# Patient Record
Sex: Female | Born: 1960 | Race: White | Hispanic: No | Marital: Married | Smoking: Former smoker
Health system: Southern US, Community
[De-identification: ages and names within clinical notes are randomized; demographics above are authoritative.]

## PROBLEM LIST (undated history)

## (undated) DIAGNOSIS — F419 Anxiety disorder, unspecified: Secondary | ICD-10-CM

## (undated) DIAGNOSIS — F32A Anxiety disorder, unspecified: Secondary | ICD-10-CM

## (undated) DIAGNOSIS — G4730 Sleep apnea, unspecified: Secondary | ICD-10-CM

## (undated) DIAGNOSIS — I3139 Other pericardial effusion (noninflammatory): Secondary | ICD-10-CM

## (undated) DIAGNOSIS — T7840XA Allergy, unspecified, initial encounter: Secondary | ICD-10-CM

## (undated) DIAGNOSIS — K591 Functional diarrhea: Secondary | ICD-10-CM

## (undated) DIAGNOSIS — E041 Nontoxic single thyroid nodule: Secondary | ICD-10-CM

## (undated) DIAGNOSIS — E669 Obesity, unspecified: Secondary | ICD-10-CM

## (undated) DIAGNOSIS — G43909 Migraine, unspecified, not intractable, without status migrainosus: Secondary | ICD-10-CM

## (undated) DIAGNOSIS — Z9889 Other specified postprocedural states: Secondary | ICD-10-CM

## (undated) DIAGNOSIS — J449 Chronic obstructive pulmonary disease, unspecified: Secondary | ICD-10-CM

## (undated) DIAGNOSIS — K589 Irritable bowel syndrome without diarrhea: Secondary | ICD-10-CM

## (undated) DIAGNOSIS — S76309A Unspecified injury of muscle, fascia and tendon of the posterior muscle group at thigh level, unspecified thigh, initial encounter: Secondary | ICD-10-CM

## (undated) DIAGNOSIS — R112 Nausea with vomiting, unspecified: Secondary | ICD-10-CM

## (undated) DIAGNOSIS — F329 Major depressive disorder, single episode, unspecified: Secondary | ICD-10-CM

## (undated) DIAGNOSIS — M5126 Other intervertebral disc displacement, lumbar region: Secondary | ICD-10-CM

## (undated) DIAGNOSIS — G709 Myoneural disorder, unspecified: Secondary | ICD-10-CM

## (undated) DIAGNOSIS — R918 Other nonspecific abnormal finding of lung field: Secondary | ICD-10-CM

## (undated) DIAGNOSIS — Z8709 Personal history of other diseases of the respiratory system: Secondary | ICD-10-CM

## (undated) DIAGNOSIS — G629 Polyneuropathy, unspecified: Secondary | ICD-10-CM

## (undated) DIAGNOSIS — Z860100 Personal history of colon polyps, unspecified: Secondary | ICD-10-CM

## (undated) DIAGNOSIS — F909 Attention-deficit hyperactivity disorder, unspecified type: Secondary | ICD-10-CM

## (undated) DIAGNOSIS — K219 Gastro-esophageal reflux disease without esophagitis: Secondary | ICD-10-CM

## (undated) DIAGNOSIS — E785 Hyperlipidemia, unspecified: Secondary | ICD-10-CM

## (undated) DIAGNOSIS — K5790 Diverticulosis of intestine, part unspecified, without perforation or abscess without bleeding: Secondary | ICD-10-CM

## (undated) DIAGNOSIS — R1312 Dysphagia, oropharyngeal phase: Secondary | ICD-10-CM

## (undated) DIAGNOSIS — E119 Type 2 diabetes mellitus without complications: Secondary | ICD-10-CM

## (undated) DIAGNOSIS — R159 Full incontinence of feces: Secondary | ICD-10-CM

## (undated) DIAGNOSIS — F410 Panic disorder [episodic paroxysmal anxiety] without agoraphobia: Secondary | ICD-10-CM

## (undated) DIAGNOSIS — D869 Sarcoidosis, unspecified: Secondary | ICD-10-CM

## (undated) DIAGNOSIS — J8410 Pulmonary fibrosis, unspecified: Secondary | ICD-10-CM

## (undated) DIAGNOSIS — F4310 Post-traumatic stress disorder, unspecified: Secondary | ICD-10-CM

## (undated) DIAGNOSIS — G2581 Restless legs syndrome: Secondary | ICD-10-CM

## (undated) DIAGNOSIS — Z800 Family history of malignant neoplasm of digestive organs: Secondary | ICD-10-CM

## (undated) HISTORY — DX: Personal history of colon polyps, unspecified: Z86.0100

## (undated) HISTORY — PX: BREAST EXCISIONAL BIOPSY: SUR124

## (undated) HISTORY — DX: Type 2 diabetes mellitus without complications: E11.9

## (undated) HISTORY — DX: Pulmonary fibrosis, unspecified: J84.10

## (undated) HISTORY — DX: Functional diarrhea: K59.1

## (undated) HISTORY — DX: Other intervertebral disc displacement, lumbar region: M51.26

## (undated) HISTORY — DX: Full incontinence of feces: R15.9

## (undated) HISTORY — DX: Irritable bowel syndrome, unspecified: K58.9

## (undated) HISTORY — DX: Family history of malignant neoplasm of digestive organs: Z80.0

## (undated) HISTORY — DX: Anxiety disorder, unspecified: F32.A

## (undated) HISTORY — DX: Unspecified injury of muscle, fascia and tendon of the posterior muscle group at thigh level, unspecified thigh, initial encounter: S76.309A

## (undated) HISTORY — DX: Obesity, unspecified: E66.9

## (undated) HISTORY — DX: Gastro-esophageal reflux disease without esophagitis: K21.9

## (undated) HISTORY — PX: CHOLECYSTECTOMY: SHX55

## (undated) HISTORY — PX: TONSILLECTOMY: SUR1361

## (undated) HISTORY — DX: Personal history of other diseases of the respiratory system: Z87.09

## (undated) HISTORY — DX: Diverticulosis of intestine, part unspecified, without perforation or abscess without bleeding: K57.90

## (undated) HISTORY — DX: Chronic obstructive pulmonary disease, unspecified: J44.9

## (undated) HISTORY — DX: Restless legs syndrome: G25.81

## (undated) HISTORY — DX: Sarcoidosis, unspecified: D86.9

## (undated) HISTORY — PX: PELVIC FLOOR REPAIR: SHX2192

## (undated) HISTORY — DX: Hyperlipidemia, unspecified: E78.5

## (undated) HISTORY — DX: Attention-deficit hyperactivity disorder, unspecified type: F90.9

## (undated) HISTORY — DX: Panic disorder (episodic paroxysmal anxiety): F41.0

## (undated) HISTORY — DX: Anxiety disorder, unspecified: F41.9

## (undated) HISTORY — DX: Post-traumatic stress disorder, unspecified: F43.10

## (undated) HISTORY — DX: Polyneuropathy, unspecified: G62.9

## (undated) HISTORY — PX: KNEE ARTHROSCOPY: SUR90

## (undated) HISTORY — DX: Sleep apnea, unspecified: G47.30

## (undated) HISTORY — DX: Other pericardial effusion (noninflammatory): I31.39

## (undated) HISTORY — DX: Dysphagia, oropharyngeal phase: R13.12

## (undated) HISTORY — DX: Allergy, unspecified, initial encounter: T78.40XA

## (undated) HISTORY — DX: Other nonspecific abnormal finding of lung field: R91.8

## (undated) HISTORY — PX: TUBAL LIGATION: SHX77

## (undated) HISTORY — DX: Nontoxic single thyroid nodule: E04.1

## (undated) HISTORY — DX: Migraine, unspecified, not intractable, without status migrainosus: G43.909

---
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
## 1987-12-29 HISTORY — PX: OTHER SURGICAL HISTORY: SHX169

## 1988-12-28 DIAGNOSIS — F411 Generalized anxiety disorder: Secondary | ICD-10-CM | POA: Insufficient documentation

## 1988-12-28 HISTORY — DX: Generalized anxiety disorder: F41.1

## 1990-12-28 HISTORY — PX: TOTAL ABDOMINAL HYSTERECTOMY: SHX209

## 1998-06-13 ENCOUNTER — Other Ambulatory Visit: Admission: RE | Admit: 1998-06-13 | Discharge: 1998-06-13 | Payer: Self-pay | Admitting: Obstetrics and Gynecology

## 2008-04-20 ENCOUNTER — Ambulatory Visit: Payer: Self-pay | Admitting: Critical Care Medicine

## 2008-04-20 DIAGNOSIS — F410 Panic disorder [episodic paroxysmal anxiety] without agoraphobia: Secondary | ICD-10-CM | POA: Insufficient documentation

## 2008-04-20 DIAGNOSIS — K219 Gastro-esophageal reflux disease without esophagitis: Secondary | ICD-10-CM | POA: Insufficient documentation

## 2008-04-20 DIAGNOSIS — J45909 Unspecified asthma, uncomplicated: Secondary | ICD-10-CM

## 2008-04-20 DIAGNOSIS — D860 Sarcoidosis of lung: Secondary | ICD-10-CM | POA: Insufficient documentation

## 2008-04-20 DIAGNOSIS — J984 Other disorders of lung: Secondary | ICD-10-CM

## 2008-04-20 DIAGNOSIS — E785 Hyperlipidemia, unspecified: Secondary | ICD-10-CM

## 2008-04-20 DIAGNOSIS — G4734 Idiopathic sleep related nonobstructive alveolar hypoventilation: Secondary | ICD-10-CM

## 2008-04-20 DIAGNOSIS — D869 Sarcoidosis, unspecified: Secondary | ICD-10-CM | POA: Insufficient documentation

## 2008-04-20 HISTORY — DX: Gastro-esophageal reflux disease without esophagitis: K21.9

## 2008-04-20 HISTORY — DX: Unspecified asthma, uncomplicated: J45.909

## 2008-04-20 HISTORY — DX: Idiopathic sleep related nonobstructive alveolar hypoventilation: G47.34

## 2008-04-20 HISTORY — DX: Hyperlipidemia, unspecified: E78.5

## 2008-04-20 HISTORY — DX: Other disorders of lung: J98.4

## 2008-04-20 LAB — CONVERTED CEMR LAB: Angiotensin 1 Converting Enzyme: 37 units/L (ref 9–67)

## 2008-04-25 ENCOUNTER — Encounter: Payer: Self-pay | Admitting: Critical Care Medicine

## 2008-04-25 ENCOUNTER — Ambulatory Visit: Admission: RE | Admit: 2008-04-25 | Discharge: 2008-04-25 | Payer: Self-pay | Admitting: Critical Care Medicine

## 2008-04-25 ENCOUNTER — Ambulatory Visit: Payer: Self-pay | Admitting: Critical Care Medicine

## 2008-04-26 ENCOUNTER — Ambulatory Visit: Payer: Self-pay | Admitting: Internal Medicine

## 2008-04-26 ENCOUNTER — Telehealth (INDEPENDENT_AMBULATORY_CARE_PROVIDER_SITE_OTHER): Payer: Self-pay | Admitting: *Deleted

## 2008-04-26 ENCOUNTER — Ambulatory Visit: Payer: Self-pay | Admitting: Critical Care Medicine

## 2008-04-26 DIAGNOSIS — R059 Cough, unspecified: Secondary | ICD-10-CM | POA: Insufficient documentation

## 2008-04-26 DIAGNOSIS — R05 Cough: Secondary | ICD-10-CM

## 2008-04-26 HISTORY — DX: Cough, unspecified: R05.9

## 2008-04-27 ENCOUNTER — Encounter: Payer: Self-pay | Admitting: Critical Care Medicine

## 2008-04-28 ENCOUNTER — Telehealth: Payer: Self-pay | Admitting: Pulmonary Disease

## 2008-05-16 ENCOUNTER — Ambulatory Visit: Payer: Self-pay | Admitting: Critical Care Medicine

## 2008-05-16 DIAGNOSIS — J64 Unspecified pneumoconiosis: Secondary | ICD-10-CM | POA: Insufficient documentation

## 2008-05-16 HISTORY — DX: Unspecified pneumoconiosis: J64

## 2008-06-11 ENCOUNTER — Ambulatory Visit: Payer: Self-pay | Admitting: Internal Medicine

## 2008-06-11 DIAGNOSIS — J309 Allergic rhinitis, unspecified: Secondary | ICD-10-CM | POA: Insufficient documentation

## 2008-06-11 HISTORY — DX: Allergic rhinitis, unspecified: J30.9

## 2008-07-25 HISTORY — PX: ESOPHAGOGASTRODUODENOSCOPY: SHX1529

## 2008-08-28 ENCOUNTER — Ambulatory Visit: Payer: Self-pay | Admitting: Internal Medicine

## 2008-09-17 ENCOUNTER — Telehealth (INDEPENDENT_AMBULATORY_CARE_PROVIDER_SITE_OTHER): Payer: Self-pay | Admitting: *Deleted

## 2008-10-15 ENCOUNTER — Ambulatory Visit (HOSPITAL_BASED_OUTPATIENT_CLINIC_OR_DEPARTMENT_OTHER): Admission: RE | Admit: 2008-10-15 | Discharge: 2008-10-15 | Payer: Self-pay | Admitting: Internal Medicine

## 2008-10-15 ENCOUNTER — Encounter: Payer: Self-pay | Admitting: Internal Medicine

## 2008-10-20 ENCOUNTER — Ambulatory Visit: Payer: Self-pay | Admitting: Internal Medicine

## 2008-10-31 ENCOUNTER — Ambulatory Visit: Payer: Self-pay | Admitting: Internal Medicine

## 2008-11-05 ENCOUNTER — Telehealth: Payer: Self-pay | Admitting: Internal Medicine

## 2008-11-11 DIAGNOSIS — G4730 Sleep apnea, unspecified: Secondary | ICD-10-CM | POA: Insufficient documentation

## 2008-11-11 HISTORY — DX: Sleep apnea, unspecified: G47.30

## 2008-12-07 ENCOUNTER — Encounter: Payer: Self-pay | Admitting: Internal Medicine

## 2008-12-19 ENCOUNTER — Encounter: Payer: Self-pay | Admitting: Internal Medicine

## 2009-01-13 ENCOUNTER — Encounter: Payer: Self-pay | Admitting: Internal Medicine

## 2009-04-30 ENCOUNTER — Ambulatory Visit: Payer: Self-pay | Admitting: Internal Medicine

## 2009-07-11 ENCOUNTER — Telehealth (INDEPENDENT_AMBULATORY_CARE_PROVIDER_SITE_OTHER): Payer: Self-pay | Admitting: *Deleted

## 2009-07-16 ENCOUNTER — Ambulatory Visit: Payer: Self-pay | Admitting: Internal Medicine

## 2010-05-10 ENCOUNTER — Encounter: Payer: Self-pay | Admitting: Internal Medicine

## 2010-07-01 ENCOUNTER — Telehealth (INDEPENDENT_AMBULATORY_CARE_PROVIDER_SITE_OTHER): Payer: Self-pay | Admitting: *Deleted

## 2010-11-28 ENCOUNTER — Encounter: Payer: Self-pay | Admitting: Internal Medicine

## 2010-12-10 ENCOUNTER — Encounter: Payer: Self-pay | Admitting: Internal Medicine

## 2010-12-15 ENCOUNTER — Encounter: Payer: Self-pay | Admitting: Internal Medicine

## 2010-12-25 ENCOUNTER — Ambulatory Visit: Admit: 2010-12-25 | Payer: Self-pay | Attending: Internal Medicine | Admitting: Internal Medicine

## 2011-01-29 NOTE — Progress Notes (Signed)
Summary: results - LMTCB x1  Phone Note From Other Clinic Call back at [phone]   Caller: Dr. Terrilee Croak Office - [facility]ash Call For: young Summary of Call: looking for path report from biopsy or bronch.  Please fax to # [phone] Initial call taken by: Eugene Gavia,  July 01, 2010 12:00 PM  Follow-up for Phone Call        last path report in echart is from 2009.  is this what she's requesting.  LM at Dr. Terrilee Croak office TCB. Boone Master CNA/MA  July 01, 2010 12:31 PM   Additional Follow-up for Phone Call Additional follow up Details #1::        path report from 2009 bronch faxed to donna at dr chodri's office--fax #[phone] Additional Follow-up by: Philipp Deputy CMA,  July 02, 2010 11:26 AM

## 2011-01-29 NOTE — Miscellaneous (Signed)
Summary: Routine Allergy Skin Test/[facility]  Routine Allergy Skin Test/[facility]   Imported By: Esmeralda Links D'jimraou 06/26/2008 11:09:56  _____________________________________________________________________  External Attachment:    Type:   Image     Comment:   External Document

## 2011-01-29 NOTE — Miscellaneous (Signed)
Summary: Bronchoscopy  Clinical Lists Changes  Observations: Added new observation of BRONCHOSCOPY: Essentially normal upper airway and lower airway exam.  (04/25/2008 16:14)       Bronchoscopy  Procedure date:  04/25/2008  Findings:      Essentially normal upper airway and lower airway exam.   Comments:      Specimen - Transbronchial Biopsy : Location:[facility]  Location:[facility] FINAL DIAGNOSIS   ***MICROSCOPIC EXAMINATION AND DIAGNOSIS***   LUNG, RMLNRUL, TRANSBRONCHIAL BIOPSIES:  - BENIGN LUNG PARENCHYMA WITH GRANULOMATOUS INFLAMMATION.  - THERE IS NO EVIDENCE OF MALIGNANCY. - SEE COMMENT.   COMMENT The specimen contains multiple biopsy fragments of benign lung parenchyma with noncaseating granulomatous inflammation within the interstitium.  GMS, PAS fungal, and AFB stains are negative for the presence of microorganisms.  The histologic differential diagnosis includes pneumoconiosis and less likely sarcoidosis.  Clinical and radiologic correlation is recommended. (JBK:mj 04/26/08)   Appended Document: Bronchoscopy pt aware of results.  All micro data neg to date.  AFB and fungal stains are neg.  Cults are pending.  I called in prednisone 10mg  Take 4 by moutth daily for 4 days then 3 by mouth daily for 4 days then 2 by mouth daily for 4 days then 1 by mouth daily for 4 days then stop

## 2011-01-29 NOTE — Assessment & Plan Note (Signed)
Summary: follow up///JJ   Copy to:  Jonni Sanger Primary Provider/Referring Provider:  Dr. Joetta Manners  CC:  Pt is here for a f/u appt.  Pt states she is doing good on cpap.  pt c/o increased sob and coughing up "thick clear sputum."  .  History of Present Illness:  10/31/08- Allergic rhinitis, asthma, sleep apnea. 1)Comes to f/u NPSG 10/15/08- mild OSA, 7/hrDr. Jennelle Human changed Concerta to Nuvigil 150 mg qd.. Naps. Works weekends, nights rotation.45-60 min daily. Educated sleep hygiene. 2)Rhinitis- Singulair restarted for Fall season. Effective and doing well.  04/30/09- Allergic rhinitis, asthma. OSA  ped cough. still coughing some but clear.Using Flonase. Also started  Allegra- we discussed comparison antinhistamines.  [place] Flow around 450 OSA- Still on Auto but was titrated to 10.  Aware of reflux.  07/16/09 Allergic rhinitis, asthma, OSA OSA- We changed CPAP to fixed at 10. Snores through CPAP, waking tired  Mask leaking. sleeps on side.  Asthma- We dropped Advair  back to 250. Notes more cough, frothy sputm. using rescue inhaler 1-3 x/day/      Current Medications (verified): 1)  Protonix 40 Mg  Tbec (Pantoprazole Sodium) .Marland Kitchen.. 1 By Mouth Daily 2)  Proair Hfa 108 (90 Base) Mcg/act  Aers (Albuterol Sulfate) .Marland Kitchen.. 1-2 Puffs Every 4-6 Hours As Needed Sob 3)  Advair Diskus 250-50 Mcg/dose Misc (Fluticasone-Salmeterol) .... Inhale 1 Puff Two Times A Day 4)  Lamictal 100 Mg  Tabs (Lamotrigine) .... 2 and 1/2 Tabs By Mouth Daily 5)  Multivitamins   Tabs (Multiple Vitamin) .Marland Kitchen.. 1 By Mouth Daily 6)  Flonase 50 Mcg/act  Susp (Fluticasone Propionate) .... Two Puffs Each Nostril Daily As Needed 7)  Allegra 180 Mg Tabs (Fexofenadine Hcl) .... Take 1 By Mouth Once Daily 8)  Cpap 10 Gentiva 9)  Citalopram Hydrobromide 20 Mg Tabs (Citalopram Hydrobromide) .... Take 1 Tablet By Mouth Once A Day 10)  Intuniv 2 Mg Xr24h-Tab (Guanfacine Hcl) .... Take 1 Tablet By Mouth Once A Day 11)  Medrol (Pak) 4 Mg  Tabs (Methylprednisolone) .... Take As Directed 12)  Vitamin D 1000 Unit Tabs (Cholecalciferol) .... Take 1 Tablet By Mouth Once A Day  Allergies (verified): 1)  ! Oxycodone Hcl  Past History:  Past Surgical History: Last updated: 04/20/2008 breast bx benign 1985/2006 Total Abdominal Hysterectomy 1992 Knee Arthroscopy times 3 Tonsillectomy Cholecystectomy  Family History: Last updated: 09/18/2008 Family History COPD father and mother Family History Asthma  two sisters Family History Sleep Apnea- sister mother alive age 5--arthritis, anxiety, chronic depression father alive age 39--cardiomegaly,etoh abuse 1 sibling alive age 24--HBP, morbid obestiy 1 sibling alive age 13--seasonal allergies, depression 1 sibling alive age 57--HBP, hyperchol 1 sibling alive age 59--seasonal allergies, food allergies, anxiety,arthritis  Social History: Last updated: 09/18/2008 Patient states former smoker.   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 Meredith Staggers PTSD PANIC DISORDER (ICD-300.01) GERD (ICD-530.81) Hyperlipidemia Asthma Sleep Apnea- NPSG 7/hr 10/15/08 Allergic Rhinitis-- skin test  Review of Systems      See HPI  The patient denies anorexia, fever, vision loss, decreased hearing, hoarseness, chest pain, syncope, dyspnea on exertion, peripheral edema, prolonged cough, headaches, hemoptysis, abdominal pain, melena, hematochezia, and severe indigestion/heartburn.    Vital Signs:  Patient profile:  50 year old female Height:      65 inches Weight:      200.50 pounds BMI:     33.49 O2 Sat:      95 % on Room air Pulse rate:   86 / minute BP sitting:   116 / 76  (left arm) Cuff size:   large  Vitals Entered By: Arman Filter  LPN (July 16, 2009 9:00 AM)  O2 Flow:  Room air CC: Pt is here for a f/u appt.  Pt states she is doing good on cpap.  pt c/o increased sob and coughing up "thick clear sputum."   Comments Medications reviewed with patient Arman Filter LPN  July 16, 2009 9:00 AM    Physical Exam  Additional Exam:  General: A/Ox3; pleasant and cooperative, NAD, SKIN: no rash, lesions NODES: no lymphadenopathy HEENT: [place]/AT, EOM- WNL, Conjuctivae- clear, PERRLA, TM-WNL, Nose- clear, Throat- clear and wnl NECK: Supple w/ fair ROM, JVD- none, normal carotid impulses w/o bruits Thyroid-  CHEST: Clear to P&A, good airflow wit no cough HEART: RRR, no m/g/r heard ABDOMEN:  ION:GEXB, nl pulses, no edema  NEURO: Grossly intact to observation      Impression & Recommendations:  Problem # 1:  OTHER DISEASES OF LUNG NOT ELSEWHERE CLASSIFIED (ICD-518.89)  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 Had CT in April at [facility] and told no difference. Wilfrid Lund get that report for our file. Doesn't sound as if that would be the basis for change in cough.  Problem # 2:  ASTHMA (ICD-493.90)  Increased asthma, responsive to inhalers. Would like to increase antiinflammatoryu med. Will retry Singulair,  but may need to go back up on Advair. Her updated medication list for this problem includes:    Flonase 50 Mcg/act Susp (Fluticasone propionate) .Marland Kitchen..Marland Kitchen Two puffs each nostril daily as needed    Allegra 180 Mg Tabs (Fexofenadine hcl) .Marland Kitchen... Take 1 by mouth once daily  Orders: Est. Patient Level III (28413)  Problem # 3:  SLEEP APNEA (ICD-780.57)  Sounds as if she isn't sleeping well enough, and mask leaks some. We will try increasing pressure from 10 to 11 and refit mask. She may need to work with day-time sleep center staff.  Orders: Est. Patient Level III (24401) DME Referral (DME)  Medications Added to Medication List This Visit: 1)  Advair Diskus 250-50 Mcg/dose Misc  (Fluticasone-salmeterol) .... Inhale 1 puff two times a day 2)  Cpap 11 Hme  3)  Citalopram Hydrobromide 20 Mg Tabs (Citalopram hydrobromide) .... Take 1 tablet by mouth once a day 4)  Intuniv 2 Mg Xr24h-tab (Guanfacine hcl) .... Take 1 tablet by mouth once a day 5)  Medrol (pak) 4 Mg Tabs (Methylprednisolone) .... Take as directed 6)  Vitamin D 1000 Unit Tabs (Cholecalciferol) .... Take 1 tablet by mouth once a day 7)  Singulair 10 Mg Tabs (Montelukast sodium) .Marland Kitchen.. 1 daily  Patient Instructions: 1)  Please schedule a follow-up appointment in 4 months. 2)  We will contact your home care company to raise cpap to 11 and work with you on mask fit. 3)  Try Singulair- script and coupon, 1 daily - to see if cough and asthma are better. 4)  We need CT report from [place]. Prescriptions: SINGULAIR 10 MG TABS (MONTELUKAST SODIUM) 1 daily  #30 x prn   Entered and Authorized by:   Waymon Budge MD   Signed by:   Waymon Budge MD on 07/16/2009  Method used:   Print then Give to Patient   RxID:   [id number]

## 2011-01-29 NOTE — Progress Notes (Signed)
Summary: Patient wants to try cpap.  Phone Note Call from Patient Call back at Home Phone [phone]   Caller: Patient Call For: Dylann Layne Reason for Call: Talk to Nurse Summary of Call: pt would like to try cpap on a trial basis.   Initial call taken by: Eugene Gavia,  November 05, 2008 12:01 PM  Follow-up for Phone Call        Discussed trial of CPAP at [facility] on 10/31/08.  Pt wishes to try.  Please send order for. Thanks  Follow-up by: Cloyde Reams RN,  November 05, 2008 12:05 PM  Additional Follow-up for Phone Call Additional follow up Details #1::        Will try cpap via autotitration. Additional Follow-up by: Waymon Budge MD,  November 11, 2008 11:32 AM

## 2011-01-29 NOTE — Assessment & Plan Note (Signed)
Summary: 6 month return/mh   Copy to:  Jonni Sanger Primary Provider/Referring Provider:  Dr. Joetta Manners  CC:  follow up visit-asthma..  History of Present Illness: Current Problems:  SLEEP APNEA (ICD-780.57) ALLERGIC RHINITIS (ICD-477.9) PNEUMOCONIOSIS (ICD-505) COUGH (ICD-786.2) OTHER DISEASES OF LUNG NOT ELSEWHERE CLASSIFIED (ICD-518.89) ASTHMA (ICD-493.90) HYPERLIPIDEMIA (ICD-272.4) BIPOLAR DISORDER UNSPECIFIED (ICD-296.80) PANIC DISORDER (ICD-300.01) GERD (ICD-530.81)  From 08/28/08- 50 yo woman returning for f/u of allergic rhinitis and asthma. Originally consulted by Dr. Delford Field and was skin tested. She will start back on Singulair routinely for the Fall season. Has noted some sinus and ear fullness, itching and scratching. Chest has stayed clear.  Now husband complains of snoring x 3 months.  She is waking with morning headache and feels unrestecd. Dr. Jennelle Human changed dx from Bipolar to ADD with PTSD and started Vyanse 30 mg daily. A sister is morbidly obese and has sleep apnea.  10/31/08- Allergic rhinitis, asthma, sleep apnea. 1)Comes to f/u NPSG 10/15/08- mild OSA, 7/hrDr. Jennelle Human changed Concerta to Nuvigil 150 mg qd.. Naps. Works weekends, nights rotation.45-60 min daily. Educated sleep hygiene. 2)Rhinitis- Singulair restarted for Fall season. Effective and doing well.  04/30/09- Allergic rhinitis, asthma. OSA  ped cough. still coughing some but clear.Using Flonase. Also started  Allegra- we discussed comparison antinhistamines.  [place] Flow around 450 OSA- Still on Auto but was titrated to 10.  Aware of reflux.      Current Medications (verified): 1)  Protonix 40 Mg  Tbec (Pantoprazole Sodium) .Marland Kitchen.. 1 By Mouth Daily 2)  Proair Hfa 108 (90 Base) Mcg/act  Aers (Albuterol Sulfate) .Marland Kitchen.. 1-2 Puffs Every 4-6 Hours As Needed Sob 3)  Advair Diskus 500-50 Mcg/dose Misc (Fluticasone-Salmeterol) .Marland Kitchen.. 1 Puff Two Times A Day and Rinse Mouth Well After Use 4)  Lamictal 100 Mg  Tabs  (Lamotrigine) .... 2 and 1/2 Tabs By Mouth Daily 5)  Multivitamins   Tabs (Multiple Vitamin) .Marland Kitchen.. 1 By Mouth Daily 6)  Flonase 50 Mcg/act  Susp (Fluticasone Propionate) .... Two Puffs Each Nostril Daily As Needed 7)  Allegra 180 Mg Tabs (Fexofenadine Hcl) .... Take 1 By Mouth Once Daily  Allergies (verified): 1)  ! Oxycodone Hcl  Past History:  Family History:    Family History COPD father and mother    Family History Asthma  two sisters    Family History Sleep Apnea- sister    mother alive age 27--arthritis, anxiety, chronic depression    father alive age 16--cardiomegaly,etoh abuse    1 sibling alive age 26--HBP, morbid obestiy    1 sibling alive age 73--seasonal allergies, depression    1 sibling alive age 51--HBP, hyperchol    1 sibling alive age 79--seasonal allergies, food allergies, anxiety,arthritis     (09/18/2008)  Social History:    Patient states former smoker.   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  Past Medical History:    Reviewed history from 10/31/2008 and no changes required:    ADD- Dr. Meredith Staggers    PTSD    PANIC DISORDER (ICD-300.01)    GERD (ICD-530.81)    Hyperlipidemia    Asthma    Sleep Apnea- NPSG 7/hr 10/15/08    Allergic Rhinitis  Past Surgical History:    Reviewed history from 04/20/2008 and no changes required:    breast bx benign  1985/2006    Total Abdominal Hysterectomy 1992    Knee Arthroscopy times 3    Tonsillectomy    Cholecystectomy  Family History:    Reviewed history from 09/18/2008 and no changes required:       Family History COPD father and mother       Family History Asthma  two sisters       Family History Sleep Apnea- sister       mother alive age 68--arthritis, anxiety, chronic depression       father alive age 9--cardiomegaly,etoh abuse       1 sibling alive age 37--HBP, morbid obestiy       1 sibling alive age 61--seasonal allergies, depression       1 sibling alive age 28--HBP, hyperchol       1 sibling alive age 75--seasonal allergies, food allergies, anxiety,arthritis  Social History:    Reviewed history from 09/18/2008 and no changes required:       Patient states former smoker.   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    Vital Signs:  Patient profile:   50 year old female Height:      65 inches Weight:      200.50 pounds BMI:     33.49 Pulse rate:   97 / minute BP sitting:   108 / 64  (left arm) Cuff size:   large  Vitals Entered By: Reynaldo Minium CMA (Apr 30, 2009 10:26 AM)  O2 Sat on room air at rest %:  98 CC: follow up visit-asthma. Comments Medications reviewed with patient Reynaldo Minium CMA  Apr 30, 2009 10:27 AM    Physical Exam  Additional Exam:  General: A/Ox3; pleasant and cooperative, NAD, SKIN: no rash, lesions NODES: no lymphadenopathy HEENT: [place]/AT, EOM- WNL, Conjuctivae- clear, PERRLA, TM-WNL, Nose- clear, Throat- clear and wnl NECK: Supple w/ fair ROM, JVD- none, normal carotid impulses w/o bruits Thyroid-  CHEST:  Clear to P&A, good airflow wit no cough HEART: RRR, no m/g/r heard ABDOMEN:  YNW:GNFA, nl pulses, no edema  NEURO: Grossly intact to observation      Impression & Recommendations:  Problem # 1:  SLEEP APNEA (ICD-780.57)  Will change cpap to fixed 10.  Problem # 2:  ASTHMA (ICD-493.90) Bad allergy  season. Cautioned a bout reflux. Consider allergy vaccine based on our previous skin testing. Try zyrtec since allegra not helping When advair 500 used up, she will drop back to 250.  Medications Added to Medication List This Visit: 1)  Advair Diskus 500-50 Mcg/dose Misc (Fluticasone-salmeterol) .Marland Kitchen.. 1 puff two times a day and rinse mouth well after use 2)  Allegra 180 Mg Tabs (Fexofenadine hcl) .... Take 1 by mouth once daily 3)  Cpap 10 Gentiva   Other Orders: Est. Patient Level III (24401) DME Referral (DME)  Patient Instructions: 1)  Please schedule a follow-up appointment in 1 month. 2)  When current Advair 500 is used up, try dropping back to 250 3)  OK to experiment with different antihistamines, like zyrtec, or even to combine them if needed  4)  We can talk about allergy vaccine if you continue to have problems 5)  We are contacting your home care company to change CPAP to fixed pressure at 10.

## 2011-01-29 NOTE — Assessment & Plan Note (Signed)
Summary: rov 2 wks after sleep study///kp   Referred by:  Jonni Sanger PCP:  Dr. Joetta Manners  Chief Complaint:  2 week follow up visit after Sleep Study .  History of Present Illness: .Current Problems:  ? of SLEEP APNEA (ICD-780.57) ALLERGIC RHINITIS (ICD-477.9) PNEUMOCONIOSIS (ICD-505) COUGH (ICD-786.2) OTHER DISEASES OF LUNG NOT ELSEWHERE CLASSIFIED (ICD-518.89) ASTHMA (ICD-493.90) HYPERLIPIDEMIA (ICD-272.4) BIPOLAR DISORDER UNSPECIFIED (ICD-296.80) PANIC DISORDER (ICD-300.01) GERD (ICD-530.81)  From 08/28/08- 50 yo woman returning for f/u of allergic rhinitis and asthma. Originally consulted by Dr. Delford Field and was skin tested. She will start back on Singulair routinely for the Fall season. Has noted some sinus and ear fullness, itching and scratching. Chest has stayed clear.  Now husband complains of snoring x 3 months.  She is waking with morning headache and feels unrestecd. Dr. Jennelle Human changed dx from Bipolar to ADD with PTSD and started Vyanse 30 mg daily. A sister is morbidly obese and has sleep apnea.  10/31/08- Allergic rhinitis, asthma, sleep apnea. 1)Comes to f/u NPSG 10/15/08- mild OSA, 7/hrDr. Jennelle Human changed Concerta to Nuvigil 150 mg qd.. Naps. Works weekends, nights rotation.45-60 min daily. Educated sleep hygiene. 2)Rhinitis- Singulair restarted for Fall season. Effective and doing well.        Prior Medications Reviewed Using: Patient Recall  Updated Prior Medication List: PROTONIX 40 MG  TBEC (PANTOPRAZOLE SODIUM) 1 by mouth daily PROAIR HFA 108 (90 BASE) MCG/ACT  AERS (ALBUTEROL SULFATE) 1-2 puffs every 4-6 hours as needed sob ADVAIR DISKUS 250-50 MCG/DOSE  MISC (FLUTICASONE-SALMETEROL) 1 puff two times a day SINGULAIR 10 MG  TABS (MONTELUKAST SODIUM) 1 by mouth at bedtime LAMICTAL 100 MG  TABS (LAMOTRIGINE) 2 and 1/2 tabs by mouth daily MULTIVITAMINS   TABS (MULTIPLE VITAMIN) 1 by mouth daily FLONASE 50 MCG/ACT  SUSP (FLUTICASONE PROPIONATE) Two puffs each  nostril daily as needed NUVIGIL 150 MG TABS (ARMODAFINIL) take 1 by mouth  every morning TOVIAZ 4 MG XR24H-TAB (FESOTERODINE FUMARATE) take 1 by mouth  every morning  Current Allergies (reviewed today): ! OXYCODONE HCL  Past Medical History:    Reviewed history from 08/28/2008 and no changes required:       ADD- Dr. Meredith Staggers       PTSD       PANIC DISORDER (ICD-300.01)       GERD (ICD-530.81)       Hyperlipidemia       Asthma       Sleep Apnea- NPSG 7/hr 10/15/08       Allergic Rhinitis   Social History:    Reviewed history from 09/18/2008 and no changes required:       Patient states former smoker.   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     Vital Signs:  Patient Profile:   50 Years Old Female Weight:      202.38 pounds O2 Sat:      98 % O2 treatment:    Room Air Pulse rate:  84 / minute BP sitting:   122 / 80  (right arm) Cuff size:   regular  Vitals Entered By: Reynaldo Minium CMA (October 31, 2008 11:14 AM)             Comments Medications reviewed with patient Reynaldo Minium CMA  October 31, 2008 11:15 AM      Physical Exam  General: A/Ox3; pleasant and cooperative, NAD, SKIN: no rash, lesions NODES: no lymphadenopathy HEENT: [place]/AT, EOM- WNL, Conjuctivae- clear, PERRLA, TM-WNL, Nose- clear, Throat- clear and wnl NECK: Supple w/ fair ROM, JVD- none, normal carotid impulses w/o bruits Thyroid- normal to palpation CHEST: Clear to P&A HEART: RRR, no m/g/r heard ABDOMEN: Soft and nl; nml bowel sounds; no organomegaly or masses noted MVH:QION, nl pulses, no edema  NEURO: Grossly intact to observation         Problem # 1:  SLEEP APNEA (ICD-780.57) Very mild  OSA w/ little neeed for active intervention. Discussed weight, side sleep, driving, rotating shifts..Doubt this is enough to explain the problems addressed by Dr. Jennelle Human, so will assist only as needed. Consider cpap only if seems worth it later.  Problem # 2:  ALLERGIC RHINITIS (ICD-477.9) Environmental and pharma approaches reviewed.  Her updated medication list for this problem includes:    Flonase 50 Mcg/act Susp (Fluticasone propionate) .Marland Kitchen..Marland Kitchen Two puffs each nostril daily as needed   Problem # 3:  ASTHMA (ICD-493.90) Refilled Advair Her updated medication list for this problem includes:    Proair Hfa 108 (90 Base) Mcg/act Aers (Albuterol sulfate) .Marland Kitchen... 1-2 puffs every 4-6 hours as needed sob    Advair Diskus 250-50 Mcg/dose Misc (Fluticasone-salmeterol) .Marland Kitchen... 1 puff two times a day    Singulair 10 Mg Tabs (Montelukast sodium) .Marland Kitchen... 1 by mouth at bedtime   Medications Added to Medication List This Visit: 1)  Nuvigil 150 Mg Tabs (Armodafinil) .... Take 1 by mouth  every morning 2)  Zoloft 50 Mg Tabs (Sertraline hcl) .Marland Kitchen.. 1 daily 3)  Toviaz 4 Mg Xr24h-tab (Fesoterodine fumarate) .... Take 1 by mouth  every morning   Patient Instructions: 1)  Please schedule a follow-up appointment in 6 months. 2)  Consider a trial of CPAP 3)  Meds refilled   Prescriptions: FLONASE 50 MCG/ACT  SUSP (FLUTICASONE PROPIONATE) Two puffs each nostril daily as needed  #1 x prn   Entered and Authorized by:   Waymon Budge MD   Signed by:   Waymon Budge MD on 10/31/2008   Method used:   Electronically to        [facility] [address] [phone]* (retail)       [address]       [place], [place]  [address]       Ph: [phone] or [phone]       Fax: [phone]   RxID:   [id number] ADVAIR DISKUS 250-50 MCG/DOSE  MISC (FLUTICASONE-SALMETEROL) 1 puff two times a day  #1 x prn   Entered and Authorized by:   Waymon Budge MD   Signed by:   Waymon Budge MD on  10/31/2008   Method used:   Electronically to        [facility] [address] [phone]* (retail)       [address]       [place], [place]  [address]  Ph: [phone] or [phone]       Fax: [phone]   RxID:   [email or web address]  ]

## 2011-01-29 NOTE — Letter (Signed)
Summary: [facility]CPAP Equipment/[facility]  [facility]AP [facility]   Imported By: Esmeralda Links D'jimraou 12/20/2008 13:13:37  _____________________________________________________________________  External Attachment:    Type:   Image     Comment:   External Document

## 2011-01-29 NOTE — Assessment & Plan Note (Signed)
Summary: ALLERGY CONSULT/[facility]   Vital Signs:  Patient Profile:   50 Years Old Female Weight:      204.56 pounds O2 Sat:      97 % O2 treatment:    Room Air Pulse rate:   70 / minute BP sitting:   112 / 78  (left arm) Cuff size:   regular  Vitals Entered By: Reynaldo Minium CMA (June 11, 2008 9:39 AM)             Comments Medications reviewed with patient Reynaldo Minium CMA  June 11, 2008 9:40 AM      Visit Type:  Initial Consult Referred by:  Jonni Sanger PCP:  Dr. Joetta Manners  Chief Complaint:  allergy consult.  History of Present Illness: Current Problems:  PNEUMOCONIOSIS (ICD-505) COUGH (ICD-786.2) OTHER DISEASES OF LUNG NOT ELSEWHERE CLASSIFIED (ICD-518.89) ASTHMA (ICD-493.90) HYPERLIPIDEMIA (ICD-272.4) BIPOLAR DISORDER UNSPECIFIED (ICD-296.80) PANIC DISORDER (ICD-300.01) GERD (ICD-72.65)  50 year old woman, referred courtesy of Dr. Shan Levans in allergy consultation.  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 she complains of dyspnea on over the past 6 years and especially over the past year, increased by exercise, heat, and chemicals.  Singulair helps.  Outdoors in early spring, starting late February.  Each year, eyes itch., sneeze, dry cough, ears feel full, and she feels fatigued.  Similar, milder flare in the fall.  Each year.  No prior allergy evaluation.  Flonase nasal spray helps.  History of urticaria, random.  Sensitive to latex contact with swelling, itching, burning of skin.  Aspirin causes malaise, and GI pain. 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  Lives in a house with basement, propane/electrical heat, outdoor cat, nonsmokers, hardwood with no carpet, they change air conditioning filters regularly.  No feather bedding.  No encasings on bedding.  Skin testing: appropriate controls.  Significant positive reactions especially for grass, and tree pollen, groups, dust  mites, and molds.      Current Allergies (reviewed today): ! OXYCODONE HCL  Past Medical History:    Reviewed history from 04/20/2008 and no changes required:              BIPOLAR DISORDER UNSPECIFIED (ICD-296.80)       PANIC DISORDER (ICD-300.01)       GERD (ICD-530.81)       Hyperlipidemia       Asthma              Current Meds:        PROTONIX 40 MG  TBEC (PANTOPRAZOLE SODIUM) 1 by mouth daily       PROAIR HFA 108 (90 BASE) MCG/ACT  AERS (ALBUTEROL SULFATE) 1-2 puffs every 4-6 hours as needed sob       ADVAIR DISKUS 250-50 MCG/DOSE  MISC (FLUTICASONE-SALMETEROL) 1 puff two times a day       PROZAC 10 MG  TABS (FLUOXETINE HCL) 3 tabs by mouth daily       SINGULAIR 10 MG  TABS (MONTELUKAST SODIUM) 1 by mouth at bedtime       LAMICTAL 100 MG  TABS (LAMOTRIGINE) 2 and 1/2 tabs by mouth daily       ZANTAC 150 MG  TABS (RANITIDINE HCL) 1 by mouth at bedtime       FLAXSEED OIL 1200 MG  CAPS (FLAXSEED (LINSEED)) 1 by mouth at bedtime       MULTIVITAMINS   TABS (MULTIPLE VITAMIN) 1 by mouth daily  MAGNESIUM OXIDE 400 MG  CAPS (MAGNESIUM OXIDE) 1 by mouth at bedtime       FISH OIL 1200 MG  CAPS (OMEGA-3 FATTY ACIDS) 1 by mouth daily       GNP CINNAMON 500 MG  CAPS (CINNAMON) 1 by mouth two times a day       CLONAZEPAM 0.5 MG  TABS (CLONAZEPAM) 1/2-1 two times a day as needed       HYDROXYZINE HCL 10 MG  TABS (HYDROXYZINE HCL) 1-2 three times a day as needed       FLONASE 50 MCG/ACT  SUSP (FLUTICASONE PROPIONATE) Two puffs each nostril daily as needed              Allergies: OXYCODONE HCL.    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 NECK:  Supple w/ fair ROM; no JVD; normal carotid impulses w/o bruits; no thyromegaly or nodules palpated; no lymphadenopathy. CHEST: Coarse BS with faint exp wheezing HEART:  RRR, no m/r/g  heard ABDOMEN:  Soft & nt; nml bowel sounds; no organomegaly or masses detected. EXT: Warm bilat,  no calf pain, edema, clubbing, pulses intact Skin: no rash/lesion      Impression & Recommendations:  Problem # 1:  ALLERGIC RHINITIS (ICD-477.9) Seasonal and perennial allergic rhinitis by history.  Possible additional asthma component.Sensitive to nonspecific irritant  inhalants. we discussed environmental precautions, including an encasings for the bedding, antihistamines and decongestants.  She continues Flonase nasal spray, seasonally.  Says she is feeling comfortable now, we agreed that she would return to see me p.r.n..  We can expand the use of nasal sprays.  We discussed the possible availability of allergy vaccine in the future. Her updated medication list for this problem includes:    Flonase 50 Mcg/act Susp (Fluticasone propionate) .Marland Kitchen..Marland Kitchen Two puffs each nostril daily as needed   Problem # 2:  PNEUMOCONIOSIS (ICD-505) Assessment: Comment Only   Patient Instructions: 1)  Please schedule a follow-up appointment as needed for allergy probems 2)  Consider encasings and the allergen avoidance measures we discussed 3)  Claritin/ loratadine would be a good  antihistamine- nondrowsy 4)  Ok to use [employer] as needed 5)  We can look at other meds or consider allergy vaccine in the future if needed   ]

## 2011-01-29 NOTE — Miscellaneous (Signed)
Summary: Update meds and problem list  Clinical Lists Changes  Problems: Added new problem of GERD (ICD-530.81) Added new problem of PANIC DISORDER (ICD-300.01) Added new problem of BIPOLAR DISORDER UNSPECIFIED (ICD-296.80) Medications: Added new medication of PROTONIX 40 MG  TBEC (PANTOPRAZOLE SODIUM) 1 by mouth daily Added new medication of PROAIR HFA 108 (90 BASE) MCG/ACT  AERS (ALBUTEROL SULFATE) 1-2 puffs every 4-6 hours as needed sob Added new medication of ADVAIR DISKUS 250-50 MCG/DOSE  MISC (FLUTICASONE-SALMETEROL) 1 puff two times a day Added new medication of PROZAC 10 MG  TABS (FLUOXETINE HCL) 3 tabs by mouth daily Added new medication of SINGULAIR 10 MG  TABS (MONTELUKAST SODIUM) 1 by mouth at bedtime Added new medication of LAMICTAL 100 MG  TABS (LAMOTRIGINE) 2 and 1/2 tabs by mouth daily Added new medication of ZANTAC 150 MG  TABS (RANITIDINE HCL) 1 by mouth at bedtime

## 2011-01-29 NOTE — Miscellaneous (Signed)
Summary: Prednisone Rx  Clinical Lists Changes  Medications: Added new medication of PREDNISONE 10 MG  TABS (PREDNISONE) Take as directed Take 4 by moutth daily for 4 days then 3 by mouth daily for 4 days then 2 by mouth daily for 4 days then 1 by mouth daily for 4 days then stop - Signed Rx of PREDNISONE 10 MG  TABS (PREDNISONE) Take as directed Take 4 by moutth daily for 4 days then 3 by mouth daily for 4 days then 2 by mouth daily for 4 days then 1 by mouth daily for 4 days then stop;  #40 x 0;  Signed;  Entered by: Storm Frisk MD;  Authorized by: Storm Frisk MD;  Method used: Electronic    Prescriptions: PREDNISONE 10 MG  TABS (PREDNISONE) Take as directed Take 4 by moutth daily for 4 days then 3 by mouth daily for 4 days then 2 by mouth daily for 4 days then 1 by mouth daily for 4 days then stop  #40 x 0   Entered and Authorized by:   Storm Frisk MD   Signed by:   Storm Frisk MD on 04/27/2008   Method used:   Electronically sent to ...       [facility] [address] [phone]       [address]       [place], [place]  [address]       Ph: [phone] or [phone]       Fax: [phone]   RxID:   [id number]

## 2011-01-29 NOTE — Assessment & Plan Note (Signed)
Summary: SNORING CONSIDERING SLEEP STUDY   Visit Type:  Follow-up Referred by:  Jonni Sanger PCP:  Dr. Joetta Manners  Chief Complaint:  snoring- restlessness; jerking at night; pale when wakes up; Spouse states that pt pauses in breathing when sleeping(never times it).  History of Present Illness: 50 yo woman returning for f/u of allergic rhinitis and asthma. Originally consulted by Dr. Delford Field and was skin tested. She will start back on Singulair routinely for the Fall season. Has noted some sinus and ear fullness, itching and scratching. Chest has stayed clear.  Now husband complains of snoring x 3 months.  She is waking with morning headache and feels unrestecd. Dr. Jennelle Human changed dx from Bipolar to ADD with PTSD and started Vyanse 30 mg daily. A sister is morbidly obese and has sleep apnea.      Prior Medications Reviewed Using: Patient Recall  Updated Prior Medication List: PROTONIX 40 MG  TBEC (PANTOPRAZOLE SODIUM) 1 by mouth daily PROAIR HFA 108 (90 BASE) MCG/ACT  AERS (ALBUTEROL SULFATE) 1-2 puffs every 4-6 hours as needed sob ADVAIR DISKUS 250-50 MCG/DOSE  MISC (FLUTICASONE-SALMETEROL) 1 puff two times a day SINGULAIR 10 MG  TABS (MONTELUKAST SODIUM) 1 by mouth at bedtime LAMICTAL 100 MG  TABS (LAMOTRIGINE) 2 and 1/2 tabs by mouth daily MULTIVITAMINS   TABS (MULTIPLE VITAMIN) 1 by mouth daily FLONASE 50 MCG/ACT  SUSP (FLUTICASONE PROPIONATE) Two puffs each nostril daily as needed VYVANSE 30 MG CAPS (LISDEXAMFETAMINE DIMESYLATE) take 1 by mouth once daily  Current Allergies (reviewed today): ! OXYCODONE HCL  Past Medical History:    Reviewed history from 06/11/2008 and no changes required:       ADD- Dr. Meredith Staggers       PTSD       PANIC DISORDER (ICD-300.01)       GERD (ICD-530.81)       Hyperlipidemia       Asthma              Current Meds:        PROTONIX 40 MG  TBEC (PANTOPRAZOLE SODIUM) 1 by mouth daily       PROAIR HFA 108 (90 BASE) MCG/ACT  AERS (ALBUTEROL  SULFATE) 1-2 puffs every 4-6 hours as needed sob       ADVAIR DISKUS 250-50 MCG/DOSE  MISC (FLUTICASONE-SALMETEROL) 1 puff two times a day       PROZAC 10 MG  TABS (FLUOXETINE HCL) 3 tabs by mouth daily       SINGULAIR 10 MG  TABS (MONTELUKAST SODIUM) 1 by mouth at bedtime       LAMICTAL 100 MG  TABS (LAMOTRIGINE) 2 and 1/2 tabs by mouth daily       ZANTAC 150 MG  TABS (RANITIDINE HCL) 1 by mouth at bedtime       FLAXSEED OIL 1200 MG  CAPS (FLAXSEED (LINSEED)) 1 by mouth at bedtime       MULTIVITAMINS   TABS (MULTIPLE VITAMIN) 1 by mouth daily       MAGNESIUM OXIDE 400 MG  CAPS (MAGNESIUM OXIDE) 1 by mouth at bedtime       FISH OIL 1200 MG  CAPS (OMEGA-3 FATTY ACIDS) 1 by mouth daily       GNP CINNAMON 500 MG  CAPS (CINNAMON) 1 by mouth two times a day       CLONAZEPAM 0.5 MG  TABS (CLONAZEPAM) 1/2-1 two times a day as needed       HYDROXYZINE HCL 10  MG  TABS (HYDROXYZINE HCL) 1-2 three times a day as needed       FLONASE 50 MCG/ACT  SUSP (FLUTICASONE PROPIONATE) Two puffs each nostril daily as needed              Allergies: OXYCODONE HCL.     Family History:    Family History COPD father and mother    Family History Asthma  two sisters    Family History Sleep Apnea- sister    Review of Systems      See HPI   Vital Signs:  Patient Profile:   50 Years Old Female Weight:      195.13 pounds O2 Sat:      98 % O2 treatment:    Room Air Pulse rate:   85 / minute BP sitting:   112 / 66  (left arm) Cuff size:   regular  Vitals Entered By: Reynaldo Minium CMA (August 28, 2008 10:00 AM)             Comments Medications reviewed with patient Reynaldo Minium CMA  August 28, 2008 10:00 AM      Physical Exam  General: A/Ox3; pleasant and cooperative, NAD, SKIN: no rash, lesions NODES: no lymphadenopathy HEENT: [place]/AT, EOM- WNL, Conjuctivae- clear, PERRLA, periorbital edema, TM-WNL, Nose- clear, Throat-patchy red, [place] NECK: Supple w/ fair ROM, JVD- none, normal  carotid impulses w/o bruits Thyroid- normal to palpation CHEST: Clear to P&A HEART: RRR, no m/g/r heard ABDOMEN: Soft and nl; nml bowel sounds; no organomegaly or masses noted ZOX:WRUE, nl pulses, no edema  NEURO: Grossly intact to observation         Impression & Recommendations:  Problem # 1:  ? of SLEEP APNEA (ICD-780.57) Schedule NPSG. Sleep hygiene.  Problem # 2:  ALLERGIC RHINITIS (ICD-477.9) Continue present treatment. Allergy vaccine option as needed. Her updated medication list for this problem includes:    Flonase 50 Mcg/act Susp (Fluticasone propionate) .Marland Kitchen..Marland Kitchen Two puffs each nostril daily as needed   Medications Added to Medication List This Visit: 1)  Vyvanse 30 Mg Caps (Lisdexamfetamine dimesylate) .... Take 1 by mouth once daily   Patient Instructions: 1)  Please schedule a follow-up appointment in 1 month. 2)  Continue current allergy treatments 3)  See PCC to schedule sleep study   ]  Appended Document: SNORING CONSIDERING SLEEP STUDY        Current Allergies: ! OXYCODONE HCL   Family History:    Family History COPD father and mother    Family History Asthma  two sisters    Family History Sleep Apnea- sister    mother alive age 18--arthritis, anxiety, chronic depression    father alive age 29--cardiomegaly,etoh abuse    1 sibling alive age 30--HBP, morbid obestiy    1 sibling alive age 91--seasonal allergies, depression    1 sibling alive age 18--HBP, hyperchol    1 sibling alive age 27--seasonal allergies, food allergies, anxiety,arthritis  Social History:    Patient states former smoker.   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

## 2011-01-29 NOTE — Progress Notes (Signed)
Summary: f/u w/ cy asap- insurance changes  Phone Note Call from Patient   Caller: Patient Call For: young Summary of Call: pt requests to see dr young for a f/u BEFORE her insurance changes. her available dates that she could see dr young are as follows: tommorow 7/16, 7/20 or 7/21. please call pt at work and advise [phone] x [phone] Initial call taken by: Tivis Ringer,  July 11, 2009 12:10 PM  Follow-up for Phone Call        Dr. Maple Hudson  can you advise where we can add this pt on--if we even can per any of the dates that pt requested due to her insurance changing.  please advise triage.  thanks Marijo File CMA  July 11, 2009 12:15 PM   Additional Follow-up for Phone Call Additional follow up Details #1::        per CY pt placed 7/20 at 9:00am. LMTCB to advise pt of appt. Carron Curie CMA  July 12, 2009 10:05 AM     Additional Follow-up for Phone Call Additional follow up Details #2::    [facility] informing pt of appt date and time.  Aundra Millet Reynolds LPN  July 12, 2009 3:28 PM
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
## 2011-01-29 NOTE — Letter (Signed)
Summary: EKG Strips  EKG Strips   Imported By: Marylou Mccoy 12/24/2010 16:19:36  _____________________________________________________________________  External Attachment:    Type:   Image     Comment:   External Document

## 2011-01-29 NOTE — Progress Notes (Signed)
  Phone Note Call from Patient   Caller: Patient Summary of Call: Norma Lopez had bronchoscopy on 04/29.  She has developed upper airway "wheeze".  She says that when she has a forced exhalation she gets this.  She also has this when she lays on her right side with her head elevated.  She does not feel short of breath otherwise.  She was having a dry cough last night, but this has improved.  She just started her prednisone this morning.  She does have seasonal allergies.  She has not been taking claritin recently.  She does also have a history of reflux.  She also has pain in her right upper chest when she takes a deep breath.  I have advised her to try using claritin for the next few days in case she has problems with post-nasal drip and upper airway irritation.  I have also asked her to use her protonix two times a day for the next few days in case she is having worsening reflux.  If she has irritation from the bronchoscopy, then I suspect the prednisone should help this.  I have advised her that if her chest pain worsens she may need to come in for an xray. Initial call taken by: Coralyn Helling MD,  Apr 28, 2008 2:03 PM

## 2011-01-29 NOTE — Procedures (Signed)
Summary: Bro Procedure/[facility]  Bro Procedure/[facility]   Imported By: Esmeralda Links D'jimraou 05/02/2008 14:54:56  _____________________________________________________________________  External Attachment:    Type:   Image     Comment:   External Document

## 2011-01-29 NOTE — Progress Notes (Signed)
Summary: req nurse call/ bronch side effects  Phone Note Call from Patient   Caller: Patient Call For: wright Summary of Call: pt had a bronch yesterday. is wheezing, having "ocassional" pain and difficulty staying awake. req nurse call.  Initial call taken by: Tivis Ringer,  April 26, 2008 9:30 AM  Follow-up for Phone Call        ov scheduled with tammy parrett , NP today at 3:45 Follow-up by: Abigail Miyamoto RN,  April 26, 2008 9:55 AM

## 2011-01-29 NOTE — Progress Notes (Signed)
Summary: reschedule sleep study  Phone Note Call from Patient   Caller: Patient Call For: young Summary of Call: pt needs to re-schedule sleep study. cell [phone] Initial call taken by: Tivis Ringer,  September 17, 2008 9:31 AM    pt given # to sleep ctr to call and reschedule appt for npsg lmom
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
## 2011-01-29 NOTE — Assessment & Plan Note (Signed)
Summary: Pulmonary OV   PCP:  Dr. Joetta Manners  Chief Complaint:  Follow-up bronch.  History of Present Illness: Norma Lopez is a 50 year old, white female, seen today and return pulmonary follow-up.  She has evidence of granulomatous lung disease on chest x-ray and CT scan of the chest.  She also has long-standing history of asthma.  Previous workup has demonstrated obstructive airways disease on pulmonary function testing.  The patient underwent bronchoscopy.  End of April 2009.  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  We prescribed a course of prednisone for 16 days.  In the interim.  She states her breathing is improved.  She has less wheezing.  There is less chest tightness and cough.  She denies any hemoptysis, edema, or other constitutional complaints.  Patient returns for follow up testing.  All cultures from bronchoscopy are negative.  Serologic studies are unremarkable.  The ACE level is low.  The patient is now off systemic corticosteroids.  The patient notes she has never undergone allergy testing.  She wishes to pursue this.         Current Allergies: ! OXYCODONE HCL  Past Medical History:    Reviewed history from 04/20/2008 and no changes required:       Current Problems:        BIPOLAR DISORDER UNSPECIFIED (ICD-296.80)       PANIC DISORDER (ICD-300.01)       GERD (ICD-530.81)       Hyperlipidemia       Asthma              Current Meds:        PROTONIX 40 MG  TBEC (PANTOPRAZOLE SODIUM) 1 by mouth daily       PROAIR HFA 108 (90 BASE) MCG/ACT  AERS (ALBUTEROL SULFATE) 1-2 puffs every 4-6 hours as needed sob       ADVAIR DISKUS 250-50 MCG/DOSE  MISC (FLUTICASONE-SALMETEROL) 1 puff two times a day       PROZAC 10 MG  TABS (FLUOXETINE HCL) 3 tabs by mouth daily       SINGULAIR 10 MG   TABS (MONTELUKAST SODIUM) 1 by mouth at bedtime       LAMICTAL 100 MG  TABS (LAMOTRIGINE) 2 and 1/2 tabs by mouth daily       ZANTAC 150 MG  TABS (RANITIDINE HCL) 1 by mouth at bedtime       FLAXSEED OIL 1200 MG  CAPS (FLAXSEED (LINSEED)) 1 by mouth at bedtime       MULTIVITAMINS   TABS (MULTIPLE VITAMIN) 1 by mouth daily       MAGNESIUM OXIDE 400 MG  CAPS (MAGNESIUM OXIDE) 1 by mouth at bedtime       FISH OIL 1200 MG  CAPS (OMEGA-3 FATTY ACIDS) 1 by mouth daily       GNP CINNAMON 500 MG  CAPS (CINNAMON) 1 by mouth two times a day       CLONAZEPAM 0.5 MG  TABS (CLONAZEPAM) 1/2-1 two times a day as needed       HYDROXYZINE HCL 10 MG  TABS (HYDROXYZINE HCL) 1-2 three times a day as needed       FLONASE 50 MCG/ACT  SUSP (FLUTICASONE PROPIONATE) Two puffs each nostril daily as needed  Allergies: OXYCODONE HCL.     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 Michel Bickers CMA  May 16, 2008 4:04 PM     Physical Exam  Gen: WD WN    WF      in NAD    NCAT Heent:  no jvd, no TMG, no cervical LNademopathy, orophyx clear,  nares with clear watery drainage. 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 Plan continued observation and avoidance of further environmental exposures that triggered the original pulmonary insult Orders: Est. Patient Level IV (16109)   Problem # 2:  ASTHMA (ICD-493.90) Severe persistent asthma with significant atopic features. Plan maintain Advair at current prescribed level. Refer to allergy for skin testing and allergy evaluation Her updated medication list for this problem includes:    Proair Hfa 108 (90 Base) Mcg/act Aers (Albuterol sulfate) .Marland Kitchen... 1-2 puffs every 4-6 hours as needed sob    Advair Diskus 250-50 Mcg/dose Misc (Fluticasone-salmeterol) .Marland Kitchen... 1 puff two times a day    Singulair 10 Mg Tabs (Montelukast sodium) .Marland Kitchen... 1 by mouth at bedtime  Orders: Est. Patient Level IV (60454) Allergy Referral  (Allergy)   Medications Added to Medication List This Visit: 1)  Zantac 150 Mg Tabs (Ranitidine hcl) .Marland Kitchen.. 1 by mouth at bedtime 2)  Flaxseed Oil 1200 Mg Caps (Flaxseed (linseed)) .Marland Kitchen.. 1 by mouth daily 3)  Wellbutrin Xl 300 Mg Tb24 (Bupropion hcl) .Marland Kitchen.. 1 by mouth daily  Other Orders: Allergy Referral  (Allergy)   Patient Instructions: 1)  an allergy evaluation will be made 2)  No change in medications 3)  Please schedule a follow-up appointment in 3 months.   ]

## 2011-02-02 ENCOUNTER — Institutional Professional Consult (permissible substitution) (INDEPENDENT_AMBULATORY_CARE_PROVIDER_SITE_OTHER): Payer: Self-pay | Admitting: Internal Medicine

## 2011-02-02 ENCOUNTER — Encounter: Payer: Self-pay | Admitting: Internal Medicine

## 2011-02-02 DIAGNOSIS — I4949 Other premature depolarization: Secondary | ICD-10-CM

## 2011-02-02 DIAGNOSIS — R42 Dizziness and giddiness: Secondary | ICD-10-CM | POA: Insufficient documentation

## 2011-02-02 DIAGNOSIS — I493 Ventricular premature depolarization: Secondary | ICD-10-CM | POA: Insufficient documentation

## 2011-02-02 HISTORY — DX: Other premature depolarization: I49.49

## 2011-02-02 HISTORY — DX: Ventricular premature depolarization: I49.3

## 2011-02-10 ENCOUNTER — Encounter (INDEPENDENT_AMBULATORY_CARE_PROVIDER_SITE_OTHER): Payer: BC Managed Care – PPO | Admitting: Physician Assistant

## 2011-02-10 ENCOUNTER — Encounter (INDEPENDENT_AMBULATORY_CARE_PROVIDER_SITE_OTHER): Payer: BC Managed Care – PPO

## 2011-02-10 ENCOUNTER — Encounter: Payer: Self-pay | Admitting: Physician Assistant

## 2011-02-10 DIAGNOSIS — R0989 Other specified symptoms and signs involving the circulatory and respiratory systems: Secondary | ICD-10-CM

## 2011-02-10 DIAGNOSIS — R42 Dizziness and giddiness: Secondary | ICD-10-CM

## 2011-02-11 ENCOUNTER — Encounter: Payer: Self-pay | Admitting: Internal Medicine

## 2011-02-12 NOTE — Assessment & Plan Note (Signed)
Summary: ***NEP RS FROM 12/26/10 PER PT CALL-MB R.S FROM BUMPLIST. GD*.Marland KitchenMarland Kitchen   Visit Type:  Initial Consult Referring Provider:  Jonni Sanger Primary Provider:  Dr. Joetta Manners  CC:  palpatations .  History of Present Illness: Norma Lopez is referred today by Dr. Woodfin Ganja for evaluation of palpitations. The patient's history dates back over a year ago.  She has had palpitations since then and has noted periodes of fatigue, dizziness, and weakness for several months.  The patient works at [employer] and has had documented PVC's in a bigeminal fashion.  She has chest adenopathy and this raised the question of Sarcoidosis but her 2 D echo demonstated preserved LV function.  She has never had syncope.  She does have problems with sleep apnea and is on CPAP.  No other complaints. She has been treated with multiple beta blockers which have not been helpful.  Current Medications (verified): 1)  Protonix 40 Mg  Tbec (Pantoprazole Sodium) .Marland Kitchen.. 1 Tab Two Times A Day 2)  Proair Hfa 108 (90 Base) Mcg/act  Aers (Albuterol Sulfate) .Marland Kitchen.. 1-2 Puffs Every 4-6 Hours As Needed Sob 3)  Multivitamins   Tabs (Multiple Vitamin) .Marland Kitchen.. 1 By Mouth Daily 4)  Flonase 50 Mcg/act  Susp (Fluticasone Propionate) .... Two Puffs Each Nostril Daily As Needed 5)  Cpap 11 Hme 6)  Vitamin D 1000 Unit Tabs (Cholecalciferol) .... Take 2 Tablet By Mouth Once A Day 7)  Patanase 0.6 % Soln (Olopatadine Hcl) .... 2 Sprays Two Times A Day 8)  Qvar 80 Mcg/act Aers (Beclomethasone Dipropionate) .... 2 Puffs Two Times A Day 9)  Mucinex 600 Mg Xr12h-Tab (Guaifenesin) .... Take Prn 10)  Vicodin 5-500 Mg Tabs (Hydrocodone-Acetaminophen) .Marland Kitchen.. 1 To 2 Tabs As Needed For Pain 11)  Xopenex 1.25 Mg/7ml Nebu (Levalbuterol Hcl) .... As Needed 12)  Flexeril 10 Mg Tabs (Cyclobenzaprine Hcl) .... Take Prn 13)  Claritin 10 Mg Tabs (Loratadine) .... Take 1 Tab Daily 14)  Flecainide Acetate 100 Mg Tabs (Flecainide Acetate) .... Take 1/2 Tablet By Mouth  Two Times A Day X 1 Week Then Increase To A Whloe Tablet Two Times A Day  Allergies (verified): 1)  ! Oxycodone Hcl  Comments:  Nurse/Medical Assistant: patient brought med list she and i reviewed [facility]s [facility] [place]  Past History:  Past Medical History: Last updated: 07/16/2009 ADD- Dr. Meredith Staggers PTSD PANIC DISORDER (ICD-300.01) GERD (ICD-530.81) Hyperlipidemia Asthma Sleep Apnea- NPSG 7/hr 10/15/08 Allergic Rhinitis-- skin test  Past Surgical History: Last updated: 04/20/2008 breast bx benign 1985/2006 Total Abdominal Hysterectomy 1992 Knee Arthroscopy times 3 Tonsillectomy Cholecystectomy  Family History: Last updated: 09/18/2008 Family History COPD father and mother Family History Asthma  two sisters Family History Sleep Apnea- sister mother alive age 38--arthritis, anxiety, chronic depression father alive age 67--cardiomegaly,etoh abuse 1 sibling alive age 66--HBP, morbid obestiy 1 sibling alive age 39--seasonal allergies, depression 1 sibling alive age 60--HBP, hyperchol 1 sibling alive age 5--seasonal allergies, food allergies, anxiety,arthritis  Social History: Last updated: 09/18/2008 Patient states former smoker.   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  Vital Signs:  Patient profile:   50 year old female Weight:      227 pounds BMI:     37.91  O2 Sat:      97 % on Room air Pulse rate:   91 / minute BP sitting:   140 / 88  (left arm)  Vitals Entered By: Dreama Saa, CNA (February 02, 2011 10:08 AM)  O2 Flow:  Room air  Physical Exam  General:  Obese, well developed, well nourished, in no acute distress.  HEENT: normal Neck: supple. No JVD. Carotids 2+ bilaterally no bruits Cor: RRR no rubs, gallops or murmur Lungs: CTA Ab:  soft, nontender. nondistended. No HSM. Good bowel sounds. Obese. Ext: warm. no cyanosis, clubbing or edema Neuro: alert and oriented. Grossly nonfocal. affect pleasant    EKG  Procedure date:  02/02/2011  Findings:      Normal sinus rhythm with rate of: 89. PVC's noted.    Impression & Recommendations:  Problem # 1:  OTHER PREMATURE BEATS (ICD-427.69) The patient has symptomatic PVC's and I have had a chance to review her ECG's.  Her PVC's appear to be originating in the LV or RV inflow tract perhaps near the crux of the heart. I do not think they are due to an infiltrative cardiomyopathy but I cannot definitively exclude this.  As she has failed beta blocker therapy, I have recommended that she try flecainide though we also discussed the possiblity of first trying a calcium bloocker. I also discussed catheter ablation but would not recommend this at this time unless she fails flecainide. Her updated medication list for this problem includes:    Flecainide Acetate 100 Mg Tabs (Flecainide acetate) .Marland Kitchen... Take 1/2 tablet by mouth two times a day x 1 week then increase to a whloe tablet two times a day  Orders: Treadmill (Treadmill)  Problem # 2:  DIZZINESS (ICD-780.4) This is related to her ectopy. I will plan a GXT. Orders: Treadmill (Treadmill)  Patient Instructions: 1)  Your physician recommends that you schedule a follow-up appointment in: TBA 2)  Your physician has recommended you make the following change in your medication: Start taking Flecanide 50mg  (1/2 tablet) by mouth two times a day for 1 week then increase to 100mg  (whole tablet) by mouth two times a day  3)  Your physician has requested that you have an exercise tolerance test.  For further information please visit [email or web address]  Please also follow instruction sheet, as given. Prescriptions: FLECAINIDE ACETATE 100 MG TABS (FLECAINIDE ACETATE) take 1/2 tablet by mouth two times a day X 1 week then increase to a  whloe tablet two times a day  #60 x 3   Entered by:   Larita Fife Via LPN   Authorized by:   Laren Boom, MD, [facility]   Signed by:   Larita Fife Via LPN on 54/08/8118   Method used:   Electronically to        [facility]  [address] [phone]* (retail)       [address]       [place], [place]  [address]       Ph: [phone] or [phone]       Fax: [phone]   RxID:   [id number]

## 2011-02-18 NOTE — Letter (Signed)
Summary: [place] [employer], agricultural at [facility] [employer]  [address], [place] [address]   Phone: [phone]  Fax: [phone]      February 11, 2011 MRN: [id number]   [facility] [address] [place], [place]  [address]   Dear Ms. Llanas,  Your test ordered by Selena Batten has been reviewed by your physician (or physician assistant) and was found to be normal or stable. Your physician (or physician assistant) felt no changes were needed at this time.  ____ Echocardiogram  __X__ Cardiac Stress Test- continue to take Flecainide and follow up with Dr. Ladona Ridgel as directed.  ____ Lab Work  ____ Peripheral vascular study of arms, legs or neck  ____ CT scan or X-ray  ____ Lung or Breathing test  ____ Other:  Please continue on current medical treatment.  Thank you.   Lewayne Bunting, MD, F.A.C.C

## 2011-03-02 ENCOUNTER — Telehealth (INDEPENDENT_AMBULATORY_CARE_PROVIDER_SITE_OTHER): Payer: Self-pay | Admitting: *Deleted

## 2011-03-05 NOTE — Letter (Signed)
Summary: [facility] Office Visit Note   [facility] Office Visit Note   Imported By: Roderic Ovens 02/24/2011 11:53:50  _____________________________________________________________________  External Attachment:    Type:   Image     Comment:   External Document

## 2011-03-05 NOTE — Procedures (Signed)
Summary: [facility]   [facility]   Imported By: Roderic Ovens 02/24/2011 11:48:47  _____________________________________________________________________  External Attachment:    Type:   Image     Comment:   External Document

## 2011-03-05 NOTE — Letter (Signed)
Summary: [facility]   Imported By: Roderic Ovens 02/24/2011 12:00:21  _____________________________________________________________________  External Attachment:    Type:   Image     Comment:   External Document

## 2011-03-10 NOTE — Progress Notes (Signed)
Summary: Norma Lopez PT  Phone Note Call from Patient Call back at [phone]   Caller: pt Reason for Call: Talk to Nurse Summary of Call: pt need to know when to follow up next with dr taylor. last office visit note states TBA and stress test letter doesnt say when either. Initial call taken by: Faythe Ghee,  March 02, 2011 10:57 AM  Follow-up for Phone Call        Dr Ladona Ridgel in [place].  Please ask him Dennis Bast, RN, BSN  March 02, 2011 12:24 PM per Dr Ladona Ridgel needs a follow up in two months Dennis Bast, RN, BSN  March 05, 2011 8:54 AM

## 2011-05-12 NOTE — Procedures (Signed)
Norma Lopez, GRANIER NO.:  [id number]   MEDICAL RECORD NO.:  [id number]          PATIENT TYPE:  OUT   LOCATION:  SLEEP CENTER                 FACILITY:  [facility]   PHYSICIAN:  Clinton D. Maple Hudson, MD, FCCP, FACPDATE OF BIRTH:  04/19/62   DATE OF STUDY:  10/15/2008                            NOCTURNAL POLYSOMNOGRAM   REFERRING PHYSICIAN:   REFERRING PHYSICIAN:  Clinton D. Maple Hudson, MD, FCCP, FACP.   INDICATION FOR STUDY:  Insomnia with sleep apnea.   EPWORTH SLEEPINESS SCORE:  Epworth sleepiness score 12/24.  BMI 32.8.  Weight 197 pounds. Height 65 inches.  Neck 15 inches.   MEDICATIONS:  Home medications are charted and reviewed.   SLEEP ARCHITECTURE:  Total sleep time 358 minutes with sleep efficiency  87.4%.  Stage I was 7%.  Stage II is 60.9%.  Stage III 10.5%.  REM 21.6%  of total sleep time.  Sleep latency is 10 minutes.  REM latency 100.5  minutes.  Awake after sleep onset 41 minutes.  Arousal index 19.7.   BEDTIME MEDICATIONS:  Not listed.   RESPIRATORY DATA:  Apnea-hypopnea index (AHI) is 7 per hour.  Respiratory disturbance index (RDI) 11.4 per hour.  A total of 42 events  were counted including 6 obstructive apneas, 6 central apneas, and 30  hypopneas.  Events were all scored with the non-supine position.  REM  AHI 2.3.  RERA count (respiratory events related to arousal) total 26,  index 4.4 per hour.  There were insufficient events to permit CPAP  titration by split protocol on the study night.   OXYGEN DATA:  Mild snoring with oxygen desaturation to a nadir of 86%.  Mean oxygen saturation through the study was 91.1% on room air and 0.7  minutes spent with saturation less than 88%.   CARDIAC DATA:  Normal sinus rhythm.   MOVEMENT/PARASOMNIA:  No significant movement disturbance.  Bathroom x1.   IMPRESSIONS/RECOMMENDATIONS:  1. Sleep architecture was not remarkable for sleep center environment.      Note, that listed medication includes Concerta.   If complaint      include difficulty sustaining sleep then discuss use of this      medication and potential prolonged stimulant effect.  2. 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  Mild      snoring with oxygen desaturation to a nadir of 86%.      Clinton D. Maple Hudson, MD, [facility], FACP  Diplomate, Biomedical engineer of Sleep Medicine  Electronically Signed     CDY/MEDQ  D:  10/20/2008 10:45:39  T:  10/20/2008 16:10:96  Job:  [id number]

## 2011-05-12 NOTE — Op Note (Signed)
Norma Lopez, WASKEY NO.:  [id number]   MEDICAL RECORD NO.:  [id number]          PATIENT TYPE:  AMB   LOCATION:  CARD                         FACILITY:  [facility]   PHYSICIAN:  Charlcie Cradle. Delford Field, MD, FCCPDATE OF BIRTH:  1961/01/22   DATE OF PROCEDURE:  04/25/2008  DATE OF DISCHARGE:                               OPERATIVE REPORT   PROCEDURE:  Bronchoscopy.   ATTENDING:  Charlcie Cradle. Delford Field, MD   CHIEF COMPLAINT:  Evaluate for pneumoconiosis versus sarcoidosis versus  other environmental exposure and resultant airway injury.   OPERATOR:  Shan Levans, MD.   ANESTHESIA:  1% Xylocaine local.   PREOPERATIVE MEDICATION:  Fentanyl 75 mcg, Versed 4 mg IV push.   PROCEDURE:  The Pentax video bronchoscope was introduced via the left  naris.  The upper airways were visualized and were unremarkable.  The  entire tracheobronchial tree was visualized, revealed no endobronchial  lesions and essentially had normal anatomy.  Attention was then paid to  the right upper lobe, anterior segment.  Bronchial washings were  obtained.  Bronchioalveolar lavage was also obtained, right middle lobe,  medial segment, approximately 75 mL infused, 25-30 mL returned.  Transbronchial biopsies x6 were obtained from the right upper lobe  anterior segment and right middle lobe.   COMPLICATIONS:  None.   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   RECOMMENDATIONS:  Follow up microbiologic and pathologic data.      Charlcie Cradle Delford Field, MD, [facility]  Electronically Signed     PEW/MEDQ  D:  04/25/2008  T:  04/25/2008  Job:  [id number]   cc:   Raynelle Jan, M.D.  Fax: [phone]

## 2011-09-22 LAB — CULTURE, RESPIRATORY W GRAM STAIN: Gram Stain: NONE SEEN

## 2011-09-22 LAB — FUNGUS CULTURE W SMEAR: Fungal Smear: NONE SEEN

## 2011-09-22 LAB — AFB CULTURE WITH SMEAR (NOT AT ARMC): Acid Fast Smear: NONE SEEN

## 2011-09-22 LAB — CULTURE, RESPIRATORY

## 2011-12-23 ENCOUNTER — Encounter: Payer: Self-pay | Admitting: Cardiology

## 2012-08-25 DIAGNOSIS — R32 Unspecified urinary incontinence: Secondary | ICD-10-CM | POA: Insufficient documentation

## 2013-12-28 DIAGNOSIS — I499 Cardiac arrhythmia, unspecified: Secondary | ICD-10-CM

## 2013-12-28 HISTORY — DX: Cardiac arrhythmia, unspecified: I49.9

## 2014-12-25 ENCOUNTER — Other Ambulatory Visit: Payer: Self-pay | Admitting: Family Medicine

## 2014-12-25 DIAGNOSIS — I8393 Asymptomatic varicose veins of bilateral lower extremities: Secondary | ICD-10-CM

## 2015-10-11 ENCOUNTER — Ambulatory Visit: Payer: PRIVATE HEALTH INSURANCE | Attending: Gynecology | Admitting: Gynecology

## 2015-10-11 ENCOUNTER — Encounter: Payer: Self-pay | Admitting: Gynecology

## 2015-10-11 DIAGNOSIS — R971 Elevated cancer antigen 125 [CA 125]: Secondary | ICD-10-CM | POA: Diagnosis not present

## 2015-10-11 DIAGNOSIS — N83299 Other ovarian cyst, unspecified side: Secondary | ICD-10-CM

## 2015-10-11 DIAGNOSIS — E669 Obesity, unspecified: Secondary | ICD-10-CM | POA: Diagnosis not present

## 2015-10-11 NOTE — Patient Instructions (Signed)
Plan for surgery with Dr. Fermin Schwab on Tuesday, October 18.  Your pre-operative appointment at [facility] will be on Monday, Oct 17 at 9am.

## 2015-10-11 NOTE — Progress Notes (Signed)
Consult Note: Gyn-Onc   Norma Lopez 54 y.o. female  Chief Complaint  Patient presents with  . complex ovarian cyst    New Consult    Assessment : Complex adnexal mass and elevated CA-125 suspicious for ovarian cancer.  Plan: I lengthy discussion with the patient and her sister regarding the differential diagnosis. Made a clear that we are very suspicious she has ovarian cancer and recommend she undergo exploratory laparotomy resection of the mass and intraoperative frozen section. The extensive surgery including omentectomy and lymphadenectomy was discussed. Further, the risks of surgery including hemorrhage, infection, injury to adjacent viscera, trauma [place] competitions and anesthetic risks were discussed. The patient saw her pulmonologist yesterday who is indicated that she is okay for surgery. The patient will bring her CPAP machine with her. She is aware of increased risk of wound complications given her obesity. All questions are answered. In order to expedite surgery the patient will be operated on at [facility] on October 18. She will come on October 17 for preoperative evaluation.    HPI: 54 year old white female gravida 3 seen in consultation request of Dr. Dellis Filbert regarding management of a newly diagnosed complex pelvic mass and associated elevated CA-125. The patient has had 2 months of abdominal pain especially on the right side. Initially she attributed it to diverticulitis. However evaluation included a CT scan which showed a 10.6 x 8.1 x 6.7 cm right adnexal mass is complex in nature. Left ovary is unremarkable. (Patient previously has had a hysterectomy for uterine fibroids) there is a small amount of intraperitoneal ascites. CA-125 value is 104.9 units per mL. CEA 2.1.  The patient has sarcoidosis and has been on prednisone between May and until recently when she went on a prednisone taper because of weight gain. She uses aCPAP machine at night. Patient is a Marine scientist working in  radiology.  The patient has no family history of ovarian or breast cancer..  Review of Systems:10 point review of systems is negative except as noted in interval history.   Vitals: Blood pressure 127/71, pulse 84, temperature 98.7 F (37.1 C), temperature source Oral, resp. rate 18, height 5' 5" (1.651 m), weight 244 lb 8 oz (110.904 kg), SpO2 99 %.  Physical Exam: General : The patient is a healthy woman in no acute distress.  HEENT: normocephalic, extraoccular movements normal; neck is supple without thyromegally  Lynphnodes: Supraclavicular and inguinal nodes not enlarged  Abdomen: Obese, Soft, non-tender, no ascites, no organomegally, no masses, no hernias  Pelvic:  EGBUS: Normal female  Vagina: Normal, no lesions  Urethra and Bladder: Normal, non-tender  Cervix: Surgically absent  Uterus: Surgically absent  Bi-manual examination: Non-tender; no adenxal masses or nodularity (I'm unable to feel the mass) Rectal: normal sphincter tone, no masses, no blood  Lower extremities: No edema or varicosities. Normal range of motion      Allergies  Allergen Reactions  . Latex Itching  . Oxycodone Hcl     REACTION: itching    Past Medical History  Diagnosis Date  . ADD (attention deficit disorder with hyperactivity)   . PTSD (post-traumatic stress disorder)   . Panic disorder   . GERD (gastroesophageal reflux disease)   . Hyperlipidemia   . Asthma   . Sleep apnea   . Allergic rhinitis   . Allergy   . Sleep apnea     patient reports using CPAP machine at night    Past Surgical History  Procedure Laterality Date  . Breast biopsy  1985, 2006    Benign  . Total abdominal hysterectomy  1992  . Knee arthroscopy      x3  . Tonsillectomy    . Cholecystectomy      Current Outpatient Prescriptions  Medication Sig Dispense Refill  . albuterol (PROAIR HFA) 108 (90 BASE) MCG/ACT inhaler Inhale into the lungs.    Marland Kitchen albuterol (PROVENTIL HFA;VENTOLIN HFA) 108 (90 BASE) MCG/ACT  inhaler Inhale 2 puffs into the lungs every 6 (six) hours as needed.      . Blood Glucose Monitoring Suppl (ONE TOUCH ULTRA 2) W/DEVICE KIT USE AS DIRECTED    . budesonide-formoterol (SYMBICORT) 160-4.5 MCG/ACT inhaler Inhale into the lungs.    Marland Kitchen buPROPion (WELLBUTRIN XL) 150 MG 24 hr tablet Take 150 mg by mouth daily.    . Cholecalciferol (VITAMIN D) 1000 UNITS capsule Take 2,000 Units by mouth daily.      . Cholecalciferol (VITAMIN D3) 2000 UNITS capsule Take by mouth.    . desvenlafaxine (PRISTIQ) 50 MG 24 hr tablet Take by mouth.    . dexlansoprazole (DEXILANT) 60 MG capsule Take by mouth.    . estradiol (CLIMARA - DOSED IN MG/24 HR) 0.05 mg/24hr patch Place onto the skin.    Marland Kitchen ezetimibe (ZETIA) 10 MG tablet Take by mouth.    Marland Kitchen glucose blood (ONE TOUCH ULTRA TEST) test strip TEST 2-3 TIMES PER WEEK    . guaiFENesin (MUCINEX) 600 MG 12 hr tablet Take 1,200 mg by mouth as needed.      Marland Kitchen guaiFENesin (MUCINEX) 600 MG 12 hr tablet Take by mouth.    Marland Kitchen ibuprofen (ADVIL,MOTRIN) 200 MG tablet Take by mouth.    . loratadine (CLARITIN) 10 MG tablet Take 10 mg by mouth daily.      Marland Kitchen loratadine (CLARITIN) 10 MG tablet Take by mouth.    . Magnesium Oxide 500 MG TABS Take by mouth.    . montelukast (SINGULAIR) 10 MG tablet Take by mouth.    . Multiple Vitamin (MULTI-VITAMINS) TABS Take by mouth.    . Multiple Vitamin (MULTIVITAMIN) tablet Take 1 tablet by mouth daily.      Glory Rosebush DELICA LANCETS FINE MISC TEST 2-3 TIMES PER WEEK    . ranitidine (ZANTAC) 300 MG capsule Take by mouth.    . traMADol (ULTRAM) 50 MG tablet TAKE 1 TO 2 TABLETS BY MOUTH EVERY 4 TO 6 HOURS AS NEEDED FOR PAIN (MAX 400MG/DAY)  0  . triamterene-hydrochlorothiazide (MAXZIDE-25) 37.5-25 MG tablet Take by mouth.     No current facility-administered medications for this visit.    Social History   Social History  . Marital Status: Married    Spouse Name: N/A  . Number of Children: N/A  . 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 Smoking status: Former Smoker -- 6 years    Quit date: 12/28/1996  . Smokeless tobacco: Not on file     Comment: Exposed to second hand smoke  . Alcohol Use: No  . Drug Use: No  . Sexual Activity: Yes   Other Topics Concern  . Not on file   Social History Narrative   Married with 3 children   3-4 cups caffeine per day   Exercises 2-3 times per week    Family History  Problem Relation Age of Onset  . COPD Mother   . Arthritis Mother   . Depression Mother   .  Anxiety disorder Mother   . COPD Father   . Alcohol abuse Father   . Heart disease Father   . Diabetes Father   . Asthma Sister   . Cancer Sister   . Asthma Sister   . Diabetes Sister   . Cancer Sister   . Sleep apnea Sister   . Hyperlipidemia Other   . Allergies Other   . Obesity Other   . Anxiety disorder Other   . Arthritis Other       CLARKE-PEARSON,DANIEL L, MD 10/11/2015, 3:38 PM

## 2015-10-15 DIAGNOSIS — N9489 Other specified conditions associated with female genital organs and menstrual cycle: Secondary | ICD-10-CM | POA: Insufficient documentation

## 2015-10-15 DIAGNOSIS — M5126 Other intervertebral disc displacement, lumbar region: Secondary | ICD-10-CM | POA: Insufficient documentation

## 2015-10-15 DIAGNOSIS — J4520 Mild intermittent asthma, uncomplicated: Secondary | ICD-10-CM | POA: Insufficient documentation

## 2015-10-15 DIAGNOSIS — I10 Essential (primary) hypertension: Secondary | ICD-10-CM

## 2015-10-15 HISTORY — DX: Essential (primary) hypertension: I10

## 2015-10-15 HISTORY — DX: Mild intermittent asthma, uncomplicated: J45.20

## 2015-10-15 HISTORY — DX: Other specified conditions associated with female genital organs and menstrual cycle: N94.89

## 2015-10-18 ENCOUNTER — Ambulatory Visit: Payer: Self-pay | Admitting: Gynecologic Oncology

## 2015-10-24 ENCOUNTER — Ambulatory Visit: Payer: PRIVATE HEALTH INSURANCE | Attending: Gynecologic Oncology | Admitting: Gynecologic Oncology

## 2015-10-24 VITALS — BP 127/66 | HR 79 | Temp 98.3°F | Resp 18 | Ht 65.0 in | Wt 245.5 lb

## 2015-10-24 DIAGNOSIS — T814XXA Infection following a procedure, initial encounter: Secondary | ICD-10-CM | POA: Diagnosis not present

## 2015-10-24 DIAGNOSIS — Z4802 Encounter for removal of sutures: Secondary | ICD-10-CM

## 2015-10-24 DIAGNOSIS — IMO0001 Reserved for inherently not codable concepts without codable children: Secondary | ICD-10-CM

## 2015-10-24 MED ORDER — CEPHALEXIN 500 MG PO CAPS: 500.0000 mg | ORAL_CAPSULE | Freq: Three times a day (TID) | ORAL | Status: DC

## 2015-10-24 NOTE — Patient Instructions (Addendum)
Doing well post-operatively.  Plan to begin Keflex 500 mg three times daily for seven days to treat the incisional cellulitis.  Please call if the left calf pain and right sided lateral back pain persists.  Plan to follow up as scheduled or sooner if needed.

## 2015-10-25 ENCOUNTER — Encounter: Payer: Self-pay | Admitting: Gynecologic Oncology

## 2015-10-25 NOTE — Progress Notes (Signed)
Follow Up Note: Gyn-Onc  Norma Lopez 54 y.o. female  CC:  Chief Complaint  Patient presents with  . Routine Post Op    Follow up, staple removal    HPI:  Angeliah Lopez is a 54 year old female initially referred by Dr. Dellis Filbert for management of a complex pelvic mass and elevated CA 125.  She reported 2 months of abdominal pain especially on the right side, which she related to diverticulitis.  During evaluation which included a CT scan, a 10.6 x 8.1 x 6.7 cm right adnexal mass complex in nature was noted and the left ovary is unremarkable.  Her history includes a hysterectomy for uterine fibroids, sarcoidosis, sleep apnea with CPAP use.  There was a small amount of intraperitoneal ascites. CA-125 value is 104.9 units per mL. CEA 2.1.  On 10/15/15, she underwent an exploratory laparotomy, bilateral salpingo-oophorectomy by Dr. Fermin Schwab at [facility].  Operative findings included:  100cc of serousanguinous ascites. Normal upper abdominal survey with normal liver edge and diaphragm. Filmy omental adhesions to anterior abdominal wall. Normal appearing small, large bowel, and appendix. Enlarged, 10cm, firm, mobile right ovarian mass. Uterus surgically absent. Tubes with peritubal cysts. Left ovary normal appearing. Bilateral ovaries with filmy adhesions to sidewalls and bowel epiploica. No evidence of peritoneal disease, ascites, or carcinomatosis. IOFS: fibrothecoma vs granulosa cell tumor.    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 Peritoneal fluid:  -No malignant cells identified  -Reactive mesothelial cells and blood  Interval History:  She  presents today alone for post-operative follow up and staple removal from surgery at [facility].  She reports doing well since surgery. Tolerating diet with no nausea or emesis.  Minimal pain reported.  Bowels and bladder functioning without difficulty.  Ambulating without difficulty but reporting left calf discomfort that has been present since discharge from the hospital.  Denies edema or erythema in the left leg.  She feels the discomfort is musculoskeletal.  She also reports right upper lateral abdomen/back discomfort with movement and deep inspiration, which she relates to having to sleep on her back at home.  Also reporting mild shortness of breath at times.  She has developed a cough with minimal "greenish" phlegm intermittently.  She states she normally sleeps on her side with her CPAP and she thinks the discomfort is musculoskeletal in nature.  She states she knew she had pleural effusions but "they should be resolving."  Reporting erythema around her incision, which she relates to wearing the abdominal binder.  She states she stopped wearing the binder and she feels there was some improvement in the redness.  Denies drainage from the incision.  Reporting intermittent low grade temperatures since surgery as well.  She states her temp has only reached 99.8 but she has had chills intermittently.  No other concerns voiced.      Review of Systems  Constitutional: Feels well.  Low grade temperature reported.  No early satiety, unintentional weight loss or gain.   Cardiovascular: Mild shortness of breath at times.  No edema.  Pulmonary: Cough with minimal sputum production.  No wheeze.  Gastrointestinal: No nausea, vomiting, or diarrhea. No bright red blood per rectum or change  in bowel movement.  Genitourinary: No frequency, urgency, or dysuria. No vaginal bleeding or discharge.  Musculoskeletal: Positive for right lateral side discomfort with movement and deep inspiration.  No joint pain. Neurologic: No  weakness, numbness, or change in gait.  Psychology: No depression, anxiety, or insomnia.  Current Meds:  Outpatient Encounter Prescriptions as of 10/24/2015  Medication Sig  . docusate sodium (COLACE) 100 MG capsule Take 100 mg by mouth.  . [EXPIRED] HYDROmorphone (DILAUDID) 4 MG tablet Take 4 mg by mouth.  . senna (SENOKOT) 8.6 MG tablet Take by mouth.  Marland Kitchen albuterol (PROVENTIL HFA;VENTOLIN HFA) 108 (90 BASE) MCG/ACT inhaler Inhale 2 puffs into the lungs every 6 (six) hours as needed.    . Blood Glucose Monitoring Suppl (ONE TOUCH ULTRA 2) W/DEVICE KIT USE AS DIRECTED  . budesonide-formoterol (SYMBICORT) 160-4.5 MCG/ACT inhaler Inhale into the lungs.  Marland Kitchen buPROPion (WELLBUTRIN XL) 150 MG 24 hr tablet Take 150 mg by mouth daily.  . cephALEXin (KEFLEX) 500 MG capsule Take 1 capsule (500 mg total) by mouth 3 (three) times daily.  . Cholecalciferol (VITAMIN D) 1000 UNITS capsule Take 2,000 Units by mouth daily.    . Cholecalciferol (VITAMIN D3) 2000 UNITS capsule Take by mouth.  . desvenlafaxine (PRISTIQ) 50 MG 24 hr tablet Take by mouth.  . dexlansoprazole (DEXILANT) 60 MG capsule Take by mouth.  . ezetimibe (ZETIA) 10 MG tablet Take by mouth.  Marland Kitchen glucose blood (ONE TOUCH ULTRA TEST) test strip TEST 2-3 TIMES PER WEEK  . guaiFENesin (MUCINEX) 600 MG 12 hr tablet Take 1,200 mg by mouth as needed.    Marland Kitchen ibuprofen (ADVIL,MOTRIN) 200 MG tablet Take by mouth.  . loratadine (CLARITIN) 10 MG tablet Take 10 mg by mouth daily.    . Magnesium Oxide 500 MG TABS Take by mouth.  . montelukast (SINGULAIR) 10 MG tablet Take by mouth.  Glory Rosebush DELICA LANCETS FINE MISC TEST 2-3 TIMES PER WEEK  . ranitidine (ZANTAC) 300 MG capsule Take by mouth.  . triamterene-hydrochlorothiazide (MAXZIDE-25) 37.5-25 MG tablet Take by mouth.  . [DISCONTINUED] albuterol (PROAIR HFA) 108 (90 BASE) MCG/ACT inhaler Inhale into the lungs.  . [DISCONTINUED] estradiol (CLIMARA - DOSED IN MG/24 HR) 0.05 mg/24hr patch Place onto the  skin.  . [DISCONTINUED] guaiFENesin (MUCINEX) 600 MG 12 hr tablet Take by mouth.  . [DISCONTINUED] loratadine (CLARITIN) 10 MG tablet Take by mouth.  . [DISCONTINUED] Multiple Vitamin (MULTI-VITAMINS) TABS Take by mouth.  . [DISCONTINUED] Multiple Vitamin (MULTIVITAMIN) tablet Take 1 tablet by mouth daily.    . [DISCONTINUED] traMADol (ULTRAM) 50 MG tablet TAKE 1 TO 2 TABLETS BY MOUTH EVERY 4 TO 6 HOURS AS NEEDED FOR PAIN (MAX 400MG/DAY)   No facility-administered encounter medications on file as of 10/24/2015.    Allergy:  Allergies  Allergen Reactions  . Latex Itching  . Oxycodone Hcl     REACTION: itching    Social Hx:   Social History   Social History  . Marital Status: Married    Spouse Name: N/A  . Number of Children: N/A  . 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 Smoking status: Former Smoker -- 6 years    Quit date: 12/28/1996  . Smokeless tobacco: Not on file     Comment: Exposed to second hand smoke  . Alcohol Use: No  . Drug Use: No  . Sexual Activity: Yes   Other Topics Concern  .  Not on file   Social History Narrative   Married with 3 children   3-4 cups caffeine per day   Exercises 2-3 times per week    Past Surgical Hx:  Past Surgical History  Procedure Laterality Date  . Breast biopsy  1985, 2006    Benign  . Total abdominal hysterectomy  1992  . Knee arthroscopy      x3  . Tonsillectomy    . Cholecystectomy      Past Medical Hx:  Past Medical History  Diagnosis Date  . ADD (attention deficit disorder with hyperactivity)   . PTSD (post-traumatic stress disorder)   . Panic disorder   . GERD (gastroesophageal reflux disease)   . Hyperlipidemia   . Asthma   . Sleep apnea   . Allergic rhinitis   . Allergy   . Sleep apnea     patient reports using CPAP machine at night    Family Hx:  Family History  Problem Relation Age of Onset  . COPD Mother   . Arthritis Mother   .  Depression Mother   . Anxiety disorder Mother   . COPD Father   . Alcohol abuse Father   . Heart disease Father   . Diabetes Father   . Asthma Sister   . Cancer Sister   . Asthma Sister   . Diabetes Sister   . Cancer Sister   . Sleep apnea Sister   . Hyperlipidemia Other   . Allergies Other   . Obesity Other   . Anxiety disorder Other   . Arthritis Other     Vitals:  Blood pressure 127/66, pulse 79, temperature 98.3 F (36.8 C), temperature source Oral, resp. rate 18, height _0  (1.651 m), weight 245 lb 8 oz (111.358 kg). O2 97% on RA  Physical Exam:  General: Well developed, well nourished female in no acute distress. Alert and oriented x 3.  Cardiovascular: Regular rate and rhythm. S1 and S2 normal.  Lungs: Clear to auscultation bilaterally. No wheezes/crackles/rhonchi noted.  Skin: No rashes or lesions present. Back: No CVA tenderness.  Mild tenderness reported with light palpation of the right upper back. Abdomen: Abdomen soft, non-tender and obese. Active bowel sounds in all quadrants. No evidence of a fluid wave or abdominal masses.  25 staples removed from the midline without difficulty.  1/2 inch steri strips applied.  Erythema noted along the lower aspect of the incision spanning 3 cm outward.  Incision intact with no evidence of separation at this time.  Slightly increase warmth.  No drainage or bleeding noted.    Extremities: No bilateral cyanosis, edema, or clubbing.  Negative Homan's sign with LLE.  No edema, erythema, or calf tenderness with palpation of the left LE.    Assessment/Plan:  Liberta Gimpel is a 54 year old s/p exploratory laparotomy, bilateral salpingo-oophorectomy by Dr. Fermin Schwab at [facility] on 10/15/15 for a benign sclerosing stromal tumor.  Plan to begin keflex 500 mg TID for seven days to treat incisional cellulitis.  She is to monitor the incisional erythema/increased warmth along with her temperature and call the office for continued elevated  temperatures, persistent erythema, development of drainage, etc.  Advised patient that a chest xray could be ordered due to increase in shortness of breath along with right lateral back/abdominal discomfort with deep inspiration and with her history of a trace right pleural effusion, but she wanted to hold off at this time.  Also discussed the left calf discomfort with the option to  check a doppler to rule out DVT but she also wanted to wait on ordering this.  Reportable signs and symptoms reviewed.  Post-operative restrictions reinforced and patient advised to continue method of abdominal support including the abdominal binder or control top undergarments.  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  Advised to please contact the office for any worsening of symptoms or to see care at the Emergency Room.          Loney Domingo DEAL, NP 10/25/2015, 3:04 PM

## 2015-11-04 ENCOUNTER — Emergency Department (HOSPITAL_COMMUNITY): Payer: PRIVATE HEALTH INSURANCE

## 2015-11-04 ENCOUNTER — Encounter: Payer: Self-pay | Admitting: Gynecologic Oncology

## 2015-11-04 ENCOUNTER — Encounter (HOSPITAL_COMMUNITY): Payer: Self-pay | Admitting: Emergency Medicine

## 2015-11-04 ENCOUNTER — Emergency Department (HOSPITAL_COMMUNITY)
Admission: EM | Admit: 2015-11-04 | Discharge: 2015-11-04 | Disposition: A | Discharge status: Home / Self Care | Payer: PRIVATE HEALTH INSURANCE | Attending: Emergency Medicine | Admitting: Emergency Medicine

## 2015-11-04 ENCOUNTER — Telehealth: Payer: Self-pay

## 2015-11-04 DIAGNOSIS — F410 Panic disorder [episodic paroxysmal anxiety] without agoraphobia: Secondary | ICD-10-CM | POA: Insufficient documentation

## 2015-11-04 DIAGNOSIS — Z91040 Latex allergy status: Secondary | ICD-10-CM | POA: Diagnosis not present

## 2015-11-04 DIAGNOSIS — G4730 Sleep apnea, unspecified: Secondary | ICD-10-CM | POA: Insufficient documentation

## 2015-11-04 DIAGNOSIS — R079 Chest pain, unspecified: Secondary | ICD-10-CM | POA: Diagnosis not present

## 2015-11-04 DIAGNOSIS — F909 Attention-deficit hyperactivity disorder, unspecified type: Secondary | ICD-10-CM | POA: Diagnosis not present

## 2015-11-04 DIAGNOSIS — Z79899 Other long term (current) drug therapy: Secondary | ICD-10-CM | POA: Insufficient documentation

## 2015-11-04 DIAGNOSIS — Z87891 Personal history of nicotine dependence: Secondary | ICD-10-CM | POA: Insufficient documentation

## 2015-11-04 DIAGNOSIS — K219 Gastro-esophageal reflux disease without esophagitis: Secondary | ICD-10-CM | POA: Diagnosis not present

## 2015-11-04 DIAGNOSIS — F4310 Post-traumatic stress disorder, unspecified: Secondary | ICD-10-CM | POA: Diagnosis not present

## 2015-11-04 DIAGNOSIS — J45909 Unspecified asthma, uncomplicated: Secondary | ICD-10-CM | POA: Insufficient documentation

## 2015-11-04 DIAGNOSIS — Z7951 Long term (current) use of inhaled steroids: Secondary | ICD-10-CM | POA: Diagnosis not present

## 2015-11-04 DIAGNOSIS — E785 Hyperlipidemia, unspecified: Secondary | ICD-10-CM | POA: Insufficient documentation

## 2015-11-04 LAB — HEPATIC FUNCTION PANEL
ALT: 22 U/L (ref 14–54)
AST: 23 U/L (ref 15–41)
Albumin: 4.1 g/dL (ref 3.5–5.0)
Alkaline Phosphatase: 64 U/L (ref 38–126)
Bilirubin, Direct: <0.1 mg/dL — ABNORMAL LOW (ref 0.1–0.5)
Total Bilirubin: 0.8 mg/dL (ref 0.3–1.2)
Total Protein: 6.6 g/dL (ref 6.5–8.1)

## 2015-11-04 LAB — BASIC METABOLIC PANEL
Anion gap: 7 (ref 5–15)
BUN: 15 mg/dL (ref 6–20)
CO2: 26 mmol/L (ref 22–32)
Calcium: 8.9 mg/dL (ref 8.9–10.3)
Chloride: 109 mmol/L (ref 101–111)
Creatinine, Ser: 0.98 mg/dL (ref 0.44–1.00)
GFR calc Af Amer: >60 mL/min (ref >60)
GFR calc non Af Amer: >60 mL/min (ref >60)
Glucose, Bld: 156 mg/dL — ABNORMAL HIGH (ref 65–99)
Potassium: 3.5 mmol/L (ref 3.5–5.1)
Sodium: 142 mmol/L (ref 135–145)

## 2015-11-04 LAB — CBC
HCT: 40.7 % (ref 36.0–46.0)
Hemoglobin: 13.6 g/dL (ref 12.0–15.0)
MCH: 29.4 pg (ref 26.0–34.0)
MCHC: 33.4 g/dL (ref 30.0–36.0)
MCV: 87.9 fL (ref 78.0–100.0)
Platelets: 379 10*3/uL (ref 150–400)
RBC: 4.63 MIL/uL (ref 3.87–5.11)
RDW: 13 % (ref 11.5–15.5)
WBC: 4.4 10*3/uL (ref 4.0–10.5)

## 2015-11-04 LAB — I-STAT TROPONIN, ED: Troponin i, poc: 0 ng/mL (ref 0.00–0.08)

## 2015-11-04 MED ORDER — IOHEXOL 350 MG/ML SOLN
100.0000 mL | Freq: Once | INTRAVENOUS | Status: AC | PRN
  Administered 2015-11-04: 100 mL via INTRAVENOUS

## 2015-11-04 NOTE — ED Notes (Signed)
Per pt, states she had surgery on the 18th-states chest pain then-has been having on and off-history of lung disease-states chest pain woke her up 3 times last night

## 2015-11-04 NOTE — Discharge Instructions (Signed)
Nonspecific Chest Pain  °Chest pain can be caused by many different conditions. There is always a chance that your pain could be related to something serious, such as a heart attack or a blood clot in your lungs. Chest pain can also be caused by conditions that are not life-threatening. If you have chest pain, it is very important to follow up with your health care provider. °CAUSES  °Chest pain can be caused by: °· Heartburn. °· Pneumonia or bronchitis. °· Anxiety or stress. °· Inflammation around your heart (pericarditis) or lung (pleuritis or pleurisy). °· A blood clot in your lung. °· A collapsed lung (pneumothorax). It can develop suddenly on its own (spontaneous pneumothorax) or from trauma to the chest. °· Shingles infection (varicella-zoster virus). °· Heart attack. °· Damage to the bones, muscles, and cartilage that make up your chest wall. This can include: °¨ Bruised bones due to injury. °¨ Strained muscles or cartilage due to frequent or repeated coughing or overwork. °¨ Fracture to one or more ribs. °¨ Sore cartilage due to inflammation (costochondritis). °RISK FACTORS  °Risk factors for chest pain may include: °· Activities that increase your risk for trauma or injury to your chest. °· Respiratory infections or conditions that cause frequent coughing. °· Medical conditions or overeating that can cause heartburn. °· Heart disease or family history of heart disease. °· Conditions or health behaviors that increase your risk of developing a blood clot. °· Having had chicken pox (varicella zoster). °SIGNS AND SYMPTOMS °Chest pain can feel like: °· Burning or tingling on the surface of your chest or deep in your chest. °· Crushing, pressure, aching, or squeezing pain. °· Dull or sharp pain that is worse when you move, cough, or take a deep breath. °· Pain that is also felt in your back, neck, shoulder, or arm, or pain that spreads to any of these areas. °Your chest pain may come and go, or it may stay  constant. °DIAGNOSIS °Lab tests or other studies may be needed to find the cause of your pain. Your health care provider may have you take a test called an ambulatory ECG (electrocardiogram). An ECG records your heartbeat patterns at the time the test is performed. You may also have other tests, such as: °· Transthoracic echocardiogram (TTE). During echocardiography, sound waves are used to create a picture of all of the heart structures and to look at how blood flows through your heart. °· Transesophageal echocardiogram (TEE). This is a more advanced imaging test that obtains images from inside your body. It allows your health care provider to see your heart in finer detail. °· Cardiac monitoring. This allows your health care provider to monitor your heart rate and rhythm in real time. °· Holter monitor. This is a portable device that records your heartbeat and can help to diagnose abnormal heartbeats. It allows your health care provider to track your heart activity for several days, if needed. °· Stress tests. These can be done through exercise or by taking medicine that makes your heart beat more quickly. °· Blood tests. °· Imaging tests. °TREATMENT  °Your treatment depends on what is causing your chest pain. Treatment may include: °· Medicines. These may include: °¨ Acid blockers for heartburn. °¨ Anti-inflammatory medicine. °¨ Pain medicine for inflammatory conditions. °¨ Antibiotic medicine, if an infection is present. °¨ Medicines to dissolve blood clots. °¨ Medicines to treat coronary artery disease. °· Supportive care for conditions that do not require medicines. This may include: °¨ Resting. °¨ Applying heat   or cold packs to injured areas. °¨ Limiting activities until pain decreases. °HOME CARE INSTRUCTIONS °· If you were prescribed an antibiotic medicine, finish it all even if you start to feel better. °· Avoid any activities that bring on chest pain. °· Do not use any tobacco products, including  cigarettes, chewing tobacco, or electronic cigarettes. If you need help quitting, ask your health care provider. °· Do not drink alcohol. °· Take medicines only as directed by your health care provider. °· Keep all follow-up visits as directed by your health care provider. This is important. This includes any further testing if your chest pain does not go away. °· If heartburn is the cause for your chest pain, you may be told to keep your head raised (elevated) while sleeping. This reduces the chance that acid will go from your stomach into your esophagus. °· Make lifestyle changes as directed by your health care provider. These may include: °¨ Getting regular exercise. Ask your health care provider to suggest some activities that are safe for you. °¨ Eating a heart-healthy diet. A registered dietitian can help you to learn healthy eating options. °¨ Maintaining a healthy weight. °¨ Managing diabetes, if necessary. °¨ Reducing stress. °SEEK MEDICAL CARE IF: °· Your chest pain does not go away after treatment. °· You have a rash with blisters on your chest. °· You have a fever. °SEEK IMMEDIATE MEDICAL CARE IF:  °· Your chest pain is worse. °· You have an increasing cough, or you cough up blood. °· You have severe abdominal pain. °· You have severe weakness. °· You faint. °· You have chills. °· You have sudden, unexplained chest discomfort. °· You have sudden, unexplained discomfort in your arms, back, neck, or jaw. °· You have shortness of breath at any time. °· You suddenly start to sweat, or your skin gets clammy. °· You feel nauseous or you vomit. °· You suddenly feel light-headed or dizzy. °· Your heart begins to beat quickly, or it feels like it is skipping beats. °These symptoms may represent a serious problem that is an emergency. Do not wait to see if the symptoms will go away. Get medical help right away. Call your local emergency services (911 in the [place] Do not drive yourself to the hospital. °  °This  information is not intended to replace advice given to you by your health care provider. Make sure you discuss any questions you have with your health care provider. °  °Document Released: 09/23/2005 Document Revised: 01/04/2015 Document Reviewed: 07/20/2014 °Elsevier Interactive Patient Education ©2016 [employer] ° °

## 2015-11-04 NOTE — ED Provider Notes (Signed)
CSN: [id number]     Arrival date & time 11/04/15  1025 History   First MD Initiated Contact with Patient 11/04/15 1115     Chief Complaint  Patient presents with  . Chest Pain   HPI Patient presents to the emergency room with complaints of shortness of breath. The patient's symptoms started over a month ago. 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  Patient states associated with that illness she had developed a small pleural effusion. She had been having intermittent episodes of pain on the right side of her chest. Patient attributed the symptoms to the tumor. She mentioned it to her doctor and they were observing her symptoms. The symptoms had been improving since the surgery until yesterday when she began having increasing sharp pain on the right side of her chest. Patient states [place] when she suddenly had sharp pain on the right side of her's chest that made her stop.  She became somewhat diaphoretic and short of breath. It hurts to take a deep breath.  She had episodes last night of sharp right-sided chest pain. They woke her up from sleep. This morning she is pain-free. She called her surgeon and was told to come to the emergency department. Past Medical History  Diagnosis Date  . ADD (attention deficit disorder with hyperactivity)   . PTSD (post-traumatic stress disorder)   . Panic disorder   . GERD (gastroesophageal reflux disease)   . Hyperlipidemia   . Asthma   . Sleep apnea   . Allergic rhinitis   . Allergy   . Sleep apnea     patient reports using CPAP machine at night   Past Surgical History  Procedure Laterality Date  . Breast biopsy  1985, 2006    Benign  . Total abdominal hysterectomy  1992  . Knee arthroscopy      x3  . Tonsillectomy    . Cholecystectomy     Family History  Problem Relation Age of Onset  . COPD Mother   . Arthritis Mother   . Depression Mother   . Anxiety  disorder Mother   . COPD Father   . Alcohol abuse Father   . Heart disease Father   . Diabetes Father   . Asthma Sister   . Cancer Sister   . Asthma Sister   . Diabetes Sister   . Cancer Sister   . Sleep apnea Sister   . Hyperlipidemia Other   . Allergies Other   . Obesity Other   . Anxiety disorder Other   . Arthritis Other    Social History  Substance Use Topics  . Smoking status: Former Smoker -- 6 years    Quit date: 12/28/1996  . Smokeless tobacco: None     Comment: Exposed to second hand smoke  . Alcohol Use: No   OB History    No data available     Review of Systems  All other systems reviewed and are negative.     Allergies  Latex and Oxycodone hcl  Home Medications   Prior to Admission medications   Medication Sig Start Date End Date Taking? Authorizing Provider  albuterol (PROVENTIL HFA;VENTOLIN HFA) 108 (90 BASE) MCG/ACT inhaler Inhale 2 puffs into the lungs every 6 (six) hours as needed for wheezing or shortness of breath.    Yes Historical Provider, MD  budesonide-formoterol (SYMBICORT) 160-4.5 MCG/ACT inhaler Inhale 2 puffs into the lungs  2 (two) times daily.  02/15/15  Yes Historical Provider, MD  buPROPion (WELLBUTRIN XL) 150 MG 24 hr tablet Take 150 mg by mouth daily.   Yes Historical Provider, MD  Cholecalciferol (VITAMIN D3) 2000 UNITS capsule Take 2,000 Units by mouth daily.    Yes Historical Provider, MD  desvenlafaxine (PRISTIQ) 50 MG 24 hr tablet Take 25 mg by mouth daily.  02/05/15  Yes Historical Provider, MD  dexlansoprazole (DEXILANT) 60 MG capsule Take 60 mg by mouth daily.    Yes Historical Provider, MD  ezetimibe (ZETIA) 10 MG tablet Take 10 mg by mouth daily.  12/05/14  Yes Historical Provider, MD  guaiFENesin (MUCINEX) 600 MG 12 hr tablet Take 1,200 mg by mouth daily as needed for cough or to loosen phlegm.    Yes Historical Provider, MD  ibuprofen (ADVIL,MOTRIN) 600 MG tablet Take 600 mg by mouth every 6 (six) hours as needed for  headache, mild pain or moderate pain.   Yes Historical Provider, MD  loratadine (CLARITIN) 10 MG tablet Take 10 mg by mouth daily as needed for allergies.    Yes Historical Provider, MD  Magnesium Oxide 500 MG TABS Take 500 mg by mouth at bedtime.    Yes Historical Provider, MD  montelukast (SINGULAIR) 10 MG tablet Take 10 mg by mouth at bedtime.  07/22/12  Yes Historical Provider, MD  ranitidine (ZANTAC) 300 MG capsule Take 300 mg by mouth at bedtime.    Yes Historical Provider, MD  senna (SENOKOT) 8.6 MG tablet Take 1 tablet by mouth daily as needed for constipation.  10/17/15 11/16/15 Yes Historical Provider, MD  triamterene-hydrochlorothiazide (MAXZIDE-25) 37.5-25 MG tablet Take 1 tablet by mouth daily as needed (fluid).  12/12/14  Yes Historical Provider, MD  cephALEXin (KEFLEX) 500 MG capsule Take 1 capsule (500 mg total) by mouth 3 (three) times daily. Patient not taking: Reported on 11/04/2015 10/24/15   Melissa D Cross, NP   BP 141/69 mmHg  Pulse 75  Temp(Src) 97.7 F (36.5 C) (Oral)  Resp 18  SpO2 97% Physical Exam  Constitutional: She appears well-developed and well-nourished. No distress.  HENT:  Head: Normocephalic and atraumatic.  Right Ear: External ear normal.  Left Ear: External ear normal.  Eyes: Conjunctivae are normal. Right eye exhibits no discharge. Left eye exhibits no discharge. No scleral icterus.  Neck: Neck supple. No tracheal deviation present.  Cardiovascular: Normal rate, regular rhythm and intact distal pulses.   Pulmonary/Chest: Effort normal. No stridor. No respiratory distress. She has no wheezes. She has no rales.  Decreased breath sounds in the right side  Abdominal: Soft. Bowel sounds are normal. She exhibits no distension. There is no tenderness. There is no rebound and no guarding.  Mild peri-incisional tenderness  Musculoskeletal: She exhibits no edema or tenderness.  Neurological: She is alert. She has normal strength. No cranial nerve deficit (no  facial droop, extraocular movements intact, no slurred speech) or sensory deficit. She exhibits normal muscle tone. She displays no seizure activity. Coordination normal.  Skin: Skin is warm and dry. No rash noted.  Psychiatric: She has a normal mood and affect.  Nursing note and vitals reviewed.   ED Course  Procedures (including critical care time) Labs Review Labs Reviewed  BASIC METABOLIC PANEL - Abnormal; Notable for the following:    Glucose, Bld 156 (*)    All other components within normal limits  HEPATIC FUNCTION PANEL - Abnormal; Notable for the following:    Bilirubin, Direct <0.1 (*)  All other components within normal limits  CBC  I-STAT TROPOININ, ED    Imaging Review Dg Chest 2 View  11/04/2015  CLINICAL DATA:  Chest pain and shortness of breath, chronic EXAM: CHEST  2 VIEW COMPARISON:  March 17, 2015 an September 05, 2015 chest radiograph; chest CT January 29, 2015 FINDINGS: Widespread reticulonodular disease throughout the lungs with an upper lobe predominance persists without change. There is no new opacity. No airspace consolidation. The heart size and pulmonary vascularity are normal. There is mild left hilar adenopathy. Adenopathy and other areas demonstrated on prior CT are not well seen by CT. There does not appear to be progression of adenopathy since prior CT examination. No bone lesions. IMPRESSION: Widespread reticulonodular interstitial disease with upper lobe predominance and areas of adenopathy. Suspect sarcoidosis, although differential considerations must include pneumoconiosis, atypical chronic infection, or neoplasia. The appearance is stable compared to prior studies. Electronically Signed   By: Lowella Grip III M.D.   On: 11/04/2015 11:26   Ct Angio Chest Pe W/cm &/or Wo Cm  11/04/2015  CLINICAL DATA:  54 year old female with right scapular and right upper chest pain, shortness of breath and cough since undergoing exploratory laparotomy with fibroid  removal on 10/15/2015. EXAM: CT ANGIOGRAPHY CHEST WITH CONTRAST TECHNIQUE: Multidetector CT imaging of the chest was performed using the standard protocol during bolus administration of intravenous contrast. Multiplanar CT image reconstructions and MIPs were obtained to evaluate the vascular anatomy. CONTRAST:  137mL OMNIPAQUE IOHEXOL 350 MG/ML SOLN COMPARISON:  Prior CT abdomen/ pelvis 10/01/2015; most recent prior CT scan of the chest 01/29/2015 FINDINGS: Mediastinum: Similar degree of mediastinal and bi hilar adenopathy compared to prior imaging. The index right paratracheal station lymph node is again measured at 18 mm while an index aortopulmonary window node measures 13 mm. Unremarkable thoracic esophagus. Stable sub cm low-attenuation nodule within the left thyroid gland measuring up to 8 mm. Thyroid nodules less than 1.5 cm in diameter noted incidentally require no dedicated imaging follow-up. The thoracic inlet is otherwise unremarkable. Heart/Vascular: Adequate opacification of the pulmonary arteries to the segmental level. No central filling defect to suggest acute pulmonary embolus. Four vessel aortic arch anatomy. The left vertebral artery arises directly from the aorta. Main pulmonary artery is within normal limits for size. No definite coronary artery calcification. The heart is within normal limits for size. No pericardial effusion. Lungs/Pleura: Slight interval progression of extensive and innumerable nodular opacities in a predominantly tree-in-bud, and subpleural pattern with a bilateral upper lung predominant distribution. The nodules range in size from 1-2 mm to 11 mm. There are areas were the nodules are developing into a consolidated opacity. For example, the developing consolidation in the posterior aspect of the left upper lobe has increased compared to prior imaging presently measuring 2.3 x 2.6 cm compared to 1.7 x 1.6 cm. Trace right pleural effusion. Bones/Soft Tissues: No acute fracture  or aggressive appearing lytic or blastic osseous lesion. Upper Abdomen: Visualized upper abdominal organs are unremarkable. Review of the MIP images confirms the above findings. IMPRESSION: 1. Negative for acute pulmonary embolus. 2. Slight interval progression progression of bilateral upper lung predominant reticulonodular pulmonary parenchymal disease and mediastinal adenopathy compared to February of 2016. 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 3. Trace right-sided pleural effusion. Electronically Signed   By: Jacqulynn Cadet M.D.   On: 11/04/2015 13:19   I have personally reviewed and evaluated these images and lab results as part of my medical decision-making.   EKG  Interpretation   Date/Time:  Monday November 04 2015 10:40:01 EST Ventricular Rate:  88 PR Interval:  170 QRS Duration: 94 QT Interval:  363 QTC Calculation: 439 R Axis:   51 Text Interpretation:  Sinus rhythm Low voltage, precordial leads Minimal  ST elevation, inferior leads Baseline wander in lead(s) II III aVF V1 V2  V3 V5 No previous tracing Confirmed by Zala Degrasse  MD-J, Gonsalo Cuthbertson ([id number]) on  11/04/2015 10:47:40 AM      MDM   Final diagnoses:  Chest pain, unspecified chest pain type   the patient's laboratory tests are reassuring. CT scan does not show evidence of pulmonary embolism. I doubt the symptoms are related to cardiac disease. Symptoms could be related to her sarcoidosis.  Patient will follow up with her primary care doctor. Monitor for worsening symptoms.    Dorie Rank, MD 11/04/15 [phone]

## 2015-11-04 NOTE — ED Notes (Signed)
Bed: WA18 Expected date:  Expected time:  Means of arrival:  Comments: Hold for triage 1 

## 2015-11-04 NOTE — Telephone Encounter (Signed)
Patient called to update Dr Rob Hickman that she was having some "SOB" with  "sharp" right sided pain . Melissa Cross, APNP updated , orders received to have the patient got to the ED for evaluation. Patient states understanding , denies further questions .

## 2015-11-12 ENCOUNTER — Telehealth: Payer: Self-pay | Admitting: Critical Care Medicine

## 2015-11-12 ENCOUNTER — Encounter: Payer: Self-pay | Admitting: Gynecology

## 2015-11-12 NOTE — Telephone Encounter (Signed)
Spoke with pt, states that she would like "bipolar disorder" dx removed from her chart.  Pt states this was added to her chart in 2009 by PW before she had a definitive dx, which her workup showed she does not have bipolar disorder.  I advised pt that PW is no longer practicing with our clinic and that I would forward this message to our supervisor.  Pt understands, and states that if anything is needed on her end to make this happen she is happy to oblige.    Spoke with Maryann Conners about this-states that since [facility] is still helping PW with his paperwork, she would be my best resource to see if he would amend pt's chart.  Crystal, please advise.  Thanks!

## 2015-11-13 NOTE — Telephone Encounter (Signed)
I will discuss with PW, however he is off this week.  Will discuss with him next week.

## 2015-11-13 NOTE — Telephone Encounter (Signed)
Crystal, has this been taken care of?  Please advise.

## 2015-11-15 ENCOUNTER — Encounter: Payer: Self-pay | Admitting: Gynecology

## 2015-11-15 ENCOUNTER — Ambulatory Visit: Payer: PRIVATE HEALTH INSURANCE | Attending: Gynecology | Admitting: Gynecology

## 2015-11-15 VITALS — BP 137/66 | HR 102 | Temp 98.2°F | Resp 19 | Ht 65.0 in | Wt 242.1 lb

## 2015-11-15 DIAGNOSIS — N83299 Other ovarian cyst, unspecified side: Secondary | ICD-10-CM | POA: Insufficient documentation

## 2015-11-15 DIAGNOSIS — Z86018 Personal history of other benign neoplasm: Secondary | ICD-10-CM | POA: Diagnosis not present

## 2015-11-15 DIAGNOSIS — D270 Benign neoplasm of right ovary: Secondary | ICD-10-CM | POA: Diagnosis not present

## 2015-11-15 NOTE — Patient Instructions (Signed)
You are free to resume all activities.  Please call for any questions or concerns.

## 2015-11-15 NOTE — Progress Notes (Signed)
Consult Note: Gyn-Onc   Norma Lopez 54 y.o. female  No chief complaint on file.   Assessment : Status post bilateral salpingo-oophorectomy on 10/15/2015. 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  Plan: Patient is had a good postoperative recovery and is now given the okay to return to full levels of activity including returning to work on 12/02/2015. She will return to the care of her primary gynecologist, Dr. Dellis Filbert, for annual visits..    Interval history: Patient returns today for six-week postoperative checkup. She underwent exploratory laparotomy on 10/15/2015 at [facility]. 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 The patient's had on, became postoperative course except for a small cellulitis and the incision treated with Keflex. Currently she is doing well. She denies any GI or GU symptoms are functional status is improving although recently she developed pneumonia and is still on antibiotics.  HPI: 54 year old white female gravida 3 seen in consultation request of Dr. Dellis Filbert regarding management of a newly diagnosed complex pelvic mass and associated elevated CA-125. The patient has had 2 months of abdominal pain especially on the right side. Initially she attributed it to diverticulitis. However evaluation included a CT scan which showed a 10.6 x 8.1 x 6.7 cm right adnexal mass is complex in nature. Left ovary is unremarkable. (Patient previously has had a hysterectomy for uterine fibroids) there is a small amount of intraperitoneal ascites. CA-125 value is 104.9 units per mL. CEA 2.1.  The patient has sarcoidosis and has been on prednisone between May and until recently when she went on a prednisone taper because of weight gain. She uses aCPAP machine at night. Patient is a Marine scientist working in radiology.  The patient has no family history of ovarian or breast cancer..  Review of  Systems:10 point review of systems is negative except as noted in interval history.   Vitals: Blood pressure 137/66, pulse 102, temperature 98.2 F (36.8 C), temperature source Oral, resp. rate 19, height 5\' 5"  (1.651 m), weight 242 lb 1.6 oz (109.816 kg), SpO2 98 %.  Physical Exam: General : The patient is a healthy woman in no acute distress.  HEENT: normocephalic, extraoccular movements normal; neck is supple without thyromegally  Lynphnodes: Supraclavicular and inguinal nodes not enlarged  Abdomen: Obese, Soft, non-tender, no ascites, no organomegally, no masses, no hernias midline incision is well-healed. Pelvic:  EGBUS: Normal female  Vagina: Normal, no lesions  Urethra and Bladder: Normal, non-tender  Cervix: Surgically absent  Uterus: Surgically absent  Bi-manual examination: Non-tender; no adenxal masses or nodularity  Rectal: normal sphincter tone, no masses, no blood  Lower extremities: No edema or varicosities. Normal range of motion      Allergies  Allergen Reactions  . Latex Itching  . Oxycodone Hcl     REACTION: itching    Past Medical History  Diagnosis Date  . ADD (attention deficit disorder with hyperactivity)   . PTSD (post-traumatic stress disorder)   . Panic disorder   . GERD (gastroesophageal reflux disease)   . Hyperlipidemia   . Asthma   . Sleep apnea   . Allergic rhinitis   . Allergy   . Sleep apnea     patient reports using CPAP machine at night    Past Surgical History  Procedure Laterality Date  . Breast biopsy  1985, 2006    Benign  . Total abdominal hysterectomy  1992  .  Knee arthroscopy      x3  . Tonsillectomy    . Cholecystectomy      Current Outpatient Prescriptions  Medication Sig Dispense Refill  . albuterol (PROVENTIL HFA;VENTOLIN HFA) 108 (90 BASE) MCG/ACT inhaler Inhale 2 puffs into the lungs every 6 (six) hours as needed for wheezing or shortness of breath.     . budesonide-formoterol (SYMBICORT) 160-4.5 MCG/ACT  inhaler Inhale 2 puffs into the lungs 2 (two) times daily.     Marland Kitchen buPROPion (WELLBUTRIN XL) 150 MG 24 hr tablet Take 150 mg by mouth daily.    . Cholecalciferol (VITAMIN D3) 2000 UNITS capsule Take 1,000 Units by mouth daily.     Marland Kitchen desvenlafaxine (PRISTIQ) 50 MG 24 hr tablet Take 25 mg by mouth daily.     Marland Kitchen dexlansoprazole (DEXILANT) 60 MG capsule Take 60 mg by mouth daily.     Marland Kitchen doxycycline (VIBRA-TABS) 100 MG tablet Take 100 mg by mouth every 12 (twelve) hours.  0  . ezetimibe (ZETIA) 10 MG tablet Take 10 mg by mouth daily.     Marland Kitchen guaiFENesin (MUCINEX) 600 MG 12 hr tablet Take 1,200 mg by mouth daily as needed for cough or to loosen phlegm.     Marland Kitchen ibuprofen (ADVIL,MOTRIN) 600 MG tablet Take 600 mg by mouth every 6 (six) hours as needed for headache, mild pain or moderate pain.    Marland Kitchen loratadine (CLARITIN) 10 MG tablet Take 10 mg by mouth daily as needed for allergies.     . Magnesium Oxide 500 MG TABS Take 500 mg by mouth at bedtime.     . montelukast (SINGULAIR) 10 MG tablet Take 10 mg by mouth at bedtime.     . predniSONE (DELTASONE) 10 MG tablet TAKE 4 TABS FOR 3 DAYS,3 TABS FOR 3 DAYS, 2 TABS FOR 3 DAYS THEN 1 TAB FOR 3 DAYS THEN STOP  0  . PRISTIQ 25 MG TB24 Take 1 tablet by mouth daily.  3  . ranitidine (ZANTAC) 300 MG capsule Take 300 mg by mouth at bedtime.     . senna (SENOKOT) 8.6 MG tablet Take 1 tablet by mouth daily as needed for constipation.     . triamterene-hydrochlorothiazide (MAXZIDE-25) 37.5-25 MG tablet Take 1 tablet by mouth daily as needed (fluid).      No current facility-administered medications for this visit.    Social History   Social History  . Marital Status: Married    Spouse Name: N/A  . Number of Children: N/A  . 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 Smoking status: Former Smoker -- 6 years    Quit date: 12/28/1996  . Smokeless tobacco: Not on file     Comment: Exposed to second hand smoke  .  Alcohol Use: No  . Drug Use: No  . Sexual Activity: Yes   Other Topics Concern  . Not on file   Social History Narrative   Married with 3 children   3-4 cups caffeine per day   Exercises 2-3 times per week    Family History  Problem Relation Age of Onset  . COPD Mother   . Arthritis Mother   . Depression Mother   . Anxiety disorder Mother   . COPD Father   . Alcohol abuse Father   . Heart disease Father   . Diabetes Father   . Asthma Sister   . Cancer Sister   .  Asthma Sister   . Diabetes Sister   . Cancer Sister   . Sleep apnea Sister   . Hyperlipidemia Other   . Allergies Other   . Obesity Other   . Anxiety disorder Other   . Arthritis Other       CLARKE-PEARSON,Nickcole Bralley L, MD 11/15/2015, 8:52 AM

## 2015-11-18 NOTE — Telephone Encounter (Signed)
Spoke with Crystal, who advised she has not yet discussed this with PW as he was off last week, but that this will be discussed when he returns to office.   Spoke with pt to relay this info to her.  Pt expressed understanding.  I advised we would contact pt after we speak to PW to keep her updated.  Will forward back to Crystal to follow up on.

## 2015-11-25 NOTE — Telephone Encounter (Signed)
Crystal - have you had a chance to talk to PW regarding this yet?  Please advise.

## 2015-11-26 NOTE — Telephone Encounter (Signed)
After further investigation, there is a process to be followed when pts are asking to make changes in their medical records.  Pt will need to complete an Amendment Request Form.  We can mail this to her, or she can print from [facility].  The completed form will need to go to Medical Records (either pt can send directly to [place], [place] [id number] or she can bring it by the office and we can send).  Once Medical Records receives request, they will research and will send this information to the provider to approve or deny the request.  Process can take 30-60 days from the day form is received by Medical Records.    [email or web address] for pt to discuss above

## 2015-11-27 ENCOUNTER — Encounter: Payer: Self-pay | Admitting: Gynecologic Oncology

## 2015-11-27 NOTE — Telephone Encounter (Signed)
Spoke with pt.  Discussed below.  Pt verbalized understanding and was very understanding of process to follow.  Pt directed to Amendment Form on [facility] website, and states she will mail this directly to Medical Records at the address below.  Pt understands the timeframe will start the day form is received by Medical Records.  She is aware she can contact our office or Medical Records if she has any further questions.  She verbalized understanding, is in agreement with plan, was very appreciative of our help, and voiced no further questions or concerns at this time.

## 2015-11-28 ENCOUNTER — Encounter: Payer: Self-pay | Admitting: Gynecologic Oncology

## 2015-12-02 ENCOUNTER — Telehealth: Payer: Self-pay | Admitting: Critical Care Medicine

## 2015-12-02 NOTE — Telephone Encounter (Signed)
Amendment request received on November 29, 2015. Documents forwarded to Asencion Noble MD   [facility]

## 2016-01-13 DIAGNOSIS — R0609 Other forms of dyspnea: Secondary | ICD-10-CM

## 2016-01-13 DIAGNOSIS — R918 Other nonspecific abnormal finding of lung field: Secondary | ICD-10-CM | POA: Insufficient documentation

## 2016-01-13 DIAGNOSIS — E669 Obesity, unspecified: Secondary | ICD-10-CM | POA: Insufficient documentation

## 2016-01-13 DIAGNOSIS — R419 Unspecified symptoms and signs involving cognitive functions and awareness: Secondary | ICD-10-CM

## 2016-01-13 DIAGNOSIS — N951 Menopausal and female climacteric states: Secondary | ICD-10-CM

## 2016-01-13 DIAGNOSIS — F332 Major depressive disorder, recurrent severe without psychotic features: Secondary | ICD-10-CM | POA: Insufficient documentation

## 2016-01-13 DIAGNOSIS — K4090 Unilateral inguinal hernia, without obstruction or gangrene, not specified as recurrent: Secondary | ICD-10-CM

## 2016-01-13 DIAGNOSIS — H2513 Age-related nuclear cataract, bilateral: Secondary | ICD-10-CM

## 2016-01-13 DIAGNOSIS — E119 Type 2 diabetes mellitus without complications: Secondary | ICD-10-CM

## 2016-01-13 DIAGNOSIS — H04123 Dry eye syndrome of bilateral lacrimal glands: Secondary | ICD-10-CM | POA: Insufficient documentation

## 2016-01-13 DIAGNOSIS — R609 Edema, unspecified: Secondary | ICD-10-CM | POA: Insufficient documentation

## 2016-01-13 DIAGNOSIS — E559 Vitamin D deficiency, unspecified: Secondary | ICD-10-CM

## 2016-01-13 DIAGNOSIS — G5603 Carpal tunnel syndrome, bilateral upper limbs: Secondary | ICD-10-CM | POA: Insufficient documentation

## 2016-01-13 DIAGNOSIS — Z8679 Personal history of other diseases of the circulatory system: Secondary | ICD-10-CM | POA: Insufficient documentation

## 2016-01-13 DIAGNOSIS — R5383 Other fatigue: Secondary | ICD-10-CM | POA: Insufficient documentation

## 2016-01-13 DIAGNOSIS — M25569 Pain in unspecified knee: Secondary | ICD-10-CM

## 2016-01-13 DIAGNOSIS — H11123 Conjunctival concretions, bilateral: Secondary | ICD-10-CM

## 2016-01-13 DIAGNOSIS — R0902 Hypoxemia: Secondary | ICD-10-CM

## 2016-01-13 DIAGNOSIS — R400 Somnolence: Secondary | ICD-10-CM | POA: Insufficient documentation

## 2016-01-13 DIAGNOSIS — H9110 Presbycusis, unspecified ear: Secondary | ICD-10-CM

## 2016-01-13 DIAGNOSIS — E041 Nontoxic single thyroid nodule: Secondary | ICD-10-CM | POA: Insufficient documentation

## 2016-01-13 DIAGNOSIS — R0600 Dyspnea, unspecified: Secondary | ICD-10-CM | POA: Insufficient documentation

## 2016-01-13 DIAGNOSIS — J8410 Pulmonary fibrosis, unspecified: Secondary | ICD-10-CM

## 2016-01-13 DIAGNOSIS — M2550 Pain in unspecified joint: Secondary | ICD-10-CM | POA: Insufficient documentation

## 2016-01-13 DIAGNOSIS — K5790 Diverticulosis of intestine, part unspecified, without perforation or abscess without bleeding: Secondary | ICD-10-CM | POA: Insufficient documentation

## 2016-01-13 HISTORY — DX: Conjunctival concretions, bilateral: H11.123

## 2016-01-13 HISTORY — DX: Menopausal and female climacteric states: N95.1

## 2016-01-13 HISTORY — DX: Personal history of other diseases of the circulatory system: Z86.79

## 2016-01-13 HISTORY — DX: Other forms of dyspnea: R06.09

## 2016-01-13 HISTORY — DX: Unilateral inguinal hernia, without obstruction or gangrene, not specified as recurrent: K40.90

## 2016-01-13 HISTORY — DX: Age-related nuclear cataract, bilateral: H25.13

## 2016-01-13 HISTORY — DX: Presbycusis, unspecified ear: H91.10

## 2016-01-13 HISTORY — DX: Pulmonary fibrosis, unspecified: J84.10

## 2016-01-13 HISTORY — DX: Unspecified symptoms and signs involving cognitive functions and awareness: R41.9

## 2016-01-13 HISTORY — DX: Dyspnea, unspecified: R06.00

## 2016-01-13 HISTORY — DX: Type 2 diabetes mellitus without complications: E11.9

## 2016-01-13 HISTORY — DX: Vitamin D deficiency, unspecified: E55.9

## 2016-01-13 HISTORY — DX: Major depressive disorder, recurrent severe without psychotic features: F33.2

## 2016-01-13 HISTORY — DX: Carpal tunnel syndrome, bilateral upper limbs: G56.03

## 2016-01-13 HISTORY — DX: Other fatigue: R53.83

## 2016-01-13 HISTORY — DX: Somnolence: R40.0

## 2016-01-13 HISTORY — DX: Dry eye syndrome of bilateral lacrimal glands: H04.123

## 2016-01-13 HISTORY — DX: Hypoxemia: R09.02

## 2016-01-13 HISTORY — DX: Pain in unspecified knee: M25.569

## 2016-01-13 HISTORY — DX: Edema, unspecified: R60.9

## 2016-01-15 DIAGNOSIS — F418 Other specified anxiety disorders: Secondary | ICD-10-CM | POA: Insufficient documentation

## 2016-01-15 HISTORY — DX: Other specified anxiety disorders: F41.8

## 2016-01-20 ENCOUNTER — Encounter: Payer: Self-pay | Admitting: Critical Care Medicine

## 2016-04-06 DIAGNOSIS — R55 Syncope and collapse: Secondary | ICD-10-CM | POA: Insufficient documentation

## 2016-06-17 DIAGNOSIS — H1131 Conjunctival hemorrhage, right eye: Secondary | ICD-10-CM | POA: Insufficient documentation

## 2016-06-17 DIAGNOSIS — H524 Presbyopia: Secondary | ICD-10-CM

## 2016-06-17 DIAGNOSIS — H43391 Other vitreous opacities, right eye: Secondary | ICD-10-CM

## 2016-06-17 HISTORY — DX: Conjunctival hemorrhage, right eye: H11.31

## 2016-06-17 HISTORY — DX: Other vitreous opacities, right eye: H43.391

## 2016-06-17 HISTORY — DX: Presbyopia: H52.4

## 2016-07-01 DIAGNOSIS — T380X5A Adverse effect of glucocorticoids and synthetic analogues, initial encounter: Secondary | ICD-10-CM

## 2016-07-01 DIAGNOSIS — E099 Drug or chemical induced diabetes mellitus without complications: Secondary | ICD-10-CM

## 2016-07-01 HISTORY — DX: Drug or chemical induced diabetes mellitus without complications: E09.9

## 2016-07-27 DIAGNOSIS — R1310 Dysphagia, unspecified: Secondary | ICD-10-CM | POA: Insufficient documentation

## 2016-07-27 HISTORY — DX: Dysphagia, unspecified: R13.10

## 2016-07-30 DIAGNOSIS — R269 Unspecified abnormalities of gait and mobility: Secondary | ICD-10-CM

## 2016-07-30 DIAGNOSIS — M542 Cervicalgia: Secondary | ICD-10-CM | POA: Insufficient documentation

## 2016-07-30 DIAGNOSIS — M545 Low back pain, unspecified: Secondary | ICD-10-CM | POA: Insufficient documentation

## 2016-07-30 HISTORY — DX: Low back pain, unspecified: M54.50

## 2016-07-30 HISTORY — DX: Cervicalgia: M54.2

## 2016-07-30 HISTORY — DX: Unspecified abnormalities of gait and mobility: R26.9

## 2017-06-23 HISTORY — PX: COLONOSCOPY: SHX174

## 2017-07-06 ENCOUNTER — Telehealth: Payer: Self-pay | Admitting: *Deleted

## 2017-07-06 ENCOUNTER — Other Ambulatory Visit: Payer: Self-pay | Admitting: Obstetrics & Gynecology

## 2017-07-06 DIAGNOSIS — Z1231 Encounter for screening mammogram for malignant neoplasm of breast: Secondary | ICD-10-CM

## 2017-07-06 NOTE — Telephone Encounter (Signed)
Pt called requesting name of office to have mammogram, I gave pt name of the breast center of [place] to call and schedule. I also told pt to have [facility] to have previous films sent to breast center.

## 2017-07-09 ENCOUNTER — Ambulatory Visit
Admission: RE | Admit: 2017-07-09 | Discharge: 2017-07-09 | Disposition: A | Discharge status: Home / Self Care | Payer: BLUE CROSS/BLUE SHIELD | Source: Ambulatory Visit | Attending: Obstetrics & Gynecology | Admitting: Obstetrics & Gynecology

## 2017-07-09 DIAGNOSIS — Z1231 Encounter for screening mammogram for malignant neoplasm of breast: Secondary | ICD-10-CM

## 2017-07-23 ENCOUNTER — Encounter: Payer: Self-pay | Admitting: Obstetrics & Gynecology

## 2017-08-18 DIAGNOSIS — D849 Immunodeficiency, unspecified: Secondary | ICD-10-CM

## 2017-08-18 HISTORY — DX: Immunodeficiency, unspecified: D84.9

## 2017-08-24 ENCOUNTER — Encounter: Payer: Self-pay | Admitting: Obstetrics & Gynecology

## 2017-10-27 ENCOUNTER — Telehealth: Payer: Self-pay | Admitting: *Deleted

## 2017-10-27 MED ORDER — ESTRADIOL 0.075 MG/24HR TD PTWK: 0.0750 mg | MEDICATED_PATCH | TRANSDERMAL | 0 refills | Status: DC

## 2017-10-27 NOTE — Telephone Encounter (Signed)
Patient has annual scheduled on 11/22/17 needs refill on estradiol weekly patch 0.075 mg. Previous annual exam per paper chart was 05/20/16. Rx sent.

## 2017-11-02 DIAGNOSIS — R748 Abnormal levels of other serum enzymes: Secondary | ICD-10-CM | POA: Insufficient documentation

## 2017-11-02 HISTORY — DX: Abnormal levels of other serum enzymes: R74.8

## 2017-11-04 DIAGNOSIS — S239XXA Sprain of unspecified parts of thorax, initial encounter: Secondary | ICD-10-CM | POA: Insufficient documentation

## 2017-11-04 HISTORY — DX: Sprain of unspecified parts of thorax, initial encounter: S23.9XXA

## 2017-11-19 ENCOUNTER — Encounter: Payer: Self-pay | Admitting: Obstetrics & Gynecology

## 2017-11-22 ENCOUNTER — Other Ambulatory Visit: Payer: Self-pay

## 2017-11-22 ENCOUNTER — Encounter: Payer: BLUE CROSS/BLUE SHIELD | Admitting: Obstetrics & Gynecology

## 2017-12-27 ENCOUNTER — Other Ambulatory Visit: Payer: Self-pay

## 2017-12-27 NOTE — Telephone Encounter (Signed)
Per 10/27/17 note "Previous annual exam per paper chart was 05/20/16."  Patient has cancelled x 3 with appts 07/23/17, 08/24/17, 11/22/17. She is currently schedule for 02/09/18.

## 2018-01-03 NOTE — Telephone Encounter (Signed)
If cancels again, do not reschedule, will discharge from clinic.

## 2018-01-03 NOTE — Telephone Encounter (Signed)
This was supposed to have medication for refill linked to it. Sorry. That was why I was making you aware of her appointment history.

## 2018-01-05 MED ORDER — ESTRADIOL 0.075 MG/24HR TD PTWK: 0.0750 mg | MEDICATED_PATCH | TRANSDERMAL | 1 refills | Status: DC

## 2018-02-09 ENCOUNTER — Encounter: Payer: Self-pay | Admitting: Obstetrics & Gynecology

## 2018-02-09 ENCOUNTER — Ambulatory Visit: Payer: BLUE CROSS/BLUE SHIELD | Admitting: Obstetrics & Gynecology

## 2018-02-09 VITALS — BP 130/84 | Ht 65.0 in | Wt 199.6 lb

## 2018-02-09 DIAGNOSIS — Z90710 Acquired absence of both cervix and uterus: Secondary | ICD-10-CM

## 2018-02-09 DIAGNOSIS — Z79890 Hormone replacement therapy (postmenopausal): Secondary | ICD-10-CM | POA: Diagnosis not present

## 2018-02-09 DIAGNOSIS — Z01419 Encounter for gynecological examination (general) (routine) without abnormal findings: Secondary | ICD-10-CM | POA: Diagnosis not present

## 2018-02-09 DIAGNOSIS — N816 Rectocele: Secondary | ICD-10-CM

## 2018-02-09 DIAGNOSIS — Z01411 Encounter for gynecological examination (general) (routine) with abnormal findings: Secondary | ICD-10-CM

## 2018-02-09 MED ORDER — ESTRADIOL 0.075 MG/24HR TD PTWK: 0.0750 mg | MEDICATED_PATCH | TRANSDERMAL | 4 refills | Status: DC

## 2018-02-09 NOTE — Progress Notes (Signed)
Norma Lopez 1961-08-15 [id number]   History:    57 y.o. [id number] Married.    RP:  Established patient presenting for annual gyn exam   HPI: S/P Total Hysterectomy.  Menopause on Estradiol 0.075 patch.  Vasomotor menopausal symptoms well controled on that dosage.  No pelvic pain.  C/O Rectocele with bulging just at vulva when passing a BM or physically active.  H/O constipation, now treating successfully.  Had 3 SVD.  No SUI.  In the process of loosing weight, lost 40 Lbs x last year.  BMI now 33.22.  Breasts wnl.  Health labs with Family MD.  Past medical history,surgical history, family history and social history were all reviewed and documented in the EPIC chart.  Gynecologic History No LMP recorded. Patient has had a hysterectomy. Contraception: status post hysterectomy Last Pap: 04/2016. Results were: Negative, HPV HR neg Last mammogram: 06/2017. Results were: Negative Bone Density: Will schedule now Colonoscopy: 2015  Obstetric History OB History  Gravida Para Term Preterm AB Living  4 3     1 3   SAB TAB Ectopic Multiple Live Births               # Outcome Date GA Lbr Len/2nd Weight Sex Delivery Anes PTL Lv  4 AB           3 Para           2 Para           1 Para                ROS: A ROS was performed and pertinent positives and negatives are included in the history.  GENERAL: No fevers or chills. HEENT: No change in vision, no earache, sore throat or sinus congestion. NECK: No pain or stiffness. CARDIOVASCULAR: No chest pain or pressure. No palpitations. PULMONARY: No shortness of breath, cough or wheeze. GASTROINTESTINAL: No abdominal pain, nausea, vomiting or diarrhea, melena or bright red blood per rectum. GENITOURINARY: No urinary frequency, urgency, hesitancy or dysuria. MUSCULOSKELETAL: No joint or muscle pain, no back pain, no recent trauma. DERMATOLOGIC: No rash, no itching, no lesions. ENDOCRINE: No polyuria, polydipsia, no heat or cold intolerance. No recent  change in weight. HEMATOLOGICAL: No anemia or easy bruising or bleeding. NEUROLOGIC: No headache, seizures, numbness, tingling or weakness. PSYCHIATRIC: No depression, no loss of interest in normal activity or change in sleep pattern.     Exam:   BP 130/84   Ht 5\' 5"  (1.651 m)   Wt 199 lb 9.6 oz (90.5 kg)   BMI 33.22 kg/m   Body mass index is 33.22 kg/m.  General appearance : Well developed well nourished female. No acute distress HEENT: Eyes: no retinal hemorrhage or exudates,  Neck supple, trachea midline, no carotid bruits, no thyroidmegaly Lungs: Clear to auscultation, no rhonchi or wheezes, or rib retractions  Heart: Regular rate and rhythm, no murmurs or gallops Breast:Examined in sitting and supine position were symmetrical in appearance, no palpable masses or tenderness,  no skin retraction, no nipple inversion, no nipple discharge, no skin discoloration, no axillary or supraclavicular lymphadenopathy Abdomen: no palpable masses or tenderness, no rebound or guarding Extremities: no edema or skin discoloration or tenderness  Pelvic: Vulva: Normal             Vagina: No gross lesions or discharge.  High rectocele grade 3/4  Cervix/Uterus absent  Adnexa  Without masses or tenderness  Anus: Normal    Assessment/Plan:  57 y.o.  female for annual exam   1. Encounter for gynecological examination with abnormal finding Gynecologic exam status post hysterectomy.  Pap reflex done.  Breast exam normal.  Continue with weight loss with regular physical activity aerobic 5 times a week and weightlifting every 2 days and low calorie/low carb diet.  2. Hx of total hysterectomy  3. Post-menopause on HRT (hormone replacement therapy) Estradiol patch 0.075 weekly controlling her menopausal symptoms.  No contraindication to hormone replacement therapy.  Prescription resent to pharmacy.  4. 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  Patient will  continue with weight loss and Kegel exercises.  Will avoid pelvic/pressure.  Will follow up for reevaluation if desires surgical correction.  Other orders - estradiol (CLIMARA - DOSED IN MG/24 HR) 0.075 mg/24hr patch; Place 1 patch (0.075 mg total) onto the skin once a week.  Counseling on above issues more than 50% for 10 minutes.  Princess Bruins MD, 11:19 AM 02/09/2018

## 2018-02-13 ENCOUNTER — Encounter: Payer: Self-pay | Admitting: Obstetrics & Gynecology

## 2018-02-13 NOTE — Patient Instructions (Signed)
1. Encounter for gynecological examination with abnormal finding Gynecologic exam status post hysterectomy.  Pap reflex done.  Breast exam normal.  Continue with weight loss with regular physical activity aerobic 5 times a week and weightlifting every 2 days and low calorie/low carb diet.  2. Hx of total hysterectomy  3. Post-menopause on HRT (hormone replacement therapy) Estradiol patch 0.075 weekly controlling her menopausal symptoms.  No contraindication to hormone replacement therapy.  Prescription resent to pharmacy.  4. 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  Patient will continue with weight loss and Kegel exercises.  Will avoid pelvic/pressure.  Will follow up for reevaluation if desires surgical correction.  Other orders - estradiol (CLIMARA - DOSED IN MG/24 HR) 0.075 mg/24hr patch; Place 1 patch (0.075 mg total) onto the skin once a week.  Norma Lopez, it was a pleasure seeing you today!  I will inform you of your results as soon as they are available.  About Rectocele  Overview  A rectocele is a type of hernia which causes different degrees of bulging of the rectal tissues into the vaginal wall.  You may even notice that it presses against the vaginal wall so much that some vaginal tissues droop outside of the opening of your vagina.  Causes of Rectocele  The most common cause is childbirth.  The muscles and ligaments in the pelvis that hold up and support the female organs and vagina become stretched and weakened during labor and delivery.  The more babies you have, the more the support tissues are stretched and weakened.  Not everyone who has a baby will develop a rectocele.  Some women have stronger supporting tissue in the pelvis and may not have as much of a problem as others.  Women who have a Cesarean section usually do not get rectocele's unless they pushed a long time prior to the cesarean delivery.  Other conditions that can cause a  rectocele include chronic constipation, a chronic cough, a lot of heavy lifting, and obesity.  Older women may have this problem because the loss of female hormones causes the vaginal tissue to become weaker.  Symptoms  There may not be any symptoms.  If you do have symptoms, they may include:  Pelvic pressure in the rectal area  Protrusion of the lower part of the vagina through the opening of the vagina  Constipation and trapping of the stool, making it difficult to have a bowel movement.  In severe cases, you may have to press on the lower part of your vagina to help push the stool out of you rectum.  This is called splinting to empty.  Diagnosing Rectocele  Your health care provider will ask about your symptoms and perform a pelvic exam.  S/he will ask you to bear down, pushing like you are having a bowel movement so as to see how far the lower part of the vagina protrudes into the vagina and possible outside of the vagina.  Your provider will also ask you to contract the muscles of your pelvis (like you are stopping the stream in the middle of urinating) to determine the strength of your pelvic muscles.  Your provider may also do a rectal exam.  Treatment Options  If you do not have any symptoms, no treatment may be necessary.  Other treatment options include:  Pelvic floor exercises: Contracting the muscles in your genital area may help strengthen your muscles and support the organs.  Be sure to get proper  exercise instruction from you physical therapist.  A pessary (removealbe pelvic support device) sometimes helps rectocele symptoms.  Surgery: Surgical repair may be necessary. In some cases the uterus may need to be taken out ( a hysterectomy) as well.  There are many types of surgery for pelvic support problems.  Look for physicians who specialize in repair procedures.  You can take care of yourself by:  Treating and preventing constipation  Avoiding heavy lifting, and lifting  correctly (with your legs, not with you waist or back)  Treating a chronic cough or bronchitis  Not smoking  avoiding too much weight gain  Doing pelvic floor exercises   2007, [employer] Doc.33 Health Maintenance for Postmenopausal Women Menopause is a normal process in which your reproductive ability comes to an end. This process happens gradually over a span of months to years, usually between the ages of 84 and 49. Menopause is complete when you have missed 12 consecutive menstrual periods. It is important to talk with your health care provider about some of the most common conditions that affect postmenopausal women, such as heart disease, cancer, and bone loss (osteoporosis). Adopting a healthy lifestyle and getting preventive care can help to promote your health and wellness. Those actions can also lower your chances of developing some of these common conditions. What should I know about menopause? During menopause, you may experience a number of symptoms, such as:  Moderate-to-severe hot flashes.  Night sweats.  Decrease in sex drive.  Mood swings.  Headaches.  Tiredness.  Irritability.  Memory problems.  Insomnia.  Choosing to treat or not to treat menopausal changes is an individual decision that you make with your health care provider. What should I know about hormone replacement therapy and supplements? Hormone therapy products are effective for treating symptoms that are associated with menopause, such as hot flashes and night sweats. Hormone replacement carries certain risks, especially as you become older. If you are thinking about using estrogen or estrogen with progestin treatments, discuss the benefits and risks with your health care provider. What should I know about heart disease and stroke? Heart disease, heart attack, and stroke become more likely as you age. This may be due, in part, to the hormonal changes that your body experiences during  menopause. These can affect how your body processes dietary fats, triglycerides, and cholesterol. Heart attack and stroke are both medical emergencies. There are many things that you can do to help prevent heart disease and stroke:  Have your blood pressure checked at least every 1-2 years. High blood pressure causes heart disease and increases the risk of stroke.  If you are 11-35 years old, ask your health care provider if you should take aspirin to prevent a heart attack or a stroke.  Do not use any tobacco products, including cigarettes, chewing tobacco, or electronic cigarettes. If you need help quitting, ask your health care provider.  It is important to eat a healthy diet and maintain a healthy weight. ? Be sure to include plenty of vegetables, fruits, low-fat dairy products, and lean protein. ? Avoid eating foods that are high in solid fats, added sugars, or salt (sodium).  Get regular exercise. This is one of the most important things that you can do for your health. ? Try to exercise for at least 150 minutes each week. The type of exercise that you do should increase your heart rate and make you sweat. This is known as moderate-intensity exercise. ? Try to do strengthening exercises  at least twice each week. Do these in addition to the moderate-intensity exercise.  Know your numbers.Ask your health care provider to check your cholesterol and your blood glucose. Continue to have your blood tested as directed by your health care provider.  What should I know about cancer screening? There are several types of cancer. Take the following steps to reduce your risk and to catch any cancer development as early as possible. Breast Cancer  Practice breast self-awareness. ? This means understanding how your breasts normally appear and feel. ? It also means doing regular breast self-exams. Let your health care provider know about any changes, no matter how small.  If you are 36 or older,  have a clinician do a breast exam (clinical breast exam or CBE) every year. Depending on your age, family history, and medical history, it may be recommended that you also have a yearly breast X-ray (mammogram).  If you have a family history of breast cancer, talk with your health care provider about genetic screening.  If you are at high risk for breast cancer, talk with your health care provider about having an MRI and a mammogram every year.  Breast cancer (BRCA) gene test is recommended for women who have family members with BRCA-related cancers. Results of the assessment will determine the need for genetic counseling and BRCA1 and for BRCA2 testing. BRCA-related cancers include these types: ? Breast. This occurs in males or females. ? Ovarian. ? Tubal. This may also be called fallopian tube cancer. ? Cancer of the abdominal or pelvic lining (peritoneal cancer). ? Prostate. ? Pancreatic.  Cervical, Uterine, and Ovarian Cancer Your health care provider may recommend that you be screened regularly for cancer of the pelvic organs. These include your ovaries, uterus, and vagina. This screening involves a pelvic exam, which includes checking for microscopic changes to the surface of your cervix (Pap test).  For women ages 21-65, health care providers may recommend a pelvic exam and a Pap test every three years. For women ages 48-65, they may recommend the Pap test and pelvic exam, combined with testing for human papilloma virus (HPV), every five years. Some types of HPV increase your risk of cervical cancer. Testing for HPV may also be done on women of any age who have unclear Pap test results.  Other health care providers may not recommend any screening for nonpregnant women who are considered low risk for pelvic cancer and have no symptoms. Ask your health care provider if a screening pelvic exam is right for you.  If you have had past treatment for cervical cancer or a condition that could  lead to cancer, you need Pap tests and screening for cancer for at least 20 years after your treatment. If Pap tests have been discontinued for you, your risk factors (such as having a new sexual partner) need to be reassessed to determine if you should start having screenings again. Some women have medical problems that increase the chance of getting cervical cancer. In these cases, your health care provider may recommend that you have screening and Pap tests more often.  If you have a family history of uterine cancer or ovarian cancer, talk with your health care provider about genetic screening.  If you have vaginal bleeding after reaching menopause, tell your health care provider.  There are currently no reliable tests available to screen for ovarian cancer.  Lung Cancer Lung cancer screening is recommended for adults 1-34 years old who are at high risk for lung  cancer because of a history of smoking. A yearly low-dose CT scan of the lungs is recommended if you:  Currently smoke.  Have a history of at least 30 pack-years of smoking and you currently smoke or have quit within the past 15 years. A pack-year is smoking an average of one pack of cigarettes per day for one year.  Yearly screening should:  Continue until it has been 15 years since you quit.  Stop if you develop a health problem that would prevent you from having lung cancer treatment.  Colorectal Cancer  This type of cancer can be detected and can often be prevented.  Routine colorectal cancer screening usually begins at age 53 and continues through age 70.  If you have risk factors for colon cancer, your health care provider may recommend that you be screened at an earlier age.  If you have a family history of colorectal cancer, talk with your health care provider about genetic screening.  Your health care provider may also recommend using home test kits to check for hidden blood in your stool.  A small camera at the  end of a tube can be used to examine your colon directly (sigmoidoscopy or colonoscopy). This is done to check for the earliest forms of colorectal cancer.  Direct examination of the colon should be repeated every 5-10 years until age 27. However, if early forms of precancerous polyps or small growths are found or if you have a family history or genetic risk for colorectal cancer, you may need to be screened more often.  Skin Cancer  Check your skin from head to toe regularly.  Monitor any moles. Be sure to tell your health care provider: ? About any new moles or changes in moles, especially if there is a change in a mole's shape or color. ? If you have a mole that is larger than the size of a pencil eraser.  If any of your family members has a history of skin cancer, especially at a young age, talk with your health care provider about genetic screening.  Always use sunscreen. Apply sunscreen liberally and repeatedly throughout the day.  Whenever you are outside, protect yourself by wearing long sleeves, pants, a wide-brimmed hat, and sunglasses.  What should I know about osteoporosis? Osteoporosis is a condition in which bone destruction happens more quickly than new bone creation. After menopause, you may be at an increased risk for osteoporosis. To help prevent osteoporosis or the bone fractures that can happen because of osteoporosis, the following is recommended:  If you are 67-34 years old, get at least 1,000 mg of calcium and at least 600 mg of vitamin D per day.  If you are older than age 61 but younger than age 18, get at least 1,200 mg of calcium and at least 600 mg of vitamin D per day.  If you are older than age 20, get at least 1,200 mg of calcium and at least 800 mg of vitamin D per day.  Smoking and excessive alcohol intake increase the risk of osteoporosis. Eat foods that are rich in calcium and vitamin D, and do weight-bearing exercises several times each week as directed  by your health care provider. What should I know about how menopause affects my mental health? Depression may occur at any age, but it is more common as you become older. Common symptoms of depression include:  Low or sad mood.  Changes in sleep patterns.  Changes in appetite or eating patterns.  Feeling an overall lack of motivation or enjoyment of activities that you previously enjoyed.  Frequent crying spells.  Talk with your health care provider if you think that you are experiencing depression. What should I know about immunizations? It is important that you get and maintain your immunizations. These include:  Tetanus, diphtheria, and pertussis (Tdap) booster vaccine.  Influenza every year before the flu season begins.  Pneumonia vaccine.  Shingles vaccine.  Your health care provider may also recommend other immunizations. This information is not intended to replace advice given to you by your health care provider. Make sure you discuss any questions you have with your health care provider. Document Released: 02/05/2006 Document Revised: 07/03/2016 Document Reviewed: 09/17/2015 Elsevier Interactive Patient Education  2018 [employer].

## 2018-02-14 ENCOUNTER — Other Ambulatory Visit: Payer: Self-pay | Admitting: Gynecology

## 2018-02-14 DIAGNOSIS — Z13820 Encounter for screening for osteoporosis: Secondary | ICD-10-CM

## 2018-02-15 LAB — PAP IG W/ RFLX HPV ASCU

## 2018-02-16 ENCOUNTER — Encounter: Payer: Self-pay | Admitting: *Deleted

## 2018-03-10 DIAGNOSIS — G44219 Episodic tension-type headache, not intractable: Secondary | ICD-10-CM | POA: Insufficient documentation

## 2018-03-10 HISTORY — DX: Episodic tension-type headache, not intractable: G44.219

## 2018-03-24 ENCOUNTER — Telehealth: Payer: Self-pay

## 2018-03-24 ENCOUNTER — Encounter: Payer: Self-pay | Admitting: Gynecology

## 2018-03-24 ENCOUNTER — Ambulatory Visit (INDEPENDENT_AMBULATORY_CARE_PROVIDER_SITE_OTHER): Payer: BLUE CROSS/BLUE SHIELD

## 2018-03-24 DIAGNOSIS — Z13820 Encounter for screening for osteoporosis: Secondary | ICD-10-CM | POA: Diagnosis not present

## 2018-03-24 NOTE — Telephone Encounter (Signed)
Patient wants refills on her Ranitidine, how many would you like to give her?

## 2018-03-27 NOTE — Telephone Encounter (Signed)
Please give her 10 refills

## 2018-03-28 MED ORDER — RANITIDINE HCL 300 MG PO CAPS: 300.0000 mg | ORAL_CAPSULE | Freq: Every day | ORAL | 10 refills | Status: DC

## 2018-03-28 NOTE — Telephone Encounter (Signed)
Refills sent to pharmacy. 

## 2018-05-02 ENCOUNTER — Other Ambulatory Visit: Payer: Self-pay | Admitting: Obstetrics & Gynecology

## 2018-05-02 MED ORDER — ESTRADIOL 0.075 MG/24HR TD PTTW: MEDICATED_PATCH | TRANSDERMAL | 10 refills | Status: DC

## 2018-05-02 NOTE — Addendum Note (Signed)
Addended by: Ramond Craver on: 05/02/2018 04:42 PM   Modules accepted: Orders

## 2018-05-12 ENCOUNTER — Ambulatory Visit: Payer: BLUE CROSS/BLUE SHIELD | Admitting: Allergy and Immunology

## 2018-06-02 ENCOUNTER — Encounter: Payer: Self-pay | Admitting: Sports Medicine

## 2018-06-02 ENCOUNTER — Ambulatory Visit: Payer: BLUE CROSS/BLUE SHIELD | Admitting: Sports Medicine

## 2018-06-02 VITALS — BP 122/81 | HR 71 | Temp 97.8°F | Resp 16 | Ht 66.0 in | Wt 186.0 lb

## 2018-06-02 DIAGNOSIS — L601 Onycholysis: Secondary | ICD-10-CM

## 2018-06-02 DIAGNOSIS — M79674 Pain in right toe(s): Secondary | ICD-10-CM | POA: Diagnosis not present

## 2018-06-02 DIAGNOSIS — S90424A Blister (nonthermal), right lesser toe(s), initial encounter: Secondary | ICD-10-CM | POA: Diagnosis not present

## 2018-06-02 NOTE — Progress Notes (Signed)
Subjective: Norma Lopez is a 57 y.o. female patient presents to office today c for evaluation of right great toenail states that last week wore a pair of shoes that was a half size too small and experience redness swelling and inflammation at right great toe and also reports that her toenail started to loosen.  Patient states that she has done nothing besides keep it clean and that the nail has been free of pain, swelling, redness, drainage and has improved.  Patient does admit to a history of sarcoidosis and is on Remicade and states that on Tuesday her infusion was held until she had her toe checked to make sure it is not infected.  Patient denies any constitutional symptoms regarding her foot at this time denies warmth, redness, opening, drainage, or pain.  Review of Systems  Cardiovascular: Positive for orthopnea.  All other systems reviewed and are negative.   Patient Active Problem List   Diagnosis Date Noted  . OTHER PREMATURE BEATS 02/02/2011  . DIZZINESS 02/02/2011  . SLEEP APNEA 11/11/2008  . ALLERGIC RHINITIS 06/11/2008  . PNEUMOCONIOSIS 05/16/2008  . COUGH 04/26/2008  . HYPERLIPIDEMIA 04/20/2008  . PANIC DISORDER 04/20/2008  . ASTHMA 04/20/2008  . OTHER DISEASES OF LUNG NOT ELSEWHERE CLASSIFIED 04/20/2008  . GERD 04/20/2008    Current Outpatient Medications on File Prior to Visit  Medication Sig Dispense Refill  . albuterol (PROVENTIL HFA;VENTOLIN HFA) 108 (90 BASE) MCG/ACT inhaler Inhale 2 puffs into the lungs every 6 (six) hours as needed for wheezing or shortness of breath.     . busPIRone (BUSPAR) 15 MG tablet Take 15 mg by mouth 3 (three) times daily.    . carbamazepine (TEGRETOL XR) 100 MG 12 hr tablet Take 100 mg by mouth 2 (two) times daily.    . Cholecalciferol (VITAMIN D3) 2000 UNITS capsule Take 1,000 Units by mouth daily.     Marland Kitchen desvenlafaxine (PRISTIQ) 50 MG 24 hr tablet Take 25 mg by mouth daily.     Marland Kitchen dexlansoprazole (DEXILANT) 60 MG capsule Take 60 mg by  mouth daily.     Marland Kitchen estradiol (VIVELLE-DOT) 0.075 MG/24HR Place one patch on the skin once weekly. 4 patch 10  . Fluticasone Furoate 100 MCG/ACT AEPB Inhale into the lungs.    . folic acid (FOLVITE) 1 MG tablet Take 1 mg by mouth daily.    Marland Kitchen gabapentin (NEURONTIN) 600 MG tablet Take 600 mg by mouth 3 (three) times daily.    . InFLIXimab (REMICADE IV) Inject into the vein.    Marland Kitchen loratadine (CLARITIN) 10 MG tablet Take 10 mg by mouth daily as needed for allergies.     . Magnesium Oxide 500 MG TABS Take 500 mg by mouth at bedtime.     . metFORMIN (GLUCOPHAGE) 500 MG tablet Take by mouth 2 (two) times daily with a meal.    . methotrexate (RHEUMATREX) 15 MG tablet Take 15 mg by mouth once a week. Caution: Chemotherapy. Protect from light.    . ondansetron (ZOFRAN) 4 MG tablet Take 4 mg by mouth every 8 (eight) hours as needed for nausea or vomiting.    . ranitidine (ZANTAC) 300 MG capsule Take 1 capsule (300 mg total) by mouth at bedtime. 30 capsule 10   No current facility-administered medications on file prior to visit.     Allergies  Allergen Reactions  . Contrast Media [Iodinated Diagnostic Agents] Itching  . Latex Itching  . Oxycodone Hcl     REACTION: itching  . Azithromycin  Palpitations  . Shellfish Allergy Rash  . Symbicort [Budesonide-Formoterol Fumarate] Palpitations  . Wellbutrin [Bupropion] Palpitations    Objective:  Vitals:   06/02/18 1137  Weight: 186 lb (84.4 kg)  Height: 5\' 6"  (1.676 m)    General: Well developed, nourished, in no acute distress, alert and oriented x3   Dermatology: Skin is warm, dry and supple bilateral.  Right hallux nail is lifting with a blister noted subungual with no surrounding warmth swelling drainage opening or extensive erythema or cellulitis.  No signs of infection to right hallux nail.  The remaining nails appear unremarkable at this time.   Vascular: Dorsalis Pedis artery and Posterior Tibial artery pedal pulses are 2/4 bilateral with  immedate capillary fill time. Pedal hair growth present. No lower extremity edema.   Neruologic: Grossly intact via light touch bilateral.  Musculoskeletal: No tenderness to palpation to right hallux nail. Muscular strength within normal limits in all groups bilateral.   Assesement and Plan: Problem List Items Addressed This Visit    None    Visit Diagnoses    Blister of toe of right foot, initial encounter    -  Primary   Improving with no signs of infection   Onycholysis of toenail       Toe pain, right          -Patient seen and evaluated -Discussed with patient that likely from wearing shoes that were too small this caused a blister to happen underneath her nail bed which is not infected and that appears to be healing -Due to patient immunocompromise state and history of sarcoidosis on Remicade recommended no nail procedure at this time and to allow her body to naturally healed the blister that is at her right great toe.  Advised patient that with time her toenail may slowly lift and fall off due to the blister being underneath her nail bed.  I advised patient that she can resume her Remicade since there is no signs of infection at right great toe there is no wound or opening and blister seems like it is healing without any signs of infection. -Recommend Epson salt soaks to continue to allow blister to finish off healing and may use topical antibiotic cream to prevent against infection at right great toenail as needed -Patient is to return as needed or sooner if problems arise.  Landis Martins, DPM

## 2018-06-13 ENCOUNTER — Ambulatory Visit: Payer: BLUE CROSS/BLUE SHIELD | Admitting: Allergy and Immunology

## 2018-07-05 ENCOUNTER — Ambulatory Visit: Payer: BLUE CROSS/BLUE SHIELD | Admitting: Allergy

## 2018-07-05 ENCOUNTER — Encounter: Payer: Self-pay | Admitting: Allergy

## 2018-07-05 VITALS — BP 100/62 | HR 88 | Temp 98.6°F | Resp 16 | Ht 64.4 in | Wt 184.8 lb

## 2018-07-05 DIAGNOSIS — T781XXA Other adverse food reactions, not elsewhere classified, initial encounter: Secondary | ICD-10-CM | POA: Diagnosis not present

## 2018-07-05 DIAGNOSIS — J309 Allergic rhinitis, unspecified: Secondary | ICD-10-CM

## 2018-07-05 DIAGNOSIS — H1010 Acute atopic conjunctivitis, unspecified eye: Secondary | ICD-10-CM

## 2018-07-05 DIAGNOSIS — T7840XA Allergy, unspecified, initial encounter: Secondary | ICD-10-CM | POA: Diagnosis not present

## 2018-07-05 DIAGNOSIS — J4520 Mild intermittent asthma, uncomplicated: Secondary | ICD-10-CM

## 2018-07-05 DIAGNOSIS — Z91040 Latex allergy status: Secondary | ICD-10-CM

## 2018-07-05 MED ORDER — AUVI-Q 0.3 MG/0.3ML IJ SOAJ: INTRAMUSCULAR | 1 refills | Status: DC

## 2018-07-05 NOTE — Progress Notes (Signed)
New Patient Note  RE: Norma Lopez MRN: [id number] DOB: March 18, 1961 Date of Office Visit: 07/05/2018  Referring provider: Verdell Carmine., MD Primary care provider: Verdell Carmine., MD  Chief Complaint: Reaction to shrimp  History of present illness: Norma Lopez is a 57 y.o. female presenting today for consultation for possible food allergy.  Around the end of March/first of April 2019 she states she had her first reaction to shrimp.  She was eating shrimp cocktail.  Shortly after ingestion she states she developed abdominal pain and her mouth started to tingled.  She tried shrimp cocktail again a week later and had increased mucus production, mouth itch, and an itchy rash on chest.  She tried shrimp again for a third time this time it was crab cakes with shrimp and quickly developed abdominal pain, eye swelling, itchy rash on chest and arms, increased mucus production with cough and wheeze.  She states she took benadryl and ranitidine and also used her albuterol inhaler and within 45 minutes she was feeling better.   She states she use to love shrimp and was eating it on average once a week.   She does not have an epinephrine device at this time.  Kiwi makes her mouth tingle and itch and has been avoiding for the last 4-5 years.    She reports itchy/watery eyes, runny nose and congestion, sneezing.  Spring and fall.   She takes claritin daily and also uses flonase.  She was on singulair but stopped this medication as she was trying to pare down the amount of medication she was taking.   She recalls about 15 years ago more she had allergy testing recalls being positive to pollens and molds. She does have dry eye and early onset cataracts and is followed with an ophthalmologist who has prescribed her dry eyedrops.  She has adult onset asthma (she thinks had asthma as a child that was never diagnosed).  She is on arnuity 1 puff daily.   She reports exercise induced wheezing and will use  albuterol prior to activity.  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   She states prior to her diagnosis of sarcoidosis she was having a lot of respiratory issues and was on a lot of systemic steroid before getting well controlled with her sarcoidosis.  She states she is allergic to latex after having a oral reaction to dentist who had used latex gloves.  Reports mouth started "peeling on the inside" and had increased saliva.   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    She does have a history of eczema she believes.   She does avoid nickel due to rash development with wearing watches.     Review of systems: Review of Systems  Constitutional: Positive for weight loss (Intentional). Negative for chills, fever and malaise/fatigue.  HENT: Negative for congestion, ear discharge, nosebleeds and sore throat.   Eyes: Negative for pain, discharge and redness.  Respiratory: Negative for cough, shortness of breath and wheezing.   Cardiovascular: Negative for chest pain.  Gastrointestinal: Negative for abdominal pain, constipation, diarrhea, heartburn, nausea and vomiting.  Musculoskeletal: Negative for joint pain.  Skin: Negative for itching and rash.  Neurological: Negative for headaches.    All other systems negative unless noted above in HPI  Past medical history: Past Medical History:  Diagnosis Date  .  ADD (attention deficit disorder with hyperactivity)   . Allergic rhinitis   . Allergy   . Asthma   . Diabetes ([place])   . Fibrosis of lung ([place])   . GERD (gastroesophageal reflux disease)   . GERD (gastroesophageal reflux disease)   . Hyperlipidemia   . Multiple lung nodules on CT   . Panic disorder   . PTSD (post-traumatic stress disorder)   . Sleep apnea   . Sleep apnea    patient reports using CPAP machine at night  . Thyroid nodule      Past surgical history: Past Surgical History:  Procedure Laterality Date  . BREAST BIOPSY  1985, 2006   Benign  . CHOLECYSTECTOMY    . KNEE ARTHROSCOPY     x3  . TONSILLECTOMY    . TOTAL ABDOMINAL HYSTERECTOMY  1992    Family history:  Family History  Problem Relation Age of Onset  . COPD Mother   . Arthritis Mother   . Depression Mother   . Anxiety disorder Mother   . COPD Father   . Alcohol abuse Father   . Heart disease Father   . Diabetes Father   . Cancer Father        lung  . Heart attack Father   . Asthma Sister   . Cancer Sister        colon  . Asthma Sister   . Diabetes Sister   . Breast cancer Sister   . Sleep apnea Sister   . Hyperlipidemia Other   . Allergies Other   . Obesity Other   . Anxiety disorder Other   . Arthritis Other     Social history:  Tobacco Use  . Smoking status: Former Smoker    Years: 6.00    Last attempt to quit: 12/28/1996    Years since quitting: 21.5  . Smokeless tobacco: Never Used  . Tobacco comment: Exposed to second hand smoke  Social History Narrative   Married with 3 children.  Lives in a home with out carpeting with gas heating and central cooling.  No pets in the home.  Cat outside the home.  No concern for water damage or mildew or roaches in the home.  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    Medication List: Allergies as of 07/05/2018      Reactions   Latex Itching, Other (See Comments), Rash   Blisters Blisters   Contrast Media [iodinated Diagnostic Agents] Itching   Tape Rash   redness redness redness redness redness   Oxycodone Hcl    REACTION: itching   Azithromycin Palpitations   Shellfish Allergy Rash   Symbicort [budesonide-formoterol Fumarate] Palpitations   Wellbutrin [bupropion] Palpitations      Medication List        Accurate as of 07/05/18  1:38 PM. Always use your most recent med list.          acetaminophen 325 MG tablet Commonly known as:   TYLENOL Take by mouth.   albuterol 108 (90 Base) MCG/ACT inhaler Commonly known as:  PROVENTIL HFA;VENTOLIN HFA Inhale 2 puffs into the lungs every 6 (six) hours as needed for wheezing or shortness of breath.   AUVI-Q 0.3 mg/0.3 mL Soaj injection Generic drug:  EPINEPHrine Use as directed for life-threatening allergic reaction.   busPIRone 7.5 MG tablet Commonly known as:  BUSPAR Take 7.5 mg by mouth 2 (two) times daily.   dexlansoprazole 60 MG capsule  Commonly known as:  DEXILANT Take 60 mg by mouth daily.   DOCOSAHEXAENOIC ACID PO Take 1 g by mouth.   EQUETRO 300 MG Cp12 Generic drug:  Carbamazepine Take 1 capsule by mouth at bedtime.   estradiol 0.075 MG/24HR Commonly known as:  VIVELLE-DOT Place one patch on the skin once weekly.   Fluticasone Furoate 100 MCG/ACT Aepb Inhale 1 puff into the lungs daily.   folic acid 1 MG tablet Commonly known as:  FOLVITE Take 1 mg by mouth daily.   gabapentin 600 MG tablet Commonly known as:  NEURONTIN Take 600 mg by mouth 3 (three) times daily.   loratadine 10 MG tablet Commonly known as:  CLARITIN Take 10 mg by mouth daily as needed for allergies.   Magnesium Oxide 500 MG Tabs Take 500 mg by mouth at bedtime.   metFORMIN 500 MG tablet Commonly known as:  GLUCOPHAGE Take by mouth 2 (two) times daily with a meal.   methotrexate 50 MG/2ML injection INJECT 0.3 ML UNDER THE SKIN EVERY 7 DAYS.   methylphenidate 10 MG tablet Commonly known as:  RITALIN Take 10 mg by mouth daily.   ondansetron 4 MG tablet Commonly known as:  ZOFRAN Take 4 mg by mouth every 8 (eight) hours as needed for nausea or vomiting.   OXYGEN 2 L.   PRISTIQ 50 MG 24 hr tablet Generic drug:  desvenlafaxine Take 25 mg by mouth daily.   desvenlafaxine 100 MG 24 hr tablet Commonly known as:  PRISTIQ Take 100 mg by mouth daily.   ranitidine 300 MG capsule Commonly known as:  ZANTAC Take 1 capsule (300 mg total) by mouth at bedtime.     REMICADE IV Inject into the vein.   Vitamin D3 2000 units capsule Take 1,000 Units by mouth daily.       Known medication allergies: Allergies  Allergen Reactions  . Latex Itching, Other (See Comments) and Rash    Blisters Blisters   . Contrast Media [Iodinated Diagnostic Agents] Itching  . Tape Rash    redness redness redness redness redness   . Oxycodone Hcl     REACTION: itching  . Azithromycin Palpitations  . Shellfish Allergy Rash  . Symbicort [Budesonide-Formoterol Fumarate] Palpitations  . Wellbutrin [Bupropion] Palpitations     Physical examination: Blood pressure 100/62, pulse 88, temperature 98.6 F (37 C), temperature source Oral, resp. rate 16, height 5' 4.4" (1.636 m), weight 184 lb 12.8 oz (83.8 kg).  General: Alert, interactive, in no acute distress. HEENT: PERRLA, TMs pearly gray, turbinates minimally edematous without discharge, post-pharynx non erythematous. Neck: Supple without lymphadenopathy. Lungs: Clear to auscultation without wheezing, rhonchi or rales. {no increased work of breathing. CV: Normal S1, S2 without murmurs. Abdomen: Nondistended, nontender. Skin: Warm and dry, without lesions or rashes. Extremities:  No clubbing, cyanosis or edema. Neuro:   Grossly intact.  Diagnositics/Labs:  Allergy testing: Environmental allergy skin prick testing is positive to weeds, trees, dust mite, dog and horse epithelia. Select food allergy skin prick testing to soy and shellfish panel is positive only to shrimp and oyster Allergy testing results were read and interpreted by provider, documented by clinical staff.   Assessment and plan:   Food allergy/allergic reaction    - history and testing is consistent with allergy to shrimp.   Testing to shellfish panel is positive to shrimp and oyster.    Soy, Crab, lobster and scallops are negative.  Will obtain shellfish panel IgE as well as tryptase level to determine if  you can possible eat  crab/lobster with caution to avoid cross-contamination.  She would like to still be able to eat crab and lobster if possible.  I discussed that the largest concern is with cross-contamination especially if she is not making the food items herself.  If she has negative serum IgE levels to crab and lobster as well as having negative skin prick testing and it is likely that she is not allergic to these crustaceans and should be able to tolerate them without any concern as long as there is no misidentification or cross-contamination of these foods with shrimp oyster.    - continue avoidance of shellfish at this time.      - have access to self-injectable epinephrine (Epipen or AuviQ) 0.3mg  at all times    - follow emergency action plan in case of allergic reaction    - it is likely that diarrhea with soy ingestion is either due to quantity vs intolerance.     Allergic rhinoconjunctivitis   - environmental allergy testing is positive to weeds, trees, dust mites, dog and horse.  Allergen avoidance measures provided   - can continue Claritin 10mg  daily   - use Flonase 1-2 sprays each nostril daily as needed for nasal congestion. Use for 1-2 weeks at a time before stopping once nasal symptoms improve   - continue your eyedrops as directed by your ophthalmologist  Pollen food allergy syndrome    - you are pollen allergic which is likely leading to oral symptoms with ingestion of kiwi.   Certain fruits/vegetables/nuts can appear similar to the body upon ingestion as pollens and can lead to oral cavity symptoms like mouth/lip/tongue itch and even swelling of the lips or tongue.   Recommend avoidance of food to avoid oral cavity symptoms.  It is rare that symptoms upon ingestion lead to anaphylaxis.    Asthma   - continue Arnuity 1 puff daily   - have access to albuterol inhaler 2 puffs every 4-6 hours as needed for cough/wheeze/shortness of breath/chest tightness.  May use 15-20 minutes prior to activity.    Monitor frequency of use.    Latex allergy    - continue avoidance of all latex based products  Follow-up 6 months or sooner if needed  I appreciate the opportunity to take part in Norma Lopez's care. Please do not hesitate to contact me with questions.  Sincerely,   Prudy Feeler, MD Allergy/Immunology [facility]

## 2018-07-05 NOTE — Patient Instructions (Addendum)
Food allergy     - history and testing is consistent with allergy to shrimp.   Testing to shellfish panel is positive to shrimp and oyster.    Soy, Crab, lobster and scallops are negative.  Will obtain shellfish panel IgE to determine if you can possible eat crab/lobster with caution to avoid cross-contamination.      - continue avoidance of shellfish at this time.      - have access to self-injectable epinephrine (Epipen or AuviQ) 0.3mg  at all times    - follow emergency action plan in case of allergic reaction    - it is likely that diarrhea with soy ingestion is either due to quantity vs intolerance.     Allergic rhinoconjunctivitis   - environmental allergy testing is positive to weeds, trees, dust mites, dog and horse.  Allergen avoidance measures provided   - can continue Claritin 10mg  daily   - use Flonase 1-2 sprays each nostril daily as needed for nasal congestion. Use for 1-2 weeks at a time before stopping once nasal symptoms improve   - continue your eyedrops as directed by your ophthalmologist  Pollen food allergy syndrome    - you are pollen allergic which is likely leading to oral symptoms with ingestion of kiwi.   Certain fruits/vegetables/nuts can appear similar to the body upon ingestion as pollens and can lead to oral cavity symptoms like mouth/lip/tongue itch and even swelling of the lips or tongue.   Recommend avoidance of food to avoid oral cavity symptoms.  It is rare that symptoms upon ingestion lead to anaphylaxis.    Asthma   - continue Arnuity 1 puff daily   - have access to albuterol inhaler 2 puffs every 4-6 hours as needed for cough/wheeze/shortness of breath/chest tightness.  May use 15-20 minutes prior to activity.   Monitor frequency of use.    Latex allergy    - continue avoidance of all latex based products  Follow-up 6 months or sooner if needed

## 2018-07-08 LAB — ALLERGEN PROFILE, SHELLFISH
Clam IgE: <0.1 kU/L
F023-IgE Crab: <0.1 kU/L
F080-IgE Lobster: <0.1 kU/L
F290-IgE Oyster: <0.1 kU/L
Scallop IgE: <0.1 kU/L
Shrimp IgE: <0.1 kU/L

## 2018-07-08 LAB — TRYPTASE: Tryptase: 7.6 ug/L (ref 2.2–13.2)

## 2018-08-26 ENCOUNTER — Ambulatory Visit: Payer: BLUE CROSS/BLUE SHIELD | Admitting: Cardiology

## 2018-08-26 ENCOUNTER — Encounter: Payer: Self-pay | Admitting: Cardiology

## 2018-08-26 VITALS — BP 124/84 | HR 73 | Ht 66.0 in | Wt 186.0 lb

## 2018-08-26 DIAGNOSIS — R079 Chest pain, unspecified: Secondary | ICD-10-CM

## 2018-08-26 DIAGNOSIS — R0609 Other forms of dyspnea: Secondary | ICD-10-CM

## 2018-08-26 DIAGNOSIS — R072 Precordial pain: Secondary | ICD-10-CM | POA: Insufficient documentation

## 2018-08-26 DIAGNOSIS — R002 Palpitations: Secondary | ICD-10-CM

## 2018-08-26 DIAGNOSIS — D860 Sarcoidosis of lung: Secondary | ICD-10-CM

## 2018-08-26 DIAGNOSIS — R0600 Dyspnea, unspecified: Secondary | ICD-10-CM

## 2018-08-26 HISTORY — DX: Precordial pain: R07.2

## 2018-08-26 HISTORY — DX: Palpitations: R00.2

## 2018-08-26 MED ORDER — ASPIRIN EC 81 MG PO TBEC: 81.0000 mg | DELAYED_RELEASE_TABLET | Freq: Every day | ORAL | 3 refills | Status: DC

## 2018-08-26 NOTE — Progress Notes (Signed)
Cardiology Consultation:    Date:  08/26/2018   ID:  Norma Lopez, DOB 10-27-1961, MRN [id number]  PCP:  Verdell Carmine., MD  Cardiologist:  Jenne Campus, MD   Referring MD: Verdell Carmine., MD   Chief Complaint  Patient presents with  . Follow-up    to discuss chest pain  I have chest pain  History of Present Illness:    Norma Lopez is a 57 y.o. female who is being seen today for the evaluation of chest pain at the request of Spry, Marsh Dolly., MD.  With history of sarcoidosis she did see my partner many years ago for palpitations quite extensive work-up has been done at that time which was negative.  This time she comes to me with symptoms of chest pain pain happens typically when she is gets very upset she described pain as sharp almost stabbing-like sensation lasting for up to a minute however she also described a situation where she was walking in the amount and developed a sensation to the point that she had to slow down and stop.  There is some shortness of breath associated with this sensation no sweating.  Still describe to have some palpitations she feels some skipped beats but no tachycardia.  She does have sarcoidosis which seems to be under control she is being followed by pulmonary team She does not exercise on the regular basis she reports a lot of stress at home she does have history of panic disorder anxiety and she is seeing psychiatrist as well as psychologist for it.  Past Medical History:  Diagnosis Date  . ADD (attention deficit disorder with hyperactivity)   . Allergic rhinitis   . Allergy   . Asthma   . Diabetes ([place])   . Fibrosis of lung ([place])   . GERD (gastroesophageal reflux disease)   . GERD (gastroesophageal reflux disease)   . Hyperlipidemia   . Multiple lung nodules on CT   . Panic disorder   . PTSD (post-traumatic stress disorder)   . Sleep apnea   . Sleep apnea    patient reports using CPAP machine at night  . Thyroid nodule     Past  Surgical History:  Procedure Laterality Date  . BREAST BIOPSY  1985, 2006   Benign  . CHOLECYSTECTOMY    . KNEE ARTHROSCOPY     x3  . TONSILLECTOMY    . TOTAL ABDOMINAL HYSTERECTOMY  1992    Current Medications: Current Meds  Medication Sig  . acetaminophen (TYLENOL) 325 MG tablet Take 325 mg by mouth every 6 (six) hours as needed for mild pain.   Marland Kitchen albuterol (PROVENTIL HFA;VENTOLIN HFA) 108 (90 BASE) MCG/ACT inhaler Inhale 2 puffs into the lungs every 6 (six) hours as needed for wheezing or shortness of breath.   Marland Kitchen AUVI-Q 0.3 MG/0.3ML SOAJ injection Use as directed for life-threatening allergic reaction.  . Cholecalciferol (VITAMIN D3) 2000 UNITS capsule Take 1,000 Units by mouth daily.   Marland Kitchen desvenlafaxine (PRISTIQ) 100 MG 24 hr tablet Take 100 mg by mouth daily.  Marland Kitchen dexlansoprazole (DEXILANT) 60 MG capsule Take 60 mg by mouth daily.   . DOCOSAHEXAENOIC ACID PO Take 1 g by mouth daily.   Marland Kitchen doxycycline (VIBRA-TABS) 100 MG tablet Take 100 mg by mouth daily.  Marland Kitchen EQUETRO 300 MG CP12 Take 1 capsule by mouth at bedtime.  Marland Kitchen estradiol (VIVELLE-DOT) 0.075 MG/24HR Place one patch on the skin once weekly.  . Fluticasone Furoate 100 MCG/ACT AEPB Inhale  1 puff into the lungs daily.   . folic acid (FOLVITE) 1 MG tablet Take 1 mg by mouth daily.  Marland Kitchen gabapentin (NEURONTIN) 100 MG capsule Take 600 mg by mouth daily.   . InFLIXimab (REMICADE IV) Inject into the vein.  Marland Kitchen loratadine (CLARITIN) 10 MG tablet Take 10 mg by mouth daily as needed for allergies.   . Magnesium Oxide 500 MG TABS Take 500 mg by mouth at bedtime.   . metFORMIN (GLUCOPHAGE) 500 MG tablet Take by mouth 2 (two) times daily with a meal.  . methotrexate 50 MG/2ML injection INJECT 0.3 ML UNDER THE SKIN EVERY 7 DAYS.  Marland Kitchen ondansetron (ZOFRAN) 4 MG tablet Take 4 mg by mouth every 8 (eight) hours as needed for nausea or vomiting.  . OXYGEN Inhale 2 L into the lungs at bedtime.   . ranitidine (ZANTAC) 150 MG tablet Take 150 mg by mouth at  bedtime.  . rosuvastatin (CRESTOR) 10 MG tablet Take 10 mg by mouth once a week.     Allergies:   Latex; Contrast media [iodinated diagnostic agents]; Tape; Oxycodone hcl; Azithromycin; Shellfish allergy; Symbicort [budesonide-formoterol fumarate]; and Wellbutrin [bupropion]   Social History   Socioeconomic History  . Marital status: Married    Spouse name: Not on file  . Number of children: Not on file  . Years of education: Not on file  . Highest education level: Not on file  Occupational History    Employer: [employer]  . Occupation: Therapist, sports  Social Needs  . Financial resource strain: Not on file  . Food insecurity:    Worry: Not on file    Inability: Not on file  . Transportation needs:    Medical: Not on file    Non-medical: Not on file  Tobacco Use  . Smoking status: Former Smoker    Years: 6.00    Last attempt to quit: 12/28/1996    Years since quitting: 21.6  . Smokeless tobacco: Never Used  . Tobacco comment: Exposed to second hand smoke  Substance and Sexual Activity  . Alcohol use: No  . Drug use: No  . Sexual activity: Yes    Partners: Male    Comment: 1st intercourse- 30, partners- 43, married- 34 yrs   Lifestyle  . Physical activity:    Days per week: Not on file    Minutes per session: Not on file  . Stress: Not on file  Relationships  . Social connections:    Talks on phone: Not on file    Gets together: Not on file    Attends religious service: Not on file    Active member of club or organization: Not on file    Attends meetings of clubs or organizations: Not on file    Relationship status: Not on file  Other Topics Concern  . Not on file  Social History Narrative   Married with 3 children   3-4 cups caffeine per day   Exercises 2-3 times per week     Family History: The patient's family history includes Alcohol abuse in her father; Allergies in her other; Anxiety disorder in her mother and other; Arthritis in her mother and other; Asthma in her sister  and sister; Breast cancer in her sister; COPD in her father and mother; Cancer in her father and sister; Depression in her mother; Diabetes in her father and sister; Heart attack in her father; Heart disease in her father; Hyperlipidemia in her other; Obesity in her other; Sleep apnea in her  sister. ROS:   Please see the history of present illness.    All 14 point review of systems negative except as described per history of present illness.  EKGs/Labs/Other Studies Reviewed:    The following studies were reviewed today:    Recent Labs: No results found for requested labs within last 8760 hours.  Recent Lipid Panel No results found for: CHOL, TRIG, HDL, CHOLHDL, VLDL, LDLCALC, LDLDIRECT  Physical Exam:    VS:  BP 124/84 (BP Location: Left Arm, Patient Position: Sitting, Cuff Size: Normal)   Pulse 73   Ht 5\' 6"  (1.676 m)   Wt 186 lb (84.4 kg)   SpO2 98%   BMI 30.02 kg/m     Wt Readings from Last 3 Encounters:  08/26/18 186 lb (84.4 kg)  07/05/18 184 lb 12.8 oz (83.8 kg)  06/02/18 186 lb (84.4 kg)     GEN:  Well nourished, well developed in no acute distress HEENT: Normal NECK: No JVD; No carotid bruits LYMPHATICS: No lymphadenopathy CARDIAC: RRR, no murmurs, no rubs, no gallops RESPIRATORY:  Clear to auscultation without rales, wheezing or rhonchi  ABDOMEN: Soft, non-tender, non-distended MUSCULOSKELETAL:  No edema; No deformity  SKIN: Warm and dry NEUROLOGIC:  Alert and oriented x 3 PSYCHIATRIC:  Normal affect   ASSESSMENT:    1. Chest pain, unspecified type   2. Palpitations   3. Precordial chest pain   4. Dyspnea on exertion   5. Sarcoidosis of lung ([place])    PLAN:    In order of problems listed above:  1. Precordial chest pain.  Some suspicious features therefore I think it would be appropriate to proceed with stress testing.  She will be scheduled to have stress echocardiogram.  EKG will be done as well.  I will not initiate any medications I advised her to  start taking one baby aspirin every single day until we have a stress test. 2. Palpitations: She will wear long-term monitor to see if we can identify exactly what arrhythmia if any she is experiencing. 3. Sarcoidosis of her lungs to be followed by internal medicine team. 4. Dyspnea on exertion multifactorial undoubtedly related to sarcoidosis however I need to look at her heart make sure there is no significant heart issues that could be responsible for her symptoms   Medication Adjustments/Labs and Tests Ordered: Current medicines are reviewed at length with the patient today.  Concerns regarding medicines are outlined above.  Orders Placed This Encounter  Procedures  . LONG TERM MONITOR (3-14 DAYS)  . EKG 12-Lead  . ECHOCARDIOGRAM STRESS TEST   Meds ordered this encounter  Medications  . aspirin EC 81 MG tablet    Sig: Take 1 tablet (81 mg total) by mouth daily.    Dispense:  90 tablet    Refill:  3    Signed, Park Liter, MD, [facility]. 08/26/2018 12:14 PM    [facility]

## 2018-08-26 NOTE — Patient Instructions (Addendum)
Medication Instructions:  Your physician has recommended you make the following change in your medication:   START aspirin 81 mg daily     Labwork: None  Testing/Procedures: You had an EKG today.   Your physician has requested that you have a stress echocardiogram. For further information please visit [email or web address]. Please follow instruction sheet as given.  Your physician has recommended that you wear a holter monitor. Holter monitors are medical devices that record the heart's electrical activity. Doctors most often use these monitors to diagnose arrhythmias. Arrhythmias are problems with the speed or rhythm of the heartbeat. The monitor is a small, portable device. You can wear one while you do your normal daily activities. This is usually used to diagnose what is causing palpitations/syncope (passing out). Wear for 7 days.   Follow-Up: Your physician recommends that you schedule a follow-up appointment in: 1 month.  If you need a refill on your cardiac medications before your next appointment, please call your pharmacy.   Thank you for choosing [facility]! Robyne Peers, RN [phone]    Exercise Stress Electrocardiogram An exercise stress electrocardiogram is a test to check how blood flows to your heart. It is done to find areas of poor blood flow. You will need to walk on a treadmill for this test. The electrocardiogram will record your heartbeat when you are at rest and when you are exercising. What happens before the procedure?  Do not have drinks with caffeine or foods with caffeine for 24 hours before the test, or as told by your doctor. This includes coffee, tea (even decaf tea), sodas, chocolate, and cocoa.  Follow your doctor's instructions about eating and drinking before the test.  Ask your doctor what medicines you should or should not take before the test. Take your medicines with water unless told by your doctor not to.  If you use an inhaler,  bring it with you to the test.  Bring a snack to eat after the test.  Do not  smoke for 4 hours before the test.  Do not put lotions, powders, creams, or oils on your chest before the test.  Wear comfortable shoes and clothing. What happens during the procedure?  You will have patches put on your chest. Small areas of your chest may need to be shaved. Wires will be connected to the patches.  Your heart rate will be watched while you are resting and while you are exercising.  You will walk on the treadmill. The treadmill will slowly get faster to raise your heart rate.  The test will take about 1-2 hours. What happens after the procedure?  Your heart rate and blood pressure will be watched after the test.  You may return to your normal diet, activities, and medicines or as told by your doctor. This information is not intended to replace advice given to you by your health care provider. Make sure you discuss any questions you have with your health care provider. Document Released: 06/01/2008 Document Revised: 08/12/2016 Document Reviewed: 08/21/2013 Elsevier Interactive Patient Education  2018 [place].     Holter Monitoring A Holter monitor is a small device that is used to detect abnormal heart rhythms. It clips to your clothing and is connected by wires to flat, sticky disks (electrodes) that attach to your chest. It is worn continuously for 24-48 hours. Follow these instructions at home:  Wear your Holter monitor at all times, even while exercising and sleeping, for as long as directed by your health care  provider.  Make sure that the Holter monitor is safely clipped to your clothing or close to your body as recommended by your health care provider.  Do not get the monitor or wires wet.  Do not put body lotion or moisturizer on your chest.  Keep your skin clean.  Keep a diary of your daily activities, such as walking and doing chores. If you feel that your heartbeat  is abnormal or that your heart is fluttering or skipping a beat: ? Record what you are doing when it happens. ? Record what time of day the symptoms occur.  Return your Holter monitor as directed by your health care provider.  Keep all follow-up visits as directed by your health care provider. This is important. Get help right away if:  You feel lightheaded or you faint.  You have trouble breathing.  You feel pain in your chest, upper arm, or jaw.  You feel sick to your stomach and your skin is pale, cool, or damp.  You heartbeat feels unusual or abnormal. This information is not intended to replace advice given to you by your health care provider. Make sure you discuss any questions you have with your health care provider. Document Released: 09/11/2004 Document Revised: 05/21/2016 Document Reviewed: 07/23/2014 Elsevier Interactive Patient Education  [employer].

## 2018-09-05 ENCOUNTER — Telehealth: Payer: Self-pay | Admitting: *Deleted

## 2018-09-05 ENCOUNTER — Other Ambulatory Visit: Payer: Self-pay | Admitting: Obstetrics & Gynecology

## 2018-09-05 DIAGNOSIS — Z1231 Encounter for screening mammogram for malignant neoplasm of breast: Secondary | ICD-10-CM

## 2018-09-05 MED ORDER — ESTRADIOL 0.1 MG/GM VA CREA: TOPICAL_CREAM | VAGINAL | 5 refills | Status: DC

## 2018-09-05 NOTE — Telephone Encounter (Signed)
Patient called requesting refill on estrace vaginal cream 0.1 mg per [facility] chart scanned in epic estrace 0.1 vaginal cream 0/1/ thin vulvar applicator twice weekly. Rx sent.

## 2018-09-07 ENCOUNTER — Ambulatory Visit
Admission: RE | Admit: 2018-09-07 | Discharge: 2018-09-07 | Disposition: A | Discharge status: Home / Self Care | Payer: BLUE CROSS/BLUE SHIELD | Source: Ambulatory Visit | Attending: Obstetrics & Gynecology | Admitting: Obstetrics & Gynecology

## 2018-09-07 DIAGNOSIS — Z1231 Encounter for screening mammogram for malignant neoplasm of breast: Secondary | ICD-10-CM

## 2018-09-08 ENCOUNTER — Other Ambulatory Visit: Payer: Self-pay | Admitting: Obstetrics & Gynecology

## 2018-09-08 DIAGNOSIS — R928 Other abnormal and inconclusive findings on diagnostic imaging of breast: Secondary | ICD-10-CM

## 2018-09-09 ENCOUNTER — Ambulatory Visit (INDEPENDENT_AMBULATORY_CARE_PROVIDER_SITE_OTHER): Payer: BLUE CROSS/BLUE SHIELD

## 2018-09-09 DIAGNOSIS — R079 Chest pain, unspecified: Secondary | ICD-10-CM

## 2018-09-09 DIAGNOSIS — R002 Palpitations: Secondary | ICD-10-CM

## 2018-09-09 NOTE — Progress Notes (Signed)
Stress echocardiogram with limited exam has been performed.  [place]

## 2018-09-12 ENCOUNTER — Ambulatory Visit
Admission: RE | Admit: 2018-09-12 | Discharge: 2018-09-12 | Disposition: A | Discharge status: Home / Self Care | Payer: BLUE CROSS/BLUE SHIELD | Source: Ambulatory Visit | Attending: Obstetrics & Gynecology | Admitting: Obstetrics & Gynecology

## 2018-09-12 DIAGNOSIS — R928 Other abnormal and inconclusive findings on diagnostic imaging of breast: Secondary | ICD-10-CM

## 2018-09-27 ENCOUNTER — Other Ambulatory Visit: Payer: Self-pay

## 2018-09-27 MED ORDER — BUSPIRONE HCL 15 MG PO TABS: 15.0000 mg | ORAL_TABLET | Freq: Two times a day (BID) | ORAL | 0 refills | Status: DC

## 2018-09-27 NOTE — Telephone Encounter (Signed)
RX request from pharmacy, sent RX but advised patient to call to schedule appointment via RX.

## 2018-09-29 ENCOUNTER — Ambulatory Visit (INDEPENDENT_AMBULATORY_CARE_PROVIDER_SITE_OTHER): Payer: BLUE CROSS/BLUE SHIELD | Admitting: Cardiology

## 2018-09-29 ENCOUNTER — Encounter: Payer: Self-pay | Admitting: Cardiology

## 2018-09-29 VITALS — BP 120/80 | HR 78 | Ht 66.0 in | Wt 183.4 lb

## 2018-09-29 DIAGNOSIS — I493 Ventricular premature depolarization: Secondary | ICD-10-CM | POA: Diagnosis not present

## 2018-09-29 DIAGNOSIS — R0609 Other forms of dyspnea: Secondary | ICD-10-CM | POA: Diagnosis not present

## 2018-09-29 DIAGNOSIS — D860 Sarcoidosis of lung: Secondary | ICD-10-CM | POA: Diagnosis not present

## 2018-09-29 DIAGNOSIS — R0600 Dyspnea, unspecified: Secondary | ICD-10-CM

## 2018-09-29 NOTE — Patient Instructions (Signed)

## 2018-09-29 NOTE — Progress Notes (Signed)
Cardiology Office Note:    Date:  09/29/2018   ID:  Norma Lopez, DOB 1961/08/05, MRN [id number]  PCP:  Verdell Carmine., MD  Cardiologist:  Jenne Campus, MD    Referring MD: Verdell Carmine., MD   Chief Complaint  Patient presents with  . Follow up on testing  Doing better looking forward to go to cruise to [place]  History of Present Illness:    Norma Lopez is a 57 y.o. female with sarcoidosis also palpitations.  Also had atypical chest pain.  Stress test was done which was negative she wear monitor which showed only premature supraventricular ventricular ectopy.  Nothing concerning.  She is very frustrated with her life there is a lot of responsibility that she has luckily she is going with her husband to [place] for cruise for a week and she should be enjoyed she does not remember the last time when she went somewhere with her husband.  Past Medical History:  Diagnosis Date  . ADD (attention deficit disorder with hyperactivity)   . Allergic rhinitis   . Allergy   . Asthma   . Diabetes ([place])   . 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 GERD (gastroesophageal reflux disease)   . GERD (gastroesophageal reflux disease)   . Hyperlipidemia   . Multiple lung nodules on CT   . Panic disorder   . PTSD (post-traumatic stress disorder)   . Sleep apnea   . Sleep apnea    patient reports using CPAP machine at night  . Thyroid nodule     Past Surgical History:  Procedure Laterality Date  . BREAST EXCISIONAL BIOPSY Left 1985, 2006   Benign  . CHOLECYSTECTOMY    . KNEE ARTHROSCOPY     x3  . TONSILLECTOMY    . TOTAL ABDOMINAL HYSTERECTOMY  1992    Current Medications: Current Meds  Medication Sig  . acetaminophen (TYLENOL) 325 MG tablet Take 325 mg by mouth every 6 (six) hours as needed for mild pain.   Marland Kitchen albuterol (PROVENTIL HFA;VENTOLIN HFA) 108 (90 BASE) MCG/ACT inhaler Inhale 2 puffs into the lungs every 6 (six) hours as needed for wheezing or shortness of breath.   Marland Kitchen aspirin EC  81 MG tablet Take 1 tablet (81 mg total) by mouth daily.  Marland Kitchen AUVI-Q 0.3 MG/0.3ML SOAJ injection Use as directed for life-threatening allergic reaction.  . busPIRone (BUSPAR) 15 MG tablet Take 1 tablet (15 mg total) by mouth 2 (two) times daily.  . Cholecalciferol (VITAMIN D3) 2000 UNITS capsule Take 1,000 Units by mouth daily.   Marland Kitchen desvenlafaxine (PRISTIQ) 100 MG 24 hr tablet Take 100 mg by mouth daily.  Marland Kitchen dexlansoprazole (DEXILANT) 60 MG capsule Take 60 mg by mouth daily.   . DOCOSAHEXAENOIC ACID PO Take 1 g by mouth daily.   Marland Kitchen EQUETRO 300 MG CP12 Take 1 capsule by mouth at bedtime.  Marland Kitchen estradiol (ESTRACE VAGINAL) 0.1 MG/GM vaginal cream Apply 1/4 applicator thin vulvar twice weekly  . estradiol (VIVELLE-DOT) 0.075 MG/24HR Place one patch on the skin once weekly.  . Fluticasone Furoate 100 MCG/ACT AEPB Inhale 1 puff into the lungs daily.   . folic acid (FOLVITE) 1 MG tablet Take 1 mg by mouth daily.  Marland Kitchen gabapentin (NEURONTIN) 100 MG capsule Take 600 mg by mouth daily.   . InFLIXimab (REMICADE IV) Inject into the vein.  Laurina Bustle Oil 80 MG CAPS Take 1 capsule by mouth at bedtime.  Marland Kitchen loratadine (CLARITIN) 10 MG tablet Take 10  mg by mouth daily as needed for allergies.   . Magnesium Oxide 500 MG TABS Take 500 mg by mouth at bedtime.   . metFORMIN (GLUCOPHAGE) 500 MG tablet Take by mouth 2 (two) times daily with a meal.  . methotrexate 50 MG/2ML injection INJECT 0.3 ML UNDER THE SKIN EVERY 7 DAYS.  Marland Kitchen OXYGEN Inhale 2 L into the lungs at bedtime.   . ranitidine (ZANTAC) 75 MG tablet Take 75 mg by mouth at bedtime.   . rosuvastatin (CRESTOR) 10 MG tablet Take 10 mg by mouth once a week.  Marland Kitchen UNABLE TO FIND Med Name: CBD oil     Allergies:   Latex; Contrast media [iodinated diagnostic agents]; Tape; Oxycodone hcl; Azithromycin; Shellfish allergy; Symbicort [budesonide-formoterol fumarate]; and Wellbutrin [bupropion]   Social History   Socioeconomic History  . Marital status: Married    Spouse  name: Not on file  . Number of children: Not on file  . Years of education: Not on file  . Highest education level: Not on file  Occupational History    Employer: [employer]  . Occupation: Therapist, sports  Social Needs  . Financial resource strain: Not on file  . Food insecurity:    Worry: Not on file    Inability: Not on file  . Transportation needs:    Medical: Not on file    Non-medical: Not on file  Tobacco Use  . Smoking status: Former Smoker    Years: 6.00    Last attempt to quit: 12/28/1996    Years since quitting: 21.7  . Smokeless tobacco: Never Used  . Tobacco comment: Exposed to second hand smoke  Substance and Sexual Activity  . Alcohol use: No  . Drug use: No  . Sexual activity: Yes    Partners: Male    Comment: 1st intercourse- 37, partners- 20, married- 12 yrs   Lifestyle  . Physical activity:    Days per week: Not on file    Minutes per session: Not on file  . Stress: Not on file  Relationships  . Social connections:    Talks on phone: Not on file    Gets together: Not on file    Attends religious service: Not on file    Active member of club or organization: Not on file    Attends meetings of clubs or organizations: Not on file    Relationship status: Not on file  Other Topics Concern  . Not on file  Social History Narrative   Married with 3 children   3-4 cups caffeine per day   Exercises 2-3 times per week     Family History: The patient's family history includes Alcohol abuse in her father; Allergies in her other; Anxiety disorder in her mother and other; Arthritis in her mother and other; Asthma in her sister and sister; Breast cancer (age of onset: 44) in her sister; COPD in her father and mother; Cancer in her father and sister; Depression in her mother; Diabetes in her father and sister; Heart attack in her father; Heart disease in her father; Hyperlipidemia in her other; Obesity in her other; Sleep apnea in her sister. ROS:   Please see the history of present  illness.    All 14 point review of systems negative except as described per history of present illness  EKGs/Labs/Other Studies Reviewed:      Recent Labs: No results found for requested labs within last 8760 hours.  Recent Lipid Panel No results found for: CHOL, TRIG, HDL,  CHOLHDL, VLDL, LDLCALC, LDLDIRECT  Physical Exam:    VS:  BP 120/80   Pulse 78   Ht 5\' 6"  (1.676 m)   Wt 183 lb 6.4 oz (83.2 kg)   SpO2 95%   BMI 29.60 kg/m     Wt Readings from Last 3 Encounters:  09/29/18 183 lb 6.4 oz (83.2 kg)  08/26/18 186 lb (84.4 kg)  07/05/18 184 lb 12.8 oz (83.8 kg)     GEN:  Well nourished, well developed in no acute distress HEENT: Normal NECK: No JVD; No carotid bruits LYMPHATICS: No lymphadenopathy CARDIAC: RRR, no murmurs, no rubs, no gallops RESPIRATORY:  Clear to auscultation without rales, wheezing or rhonchi  ABDOMEN: Soft, non-tender, non-distended MUSCULOSKELETAL:  No edema; No deformity  SKIN: Warm and dry LOWER EXTREMITIES: no swelling NEUROLOGIC:  Alert and oriented x 3 PSYCHIATRIC:  Normal affect   ASSESSMENT:    1. Ventricular premature depolarization   2. Dyspnea on exertion   3. Sarcoidosis of lung ([place])    PLAN:    In order of problems listed above:  1. Premature ventricle contractions does not bother her much.  I would not change any of her medications right now. 2. Dyspnea on exertion stress test negative echocardiogram preserved ejection fraction. 3. Sarcoidosis followed by internal medicine team   Medication Adjustments/Labs and Tests Ordered: Current medicines are reviewed at length with the patient today.  Concerns regarding medicines are outlined above.  No orders of the defined types were placed in this encounter.  Medication changes: No orders of the defined types were placed in this encounter.   Signed, Park Liter, MD, [facility] 09/29/2018 2:46 PM    [place]

## 2018-10-22 ENCOUNTER — Other Ambulatory Visit: Payer: Self-pay | Admitting: Obstetrics & Gynecology

## 2018-10-24 NOTE — Telephone Encounter (Signed)
Patient called requesting refill on estrace vaginal cream 0.1 mg per [facility] chart scanned in epic estrace 0.1 vaginal cream 8/0/ thin vulvar applicator twice weekly.

## 2018-11-15 DIAGNOSIS — D72819 Decreased white blood cell count, unspecified: Secondary | ICD-10-CM

## 2018-11-15 HISTORY — DX: Decreased white blood cell count, unspecified: D72.819

## 2019-01-03 ENCOUNTER — Ambulatory Visit: Payer: BLUE CROSS/BLUE SHIELD | Admitting: Allergy

## 2019-01-10 ENCOUNTER — Ambulatory Visit: Payer: BLUE CROSS/BLUE SHIELD | Admitting: Allergy

## 2019-01-17 ENCOUNTER — Ambulatory Visit: Payer: Self-pay | Admitting: Allergy

## 2019-01-30 DIAGNOSIS — M47812 Spondylosis without myelopathy or radiculopathy, cervical region: Secondary | ICD-10-CM | POA: Insufficient documentation

## 2019-01-30 DIAGNOSIS — M47814 Spondylosis without myelopathy or radiculopathy, thoracic region: Secondary | ICD-10-CM

## 2019-01-30 HISTORY — DX: Spondylosis without myelopathy or radiculopathy, thoracic region: M47.814

## 2019-01-30 HISTORY — DX: Spondylosis without myelopathy or radiculopathy, cervical region: M47.812

## 2019-02-01 ENCOUNTER — Other Ambulatory Visit: Payer: Self-pay | Admitting: Psychiatry

## 2019-02-01 NOTE — Telephone Encounter (Signed)
Need paper chart and last ov

## 2019-04-14 ENCOUNTER — Telehealth: Payer: Self-pay | Admitting: Cardiology

## 2019-04-14 NOTE — Telephone Encounter (Signed)
Virtual Visit Pre-Appointment Phone Call  Steps For Call:  1. Confirm consent - "In the setting of the current Covid19 crisis, you are scheduled for a (phone or video) visit with your provider on (date) at (time).  Just as we do with many in-office visits, in order for you to participate in this visit, we must obtain consent.  If you'd like, I can send this to your mychart (if signed up) or email for you to review.  Otherwise, I can obtain your verbal consent now.  All virtual visits are billed to your insurance company just like a normal visit would be.  By agreeing to a virtual visit, we'd like you to understand that the technology does not allow for your provider to perform an examination, and thus may limit your provider's ability to fully assess your condition. If your provider identifies any concerns that need to be evaluated in person, we will make arrangements to do so.  Finally, though the technology is pretty good, we cannot assure that it will always work on either your or our end, and in the setting of a video visit, we may have to convert it to a phone-only visit.  In either situation, we cannot ensure that we have a secure connection.  Are you willing to proceed?" STAFF: Did the patient verbally acknowledge consent to telehealth visit? Document YES/NO here: YES  2. Confirm the BEST phone number to call the day of the visit by including in appointment notes  3. Give patient instructions for WebEx/MyChart download to smartphone as below or Doximity/Doxy.me if video visit (depending on what platform provider is using)  4. Advise patient to be prepared with their blood pressure, heart rate, weight, any heart rhythm information, their current medicines, and a piece of paper and pen handy for any instructions they may receive the day of their visit  5. Inform patient they will receive a phone call 15 minutes prior to their appointment time (may be from unknown caller ID) so they should be  prepared to answer  6. Confirm that appointment type is correct in Epic appointment notes (VIDEO vs PHONE)     TELEPHONE CALL NOTE  Norma Lopez has been deemed a candidate for a follow-up tele-health visit to limit community exposure during the Covid-19 pandemic. I spoke with the patient via phone to ensure availability of phone/video source, confirm preferred email & phone number, and discuss instructions and expectations.  I reminded Norma Lopez to be prepared with any vital sign and/or heart rhythm information that could potentially be obtained via home monitoring, at the time of her visit. I reminded Norma Lopez to expect a phone call at the time of her visit if her visit.  [name] 04/14/2019 1:40 PM   INSTRUCTIONS FOR DOWNLOADING THE [place] APP TO SMARTPHONE  - If Apple, ask patient to go to [employer] and type in WebEx in the search bar. [place] [employer], the blue/green circle. If Android, go to [employer] and type in [employer] in the search bar. The app is free but as with any other app downloads, their phone may require them to verify saved payment information or Apple/Android password.  - The patient does NOT have to create an account. - On the day of the visit, the assist will walk the patient through joining the meeting with the meeting number/password.  INSTRUCTIONS FOR DOWNLOADING THE MYCHART APP TO SMARTPHONE  - The patient must first make  sure to have activated MyChart and know their login information - If Apple, go to [employer] and type in MyChart in the search bar and download the app. If Android, ask patient to go to [employer] and type in [place] in the search bar and download the app. The app is free but as with any other app downloads, their phone may require them to verify saved payment information or Apple/Android password.  - The patient will need to then log into the app with their MyChart username and password, and select [facility] as their healthcare provider to link the account. When it is time for your visit, go to the MyChart app, find appointments, and click Begin Video Visit. Be sure to Select Allow for your device to access the Microphone and Camera for your visit. You will then be connected, and your provider will be with you shortly.  **If they have any issues connecting, or need assistance please contact MyChart service desk [phone])**  **If using a computer, in order to ensure the best quality for their visit they will need to use either of the following Internet Browsers: [employer], or Google Chrome**  IF USING DOXIMITY or DOXY.ME - The patient will receive a link just prior to their visit, either by text or email (to be determined day of appointment depending on if it's doxy.me or Doximity).     FULL LENGTH CONSENT FOR TELE-HEALTH VISIT   I hereby voluntarily request, consent and authorize [facility] and its employed or contracted physicians, physician assistants, nurse practitioners or other licensed health care professionals (the Practitioner), to provide me with telemedicine health care services (the Services") as deemed necessary by the treating Practitioner. I acknowledge and consent to receive the Services by the Practitioner via telemedicine. I understand that the telemedicine visit will involve communicating with the Practitioner through live audiovisual communication technology and the disclosure of certain medical information by electronic transmission. I acknowledge that I have been given the opportunity to request an in-person assessment or other available alternative prior to the telemedicine visit and am voluntarily participating in the telemedicine visit.  I understand that I have the right to withhold or withdraw my consent to the use of telemedicine in the course of my care at any time, without affecting my right to future care or treatment, and that the  Practitioner or I may terminate the telemedicine visit at any time. I understand that I have the right to inspect all information obtained and/or recorded in the course of the telemedicine visit and may receive copies of available information for a reasonable fee.  I understand that some of the potential risks of receiving the Services via telemedicine include:   Delay or interruption in medical evaluation due to technological equipment failure or disruption;  Information transmitted may not be sufficient (e.g. poor resolution of images) to allow for appropriate medical decision making by the Practitioner; and/or   In rare instances, security protocols could fail, causing a breach of personal health information.  Furthermore, I acknowledge that it is my responsibility to provide information about my medical history, conditions and care that is complete and accurate to the best of my ability. I acknowledge that Practitioner's advice, recommendations, and/or decision may be based on factors not within their control, such as incomplete or inaccurate data provided by me or distortions of diagnostic images or specimens that may result from electronic transmissions. I understand that the practice of medicine is not an exact  science and that Practitioner makes no warranties or guarantees regarding treatment outcomes. I acknowledge that I will receive a copy of this consent concurrently upon execution via email to the email address I last provided but may also request a printed copy by calling the office of [facility].    I understand that my insurance will be billed for this visit.   I have read or had this consent read to me.  I understand the contents of this consent, which adequately explains the benefits and risks of the Services being provided via telemedicine.   I have been provided ample opportunity to ask questions regarding this consent and the Services and have had my questions answered to my  satisfaction.  I give my informed consent for the services to be provided through the use of telemedicine in my medical care  By participating in this telemedicine visit I agree to the above.     Cardiac Questionnaire:    Since your last visit or hospitalization:    1. Have you been having new or worsening chest pain? No    2. Have you been having new or worsening shortness of breath? no 3. Have you been having new or worsening leg swelling, wt gain, or increase in abdominal girth (pants fitting more tightly)? no   4. Have you had any passing out spells? no    *A YES to any of these questions would result in the appointment being kept. *If all the answers to these questions are NO, we should indicate that given the current situation regarding the worldwide coronarvirus pandemic, at the recommendation of the CDC, we are looking to limit gatherings in our waiting area, and thus will reschedule their appointment beyond four weeks from today.   _____________   [id number] Pre-Screening Questions:   Do you currently have a fever? no (yes = cancel and refer to pcp for e-visit)  Have you recently travelled on a cruise, internationally, or to [place], [place], [place], [place], [place], or [place], [place]) ? no (yes = cancel, stay home, monitor symptoms, and contact pcp or initiate e-visit if symptoms develop)  Have you been in contact with someone that is currently pending confirmation of Covid19 testing or has been confirmed to have the [place] virus?  no (yes = cancel, stay home, away from tested individual, monitor symptoms, and contact pcp or initiate e-visit if symptoms develop)  Are you currently experiencing fatigue or cough? no (yes = pt should be prepared to have a mask placed at the time of their visit).

## 2019-04-20 ENCOUNTER — Encounter: Payer: Self-pay | Admitting: Cardiology

## 2019-04-20 ENCOUNTER — Other Ambulatory Visit: Payer: Self-pay

## 2019-04-20 ENCOUNTER — Telehealth (INDEPENDENT_AMBULATORY_CARE_PROVIDER_SITE_OTHER): Payer: BLUE CROSS/BLUE SHIELD | Admitting: Cardiology

## 2019-04-20 VITALS — BP 135/84 | HR 82 | Wt 199.0 lb

## 2019-04-20 DIAGNOSIS — R002 Palpitations: Secondary | ICD-10-CM

## 2019-04-20 DIAGNOSIS — I493 Ventricular premature depolarization: Secondary | ICD-10-CM

## 2019-04-20 DIAGNOSIS — R072 Precordial pain: Secondary | ICD-10-CM

## 2019-04-20 DIAGNOSIS — F418 Other specified anxiety disorders: Secondary | ICD-10-CM

## 2019-04-20 NOTE — Patient Instructions (Signed)
Medication Instructions:  Your physician recommends that you continue on your current medications as directed. Please refer to the Current Medication list given to you today.  If you need a refill on your cardiac medications before your next appointment, please call your pharmacy.   Lab work: None If you have labs (blood work) drawn today and your tests are completely normal, you will receive your results only by: Marland Kitchen MyChart Message (if you have MyChart) OR . A paper copy in the mail If you have any lab test that is abnormal or we need to change your treatment, we will call you to review the results.  Testing/Procedures: NOne  Follow-Up: At [facility], you and your health needs are our priority.  As part of our continuing mission to provide you with exceptional heart care, we have created designated Provider Care Teams.  These Care Teams include your primary Cardiologist (physician) and Advanced Practice Providers (APPs -  Physician Assistants and Nurse Practitioners) who all work together to provide you with the care you need, when you need it. You will need a follow up appointment in 5 months. Any Other Special Instructions Will Be Listed Below (If Applicable).

## 2019-04-20 NOTE — Progress Notes (Signed)
Virtual Visit via Video Note   This visit type was conducted due to national recommendations for restrictions regarding the COVID-19 Pandemic (e.g. social distancing) in an effort to limit this patient's exposure and mitigate transmission in our community.  Due to her co-morbid illnesses, this patient is at least at moderate risk for complications without adequate follow up.  This format is felt to be most appropriate for this patient at this time.  All issues noted in this document were discussed and addressed.  A limited physical exam was performed with this format.  Please refer to the patient's chart for her consent to telehealth for [facility].  Evaluation Performed:  Follow-up visit  This visit type was conducted due to national recommendations for restrictions regarding the COVID-19 Pandemic (e.g. social distancing).  This format is felt to be most appropriate for this patient at this time.  All issues noted in this document were discussed and addressed.  No physical exam was performed (except for noted visual exam findings with Video Visits).  Please refer to the patient's chart (MyChart message for video visits and phone note for telephone visits) for the patient's consent to telehealth for [facility].  Date:  04/20/2019  ID: Norma Lopez, DOB Apr 25, 1961, MRN [id number]   Patient Location: [facility] [place] [address]   Provider location:   [place] Office  PCP:  Verdell Carmine., MD  Cardiologist:  Jenne Campus, MD     Chief Complaint: Doing well  History of Present Illness:    Norma Lopez is a 58 y.o. female  who presents via audio/video conferencing for a telehealth visit today.  Past medical history significant for palpitations also atypical chest pain.  She also got anxiety.  Another history is sarcoidosis.  I will be managing her for palpitations she said she is doing well few weeks ago she had of having bronchitis she was tested for  coronavirus, luckily it was negative she was put on dose of Sakib and started getting better she described episode of dizziness when she was getting up finally she decided to check her blood pressure her blood pressure was in the neighborhood of 90 systolic.  She stopped by herself lisinopril and started feeling better still in spite of lack of this medication blood pressure maintained in about 1 31-5 20 systolic.  Doing well described to have some skipped beats but nothing frequent does not bother her much.  Overall she is happy and satisfied the way she feels.  Gradually recovering from her bronchitis.   The patient does not have symptoms concerning for COVID-19 infection (fever, chills, cough, or new SHORTNESS OF BREATH).    Prior CV studies:   The following studies were reviewed today:       Past Medical History:  Diagnosis Date  . ADD (attention deficit disorder with hyperactivity)   . Allergic rhinitis   . Allergy   . Asthma   . Diabetes ([place])   . Fibrosis of lung ([place])   . GERD (gastroesophageal reflux disease)   . GERD (gastroesophageal reflux disease)   . Hyperlipidemia   . Multiple lung nodules on CT   . Panic disorder   . PTSD (post-traumatic stress disorder)   . Sleep apnea   . Sleep apnea    patient reports using CPAP machine at night  . Thyroid nodule     Past Surgical History:  Procedure Laterality Date  . BREAST EXCISIONAL BIOPSY Left 1985, 2006  Benign  . CHOLECYSTECTOMY    . KNEE ARTHROSCOPY     x3  . TONSILLECTOMY    . TOTAL ABDOMINAL HYSTERECTOMY  1992     Current Meds  Medication Sig  . acetaminophen (TYLENOL) 325 MG tablet Take 325 mg by mouth every 6 (six) hours as needed for mild pain.   Marland Kitchen albuterol (PROVENTIL HFA;VENTOLIN HFA) 108 (90 BASE) MCG/ACT inhaler Inhale 2 puffs into the lungs every 6 (six) hours as needed for wheezing or shortness of breath.   . Armodafinil 150 MG tablet Take 1 tablet by mouth daily.  Marland Kitchen AUVI-Q 0.3 MG/0.3ML SOAJ  injection Use as directed for life-threatening allergic reaction.  . busPIRone (BUSPAR) 15 MG tablet Take 1 tablet (15 mg total) by mouth 2 (two) times daily. (Patient taking differently: Take 20 mg by mouth 2 (two) times daily. )  . Cholecalciferol (VITAMIN D3) 2000 UNITS capsule Take 1,000 Units by mouth daily.   Marland Kitchen desvenlafaxine (PRISTIQ) 100 MG 24 hr tablet TAKE 1 TABLET BY MOUTH EVERY DAY  . dexlansoprazole (DEXILANT) 60 MG capsule Take 60 mg by mouth daily.   . DOCOSAHEXAENOIC ACID PO Take 1 g by mouth daily.   Marland Kitchen EQUETRO 200 MG CP12 12 hr capsule Take 2 capsules by mouth at bedtime.   Marland Kitchen estradiol (ESTRACE) 0.1 MG/GM vaginal cream APPLY 1/4 APPLICATOR THIN VULVAR TWICE WEEKLY  . estradiol (VIVELLE-DOT) 0.075 MG/24HR Place one patch on the skin once weekly.  . Fluticasone Furoate 100 MCG/ACT AEPB Inhale 1 puff into the lungs daily.   Marland Kitchen L-Methylfolate 15 MG TABS Take 1 tablet by mouth daily.  Marland Kitchen loratadine (CLARITIN) 10 MG tablet Take 10 mg by mouth daily as needed for allergies.   . Magnesium Oxide 500 MG TABS Take 500 mg by mouth at bedtime.   . metFORMIN (GLUCOPHAGE) 500 MG tablet Take by mouth 2 (two) times daily with a meal.  . methotrexate 50 MG/2ML injection INJECT 0.3 ML UNDER THE SKIN EVERY 7 DAYS.  Marland Kitchen OXYGEN Inhale 2 L into the lungs at bedtime.   . rosuvastatin (CRESTOR) 10 MG tablet Take 10 mg by mouth 3 (three) times a week.       Family History: The patient's family history includes Alcohol abuse in her father; Allergies in an other family member; Anxiety disorder in her mother and another family member; Arthritis in her mother and another family member; Asthma in her sister and sister; Breast cancer (age of onset: 12) in her sister; COPD in her father and mother; Cancer in her father and sister; Depression in her mother; Diabetes in her father and sister; Heart attack in her father; Heart disease in her father; Hyperlipidemia in an other family member; Obesity in an other family  member; Sleep apnea in her sister.   ROS:   Please see the history of present illness.     All other systems reviewed and are negative.   Labs/Other Tests and Data Reviewed:     Recent Labs: No results found for requested labs within last 8760 hours.  Recent Lipid Panel No results found for: CHOL, TRIG, HDL, CHOLHDL, VLDL, LDLCALC, LDLDIRECT    Exam:    Vital Signs:  BP 135/84   Pulse 82   Wt 199 lb (90.3 kg)   SpO2 95%   BMI 32.12 kg/m     Wt Readings from Last 3 Encounters:  04/20/19 199 lb (90.3 kg)  09/29/18 183 lb 6.4 oz (83.2 kg)  08/26/18 186 lb (84.4 kg)  Well nourished, well developed in no acute distress. Alert awake oriented x3.  To be talking through the video link she is here in the living room.  No JVD no swelling of lower extremities she is very happy to be able to talk to me.  Diagnosis for this visit:   1. Palpitations   2. Precordial chest pain   3. Situational anxiety   4. Ventricular premature depolarization      ASSESSMENT & PLAN:    1.  Palpitations.  Infrequent does not bother her does not want to do anything overall does are not dangerous if she is happy with the way she feels I am absolutely fine with it. 2.  Precordial chest pain denies having any stress test was negative. 3.  Situational anxiety she is getting help for it. 4.  Premature ventricular beats.  Controlled.  COVID-19 Education: The signs and symptoms of COVID-19 were discussed with the patient and how to seek care for testing (follow up with PCP or arrange E-visit).  The importance of social distancing was discussed today.  Patient Risk:   After full review of this patients clinical status, I feel that they are at least moderate risk at this time.  Time:   Today, I have spent 18 minutes with the patient with telehealth technology discussing pt health issues.  I spent 5minutes reviewing her chart before the visit.  Visit was finished at 1:38 PM.    Medication  Adjustments/Labs and Tests Ordered: Current medicines are reviewed at length with the patient today.  Concerns regarding medicines are outlined above.  No orders of the defined types were placed in this encounter.  Medication changes: No orders of the defined types were placed in this encounter.    Disposition: Follow-up in 5 months  Signed, Park Liter, MD, [facility] 04/20/2019 1:49 PM    [place]

## 2019-04-23 ENCOUNTER — Other Ambulatory Visit: Payer: Self-pay | Admitting: Obstetrics & Gynecology

## 2019-07-11 ENCOUNTER — Other Ambulatory Visit: Payer: Self-pay

## 2019-07-11 MED ORDER — DESVENLAFAXINE SUCCINATE ER 100 MG PO TB24: 100.0000 mg | ORAL_TABLET | Freq: Every day | ORAL | 0 refills | Status: DC

## 2019-09-21 ENCOUNTER — Encounter: Payer: Self-pay | Admitting: Cardiology

## 2019-09-21 ENCOUNTER — Other Ambulatory Visit: Payer: Self-pay

## 2019-09-21 ENCOUNTER — Ambulatory Visit (INDEPENDENT_AMBULATORY_CARE_PROVIDER_SITE_OTHER): Payer: BC Managed Care – PPO | Admitting: Cardiology

## 2019-09-21 VITALS — BP 149/79 | HR 93 | Ht 66.0 in | Wt 216.0 lb

## 2019-09-21 DIAGNOSIS — I493 Ventricular premature depolarization: Secondary | ICD-10-CM | POA: Diagnosis not present

## 2019-09-21 DIAGNOSIS — D860 Sarcoidosis of lung: Secondary | ICD-10-CM

## 2019-09-21 DIAGNOSIS — F418 Other specified anxiety disorders: Secondary | ICD-10-CM | POA: Diagnosis not present

## 2019-09-21 DIAGNOSIS — R002 Palpitations: Secondary | ICD-10-CM

## 2019-09-21 MED ORDER — METOPROLOL TARTRATE 25 MG PO TABS: 25.0000 mg | ORAL_TABLET | Freq: Two times a day (BID) | ORAL | 3 refills | Status: DC

## 2019-09-21 NOTE — Patient Instructions (Signed)
Medication Instructions:  Your physician has recommended you make the following change in your medication:   START metoprolol tartrate (lopressor) 25 mg: Take 1 tablet twice daily   If you need a refill on your cardiac medications before your next appointment, please call your pharmacy.   Lab work: None  If you have labs (blood work) drawn today and your tests are completely normal, you will receive your results only by: Norma Lopez MyChart Message (if you have MyChart) OR . A paper copy in the mail If you have any lab test that is abnormal or we need to change your treatment, we will call you to review the results.  Testing/Procedures: None  Follow-Up: At [facility], you and your health needs are our priority.  As part of our continuing mission to provide you with exceptional heart care, we have created designated Provider Care Teams.  These Care Teams include your primary Cardiologist (physician) and Advanced Practice Providers (APPs -  Physician Assistants and Nurse Practitioners) who all work together to provide you with the care you need, when you need it. You will need a follow up appointment in 5 months.  Please call our office 2 months in advance to schedule this appointment.      Metoprolol tablets What is this medicine? METOPROLOL (me TOE proe lole) is a beta-blocker. Beta-blockers reduce the workload on the heart and help it to beat more regularly. This medicine is used to treat high blood pressure and to prevent chest pain. It is also used to after a heart attack and to prevent an additional heart attack from occurring. This medicine may be used for other purposes; ask your health care provider or pharmacist if you have questions. COMMON BRAND NAME(S): Lopressor What should I tell my health care provider before I take this medicine? They need to know if you have any of these conditions:  diabetes  heart or vessel disease like slow heart rate, worsening heart failure, heart  block, sick sinus syndrome or Raynaud's disease  kidney disease  liver disease  lung or breathing disease, like asthma or emphysema  pheochromocytoma  thyroid disease  an unusual or allergic reaction to metoprolol, other beta-blockers, medicines, foods, dyes, or preservatives  pregnant or trying to get pregnant  breast-feeding How should I use this medicine? Take this medicine by mouth with a drink of water. Follow the directions on the prescription label. Take this medicine immediately after meals. Take your doses at regular intervals. Do not take more medicine than directed. Do not stop taking this medicine suddenly. This could lead to serious heart-related effects. Talk to your pediatrician regarding the use of this medicine in children. Special care may be needed. Overdosage: If you think you have taken too much of this medicine contact a poison control center or emergency room at once. NOTE: This medicine is only for you. Do not share this medicine with others. What if I miss a dose? If you miss a dose, take it as soon as you can. If it is almost time for your next dose, take only that dose. Do not take double or extra doses. What may interact with this medicine? This medicine may interact with the following medications:  certain medicines for blood pressure, heart disease, irregular heart beat  certain medicines for depression like monoamine oxidase (MAO) inhibitors, fluoxetine, or paroxetine  clonidine  dobutamine  epinephrine  isoproterenol  reserpine This list may not describe all possible interactions. Give your health care provider a list of  all the medicines, herbs, non-prescription drugs, or dietary supplements you use. Also tell them if you smoke, drink alcohol, or use illegal drugs. Some items may interact with your medicine. What should I watch for while using this medicine? Visit your doctor or health care professional for regular check ups. Contact your  doctor right away if your symptoms worsen. Check your blood pressure and pulse rate regularly. Ask your health care professional what your blood pressure and pulse rate should be, and when you should contact them. You may get drowsy or dizzy. Do not drive, use machinery, or do anything that needs mental alertness until you know how this medicine affects you. Do not sit or stand up quickly, especially if you are an older patient. This reduces the risk of dizzy or fainting spells. Contact your doctor if these symptoms continue. Alcohol may interfere with the effect of this medicine. Avoid alcoholic drinks. This medicine may increase blood sugar. Ask your healthcare provider if changes in diet or medicines are needed if you have diabetes. What side effects may I notice from receiving this medicine? Side effects that you should report to your doctor or health care professional as soon as possible:  allergic reactions like skin rash, itching or hives  cold or numb hands or feet  depression  difficulty breathing  faint  fever with sore throat  irregular heartbeat, chest pain  rapid weight gain   signs and symptoms of high blood sugar such as being more thirsty or hungry or having to urinate more than normal. You may also feel very tired or have blurry vision.  swollen legs or ankles Side effects that usually do not require medical attention (report to your doctor or health care professional if they continue or are bothersome):  anxiety or nervousness  change in sex drive or performance  dry skin  headache  nightmares or trouble sleeping  short term memory loss  stomach upset or diarrhea This list may not describe all possible side effects. Call your doctor for medical advice about side effects. You may report side effects to FDA at 1-800-FDA-1088. Where should I keep my medicine? Keep out of the reach of children. Store at room temperature between 15 and 30 degrees C (59 and 86  degrees F). Throw away any unused medicine after the expiration date. NOTE: This sheet is a summary. It may not cover all possible information. If you have questions about this medicine, talk to your doctor, pharmacist, or health care provider.  2020 Elsevier/Gold Standard (2018-10-04 11:15:23)
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
## 2019-09-26 ENCOUNTER — Encounter: Payer: Self-pay | Admitting: Gynecology

## 2019-10-07 ENCOUNTER — Other Ambulatory Visit: Payer: Self-pay | Admitting: Cardiology

## 2019-10-27 DIAGNOSIS — F411 Generalized anxiety disorder: Secondary | ICD-10-CM | POA: Insufficient documentation

## 2019-10-30 ENCOUNTER — Other Ambulatory Visit: Payer: Self-pay | Admitting: Obstetrics & Gynecology

## 2019-10-30 NOTE — Telephone Encounter (Signed)
Patient has annual scheduled on 01/10/20, she did not schedule annual for 2020

## 2019-11-06 IMAGING — MG DIGITAL SCREENING BILATERAL MAMMOGRAM WITH TOMO AND CAD
8 series · 8 of 24 positions shown · non-contrast
Comparison: Previous exam(s).

CLINICAL DATA: Screening.

EXAM:
DIGITAL SCREENING BILATERAL MAMMOGRAM WITH TOMO AND CAD

[R MLO synth-2D]
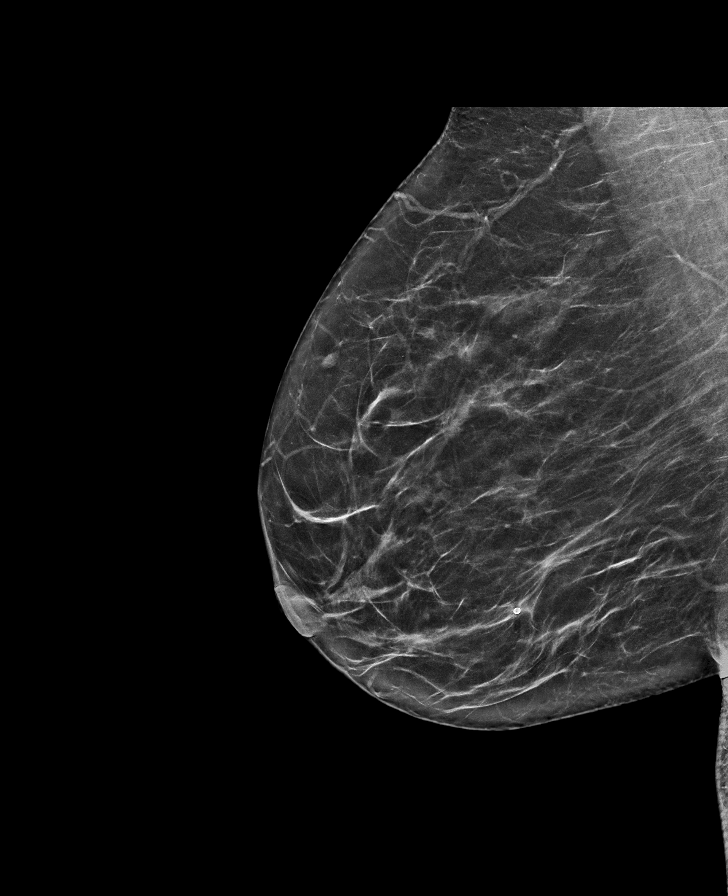

[R CC synth-2D]
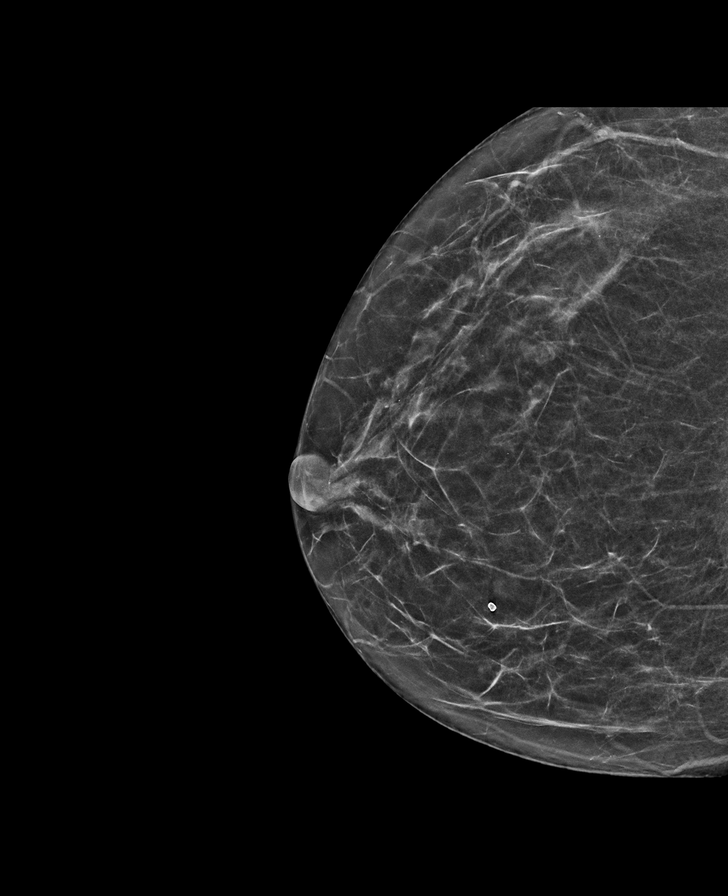

[L CC synth-2D]
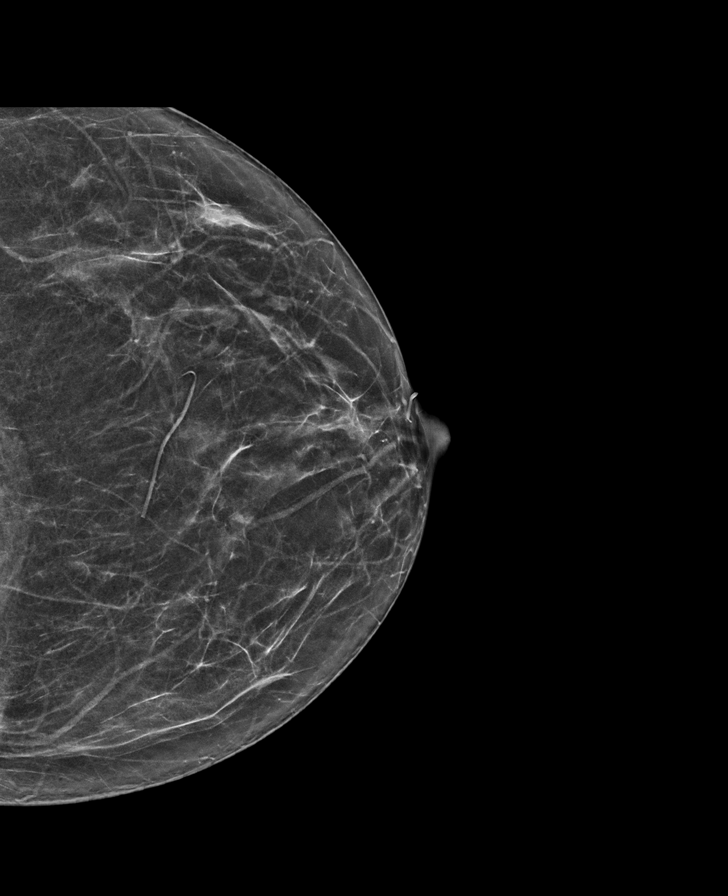

[L MLO synth-2D]
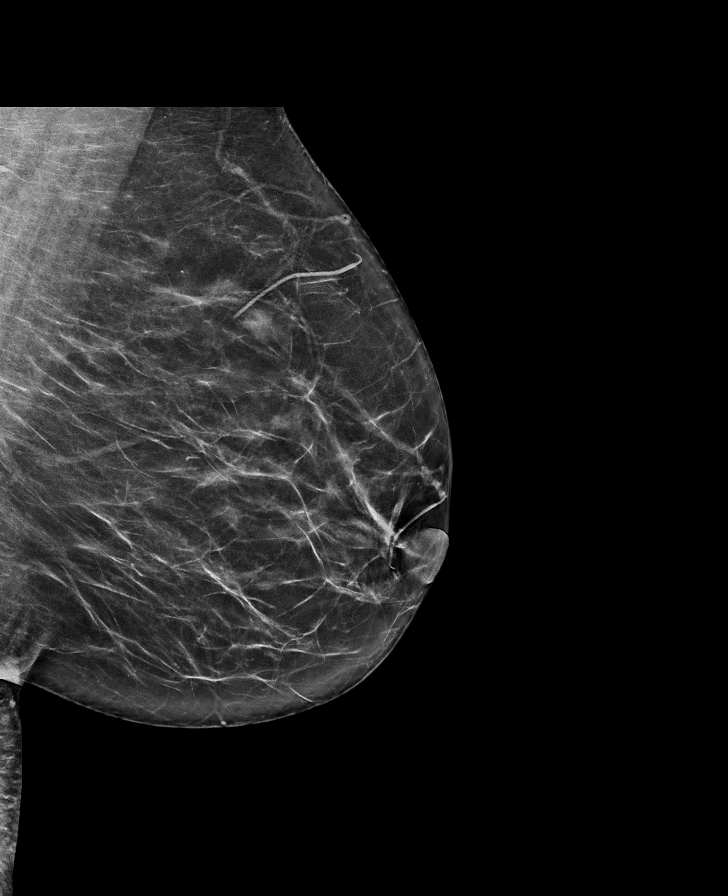

[R CC tomo · tomo slice 29/56.0]
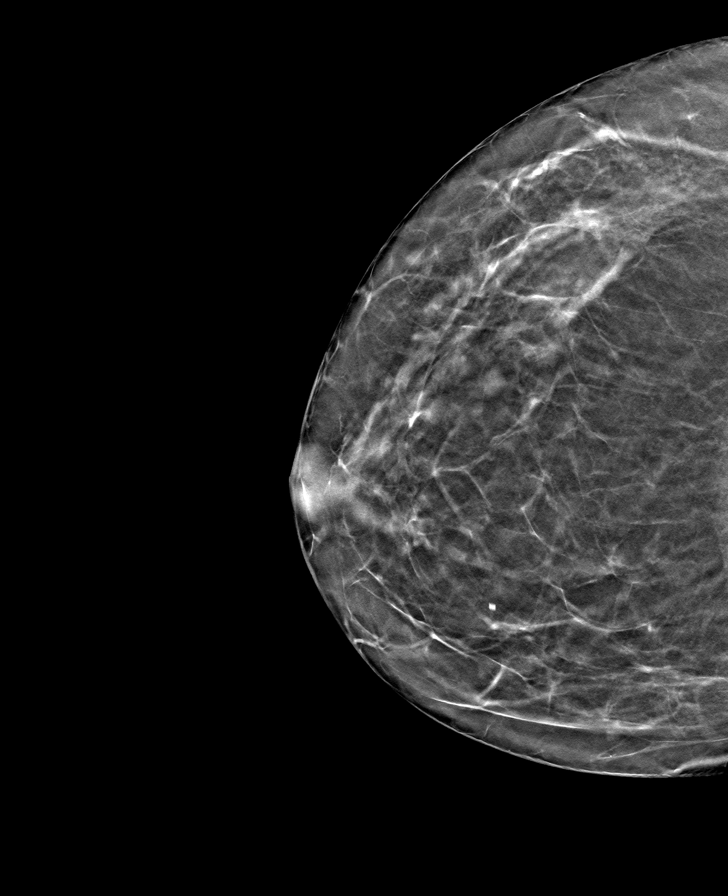

[L MLO tomo · tomo slice 37/74.0]
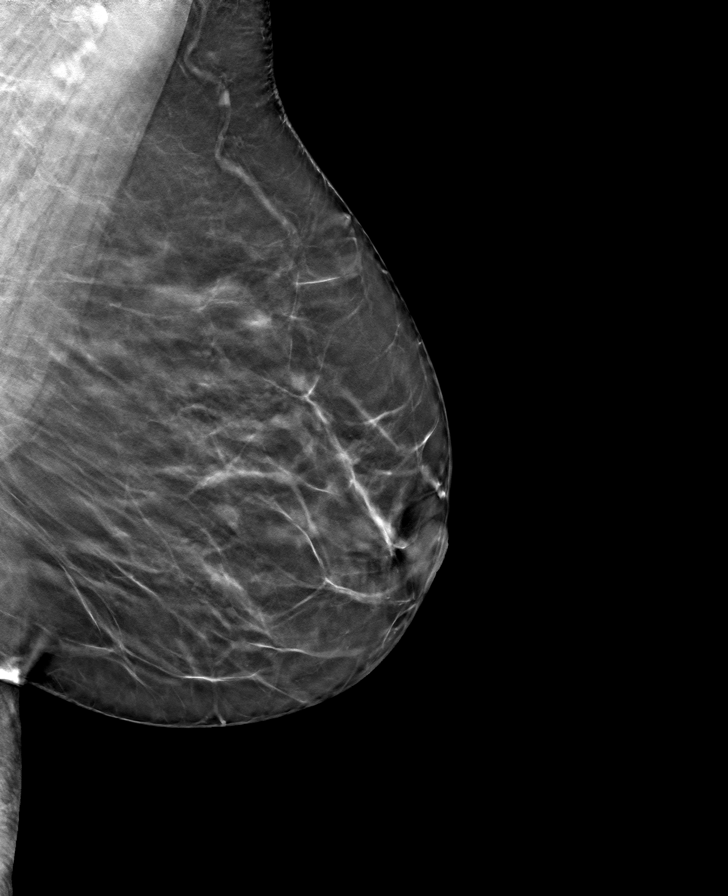

[R MLO tomo · tomo slice 35/70.0]
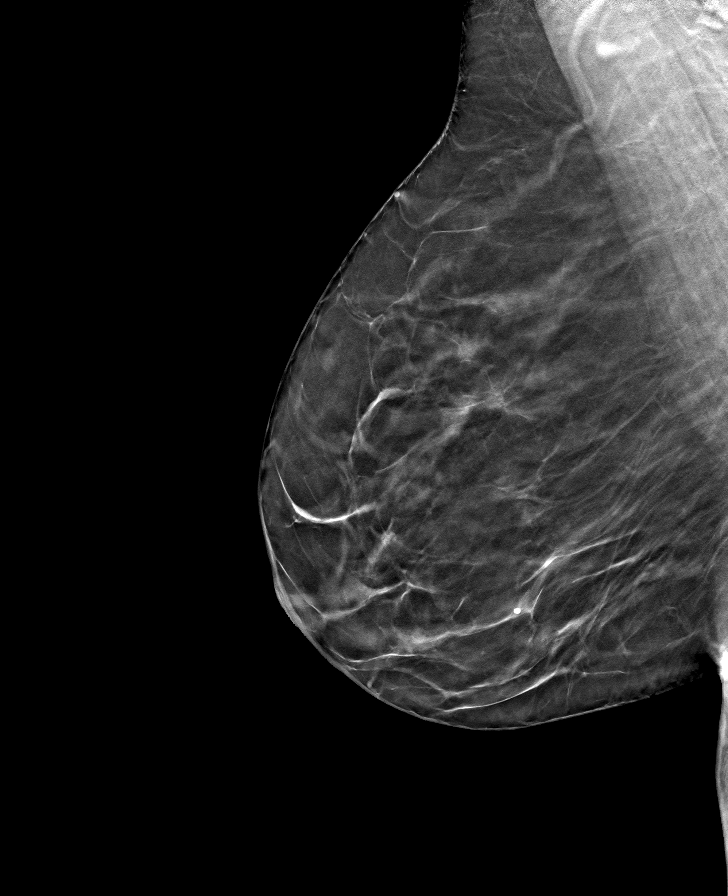

[L CC tomo · tomo slice 31/61.0]
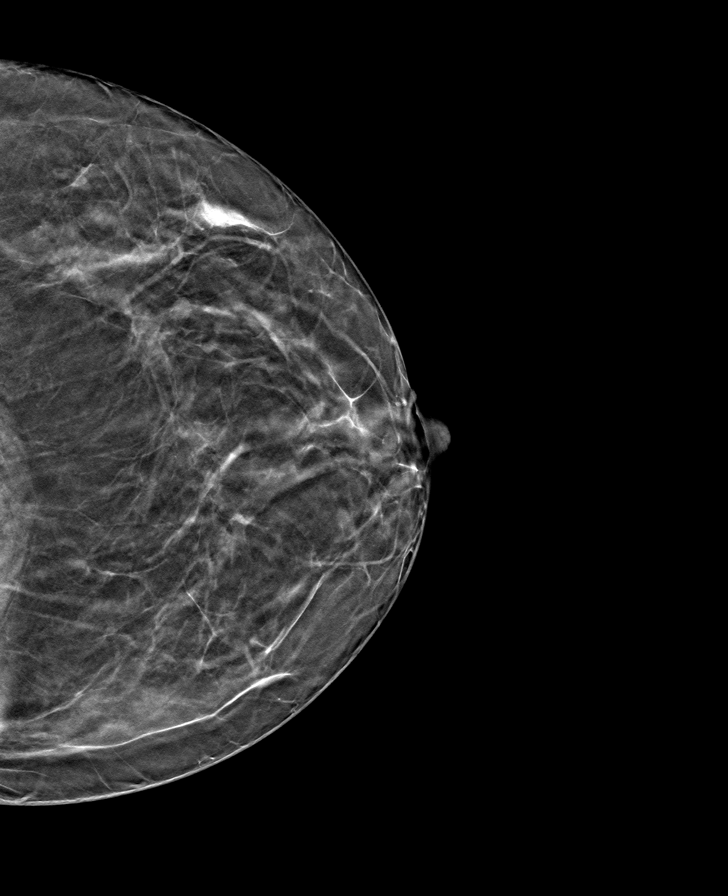

[8 of 24 positions shown; findings below may reference images not displayed]

ACR Breast Density Category b: There are scattered areas of
fibroglandular density.
FINDINGS: In the left breast, a possible asymmetry warrants further
evaluation. This possible asymmetry is seen within the upper LEFT
breast, at middle to posterior depth, in the vicinity of patient's
previous surgical excision as indicated by overlying skin marker,
tomosynthesis MLO slice 56. There is a possible correlate within the
outer LEFT breast on the CC projection, slice 49.

In the right breast, no findings suspicious for malignancy. Images
were processed with CAD.
IMPRESSION: Further evaluation is suggested for possible asymmetry in the left
breast.

RECOMMENDATION:
Diagnostic mammogram and possibly ultrasound of the left breast.
(Code:[id number])

The patient will be contacted regarding the findings, and additional
imaging will be scheduled.

BI-RADS CATEGORY  0: Incomplete. Need additional imaging evaluation
and/or prior mammograms for comparison.

## 2019-11-20 ENCOUNTER — Other Ambulatory Visit: Payer: Self-pay | Admitting: Obstetrics & Gynecology

## 2019-11-20 DIAGNOSIS — Z1231 Encounter for screening mammogram for malignant neoplasm of breast: Secondary | ICD-10-CM

## 2019-11-21 ENCOUNTER — Other Ambulatory Visit: Payer: Self-pay | Admitting: Orthopedic Surgery

## 2019-11-21 ENCOUNTER — Ambulatory Visit
Admission: RE | Admit: 2019-11-21 | Discharge: 2019-11-21 | Disposition: A | Discharge status: Home / Self Care | Payer: BC Managed Care – PPO | Source: Ambulatory Visit | Attending: Obstetrics & Gynecology | Admitting: Obstetrics & Gynecology

## 2019-11-21 ENCOUNTER — Other Ambulatory Visit: Payer: Self-pay

## 2019-11-21 ENCOUNTER — Other Ambulatory Visit: Payer: BC Managed Care – PPO

## 2019-11-21 DIAGNOSIS — R52 Pain, unspecified: Secondary | ICD-10-CM

## 2019-11-21 DIAGNOSIS — M2530 Other instability, unspecified joint: Secondary | ICD-10-CM

## 2019-11-21 DIAGNOSIS — Z1231 Encounter for screening mammogram for malignant neoplasm of breast: Secondary | ICD-10-CM

## 2019-12-03 ENCOUNTER — Ambulatory Visit
Admission: RE | Admit: 2019-12-03 | Discharge: 2019-12-03 | Disposition: A | Discharge status: Home / Self Care | Payer: BC Managed Care – PPO | Source: Ambulatory Visit | Attending: Orthopedic Surgery | Admitting: Orthopedic Surgery

## 2019-12-03 ENCOUNTER — Other Ambulatory Visit: Payer: Self-pay

## 2019-12-03 DIAGNOSIS — R52 Pain, unspecified: Secondary | ICD-10-CM

## 2019-12-03 DIAGNOSIS — M2530 Other instability, unspecified joint: Secondary | ICD-10-CM

## 2020-01-07 ENCOUNTER — Other Ambulatory Visit: Payer: Self-pay | Admitting: Obstetrics & Gynecology

## 2020-01-08 ENCOUNTER — Other Ambulatory Visit: Payer: Self-pay

## 2020-01-08 ENCOUNTER — Telehealth (INDEPENDENT_AMBULATORY_CARE_PROVIDER_SITE_OTHER): Payer: BC Managed Care – PPO | Admitting: Gastroenterology

## 2020-01-08 ENCOUNTER — Encounter: Payer: Self-pay | Admitting: Gastroenterology

## 2020-01-08 VITALS — BP 126/74 | Ht 66.0 in | Wt 220.0 lb

## 2020-01-08 DIAGNOSIS — Z800 Family history of malignant neoplasm of digestive organs: Secondary | ICD-10-CM | POA: Diagnosis not present

## 2020-01-08 DIAGNOSIS — K2100 Gastro-esophageal reflux disease with esophagitis, without bleeding: Secondary | ICD-10-CM

## 2020-01-08 MED ORDER — FAMOTIDINE 20 MG PO TABS: 20.0000 mg | ORAL_TABLET | Freq: Every day | ORAL | 6 refills | Status: DC

## 2020-01-08 MED ORDER — DEXLANSOPRAZOLE 60 MG PO CPDR: 60.0000 mg | DELAYED_RELEASE_CAPSULE | Freq: Every day | ORAL | 6 refills | Status: DC

## 2020-01-08 NOTE — Addendum Note (Signed)
Addended by: Karena Addison on: 01/08/2020 02:34 PM   Modules accepted: Orders

## 2020-01-08 NOTE — Telephone Encounter (Signed)
Annual exam scheduled on 01/10/20

## 2020-01-08 NOTE — Progress Notes (Signed)
Chief Complaint:   Referring Provider:  Verdell Carmine., MD      ASSESSMENT AND PLAN;   #1.  GERD with H/O erosive esophagitis and small HH.  #2.  Rectal incontinence likely d/t magnesium.  #3. FH colon cancer (sister at age 58). Neg colon 05/2017 except for 4 mm TA, pancolonic div. Next due 05/2022  Plan:  -Continue Dexilant 60 qpm, pepcid 20mg  po qAM -Stop Mg d/t incontinence. -I have instructed her to reduce weight.  Start exercising like walking 30 min/day (has dog now).  Avoid fried foods.  Monitor weight. -If incontinence is not better, she will let us know.  Then will perform further WU by means of anorectal manometry. -FU in 12 weeks.  Earlier, if still with problems.   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  Better now since she started Pepcid in the morning and Dexilant in the evening.  Has gained weight from 178pounds to 220 pounds over last 6 months.  Her symptoms became worse when she was taken off Zantac.  No odynophagia or dysphagia.  Has softer bowel movements.  Main complaint was that of incontinence especially in the morning.  She has been taking magnesium supplements at night.  Metformin has been reduced to 500 mg p.o. twice daily previously.  No significant diarrhea though.  She would occasionally get constipated requiring as needed stool softeners.  No fever chills or night sweats.  Also seen by GYN-likely has a rectocele.  Has sarcoidosis on MTX, being constideded for humira.  Also has associated ILD, sleep apnea and requires oxygen at night.  Past GI procedures: -Colonoscopy 05/2017 ([facility]): 4 mm colonic polyp s/p polypectomy (TA), pancolonic diverticulosis -EGD 06/2008: Healed distal esophageal erosions, small HH, irregular Z-line with neg bx for Barrett's, mild gastritis. S/P dil 48Fr. Neg SB Bx for celiac, neg CLO, neg eso Bx.  Past Medical History:  Diagnosis Date  . ADD (attention deficit disorder with  hyperactivity)   . Allergic rhinitis   . Allergy   . Anxiety and depression   . Asthma   . COPD (chronic obstructive pulmonary disease) ([place])   . Diabetes ([place])   . Diarrhea, functional   . Diverticulosis   . Family history of colon cancer   . Fibrosis of lung ([place])   . GERD (gastroesophageal reflux disease)   . History of pneumoconiosis    with ILD with nocturnal hypoxemia on 02 2 l/min  . Hyperlipidemia   . IBS (irritable bowel syndrome)   . Migraines   . Multiple lung nodules on CT   . Obesity   . Oropharyngeal dysphagia   . Panic disorder   . PTSD (post-traumatic stress disorder)   . Rectal incontinence   . Restless leg syndrome   . Sarcoidosis   . Sleep apnea    patient reports using CPAP machine at night  . Thyroid nodule     Past Surgical History:  Procedure Laterality Date  . BREAST EXCISIONAL BIOPSY Left 1985, 2006   Benign  . CHOLECYSTECTOMY    . COLONOSCOPY  06/23/2017   Colonic polyp status post polypectomy. Pancolonic diverticulosis predominantly in the sigmoid colon'  . ESOPHAGOGASTRODUODENOSCOPY  07/25/2008   Healed esophageal erosions. Irregular Z-line suggestive of gastroesophageal reflux (biopsied to rule out short-segment Barrett's esophagus) Mild gastritis.  Marland Kitchen KNEE ARTHROSCOPY     x3  . TONSILLECTOMY    . TOTAL ABDOMINAL HYSTERECTOMY  [place]    . veins stripped  Left 1989    Family History  Problem Relation Age of Onset  . COPD Mother   . Arthritis Mother   . Depression Mother   . Anxiety disorder Mother   . COPD Father   . Alcohol abuse Father   . Heart disease Father   . Diabetes Father   . Cancer Father        lung  . Heart attack Father   . Asthma Sister   . Cancer Sister 59       colon  . Asthma Sister   . Diabetes Sister   . Breast cancer Sister 19  . Sleep apnea Sister   . Hyperlipidemia Other   . Allergies Other   . Obesity Other   . Anxiety disorder Other   . Arthritis Other     Social History    Tobacco Use  . Smoking status: Former Smoker    Years: 6.00    Quit date: 12/28/1996    Years since quitting: 23.0  . Smokeless tobacco: Never Used  . Tobacco comment: Exposed to second hand smoke  Substance Use Topics  . Alcohol use: No  . Drug use: No    Current Outpatient Medications  Medication Sig Dispense Refill  . acetaminophen (TYLENOL) 325 MG tablet Take 325 mg by mouth every 6 (six) hours as needed for mild pain.     Marland Kitchen albuterol (PROVENTIL HFA;VENTOLIN HFA) 108 (90 BASE) MCG/ACT inhaler Inhale 2 puffs into the lungs every 6 (six) hours as needed for wheezing or shortness of breath.     . Armodafinil 150 MG tablet Take 1 tablet by mouth daily.    Marland Kitchen AUVI-Q 0.3 MG/0.3ML SOAJ injection Use as directed for life-threatening allergic reaction. 4 Device 1  . busPIRone (BUSPAR) 15 MG tablet Take 1 tablet (15 mg total) by mouth 2 (two) times daily. (Patient taking differently: Take 20 mg by mouth 2 (two) times daily. ) 30 tablet 0  . Cholecalciferol (VITAMIN D3) 2000 UNITS capsule Take 1,000 Units by mouth daily.     Marland Kitchen desvenlafaxine (PRISTIQ) 100 MG 24 hr tablet Take 1 tablet (100 mg total) by mouth daily. 30 tablet 0  . dexlansoprazole (DEXILANT) 60 MG capsule Take 60 mg by mouth daily.     . DOCOSAHEXAENOIC ACID PO Take 1 g by mouth daily.     Marland Kitchen EQUETRO 200 MG CP12 12 hr capsule Take 2 capsules by mouth at bedtime.   0  . estradiol (VIVELLE-DOT) 0.075 MG/24HR Place one patch on the skin once weekly. 4 patch 10  . Fluticasone Furoate 100 MCG/ACT AEPB Inhale 1 puff into the lungs daily.     Marland Kitchen L-Methylfolate 15 MG TABS Take 1 tablet by mouth daily.    Marland Kitchen lisinopril (ZESTRIL) 10 MG tablet Take 5 mg by mouth daily as needed.     . loratadine (CLARITIN) 10 MG tablet Take 10 mg by mouth daily as needed for allergies.     . Magnesium Oxide 500 MG TABS Take 500 mg by mouth at bedtime.     . metFORMIN (GLUCOPHAGE) 500 MG tablet Take by mouth 2 (two) times daily with a meal.    . methotrexate  50 MG/2ML injection INJECT 0.3 ML UNDER THE SKIN EVERY 7 DAYS.  3  . metoprolol tartrate (LOPRESSOR) 25 MG tablet TAKE 1 TABLET BY MOUTH TWICE A DAY 180 tablet 1  . ondansetron (ZOFRAN) 4 MG tablet  Take 4 mg by mouth every 8 (eight) hours as needed for nausea or vomiting.    . OXYGEN Inhale 2 L into the lungs at bedtime.     . pramipexole (MIRAPEX) 0.125 MG tablet Take 0.375 mg by mouth at bedtime.    . rosuvastatin (CRESTOR) 10 MG tablet Take 10 mg by mouth 3 (three) times a week.   1  . estradiol (ESTRACE) 0.1 MG/GM vaginal cream APPLY 1/4 APPLICATOR THIN TO VULVAR TWICE WEEKLY 126 g 0   No current facility-administered medications for this visit.    Allergies  Allergen Reactions  . Latex Itching, Other (See Comments) and Rash    Blisters Blisters   . Contrast Media [Iodinated Diagnostic Agents] Itching  . Tape Rash    redness redness redness redness redness   . Oxycodone Hcl     REACTION: itching  . Azithromycin Palpitations  . Shellfish Allergy Rash  . Symbicort [Budesonide-Formoterol Fumarate] Palpitations  . Wellbutrin [Bupropion] Palpitations    Review of Systems:  neg     Physical Exam:    BP 126/74   Ht 5\' 6"  (1.676 m)   Wt 220 lb (99.8 kg)   BMI 35.51 kg/m  Wt Readings from Last 3 Encounters:  01/08/20 220 lb (99.8 kg)  09/21/19 216 lb (98 kg)  04/20/19 199 lb (90.3 kg)   Not examined since it was a televisit.  Data Reviewed: I have personally reviewed following labs and imaging studies  CBC: CBC Latest Ref Rng & Units 11/04/2015  WBC 4.0 - 10.5 K/uL 4.4  Hemoglobin 12.0 - 15.0 g/dL 13.6  Hematocrit 36.0 - 46.0 % 40.7  Platelets 150 - 400 K/uL 379    CMP: CMP Latest Ref Rng & Units 11/04/2015  Glucose 65 - 99 mg/dL 156(H)  BUN 6 - 20 mg/dL 15  Creatinine 0.44 - 1.00 mg/dL 0.98  Sodium 135 - 145 mmol/L 142  Potassium 3.5 - 5.1 mmol/L 3.5  Chloride 101 - 111 mmol/L 109  CO2 22 - 32 mmol/L 26  Calcium 8.9 - 10.3 mg/dL 8.9  Total Protein 6.5  - 8.1 g/dL 6.6  Total Bilirubin 0.3 - 1.2 mg/dL 0.8  Alkaline Phos 38 - 126 U/L 64  AST 15 - 41 U/L 23  ALT 14 - 54 U/L 22   I connected with  Norma Lopez on 01/08/20 by a video enabled telemedicine application and verified that I am speaking with the correct person using two identifiers.   I discussed the limitations of evaluation and management by telemedicine. The patient expressed understanding and agreed to proceed.  Time spent on call, review of records, counseling: 25 min     Carmell Austria, MD 01/08/2020, 10:00 AM  Cc: Verdell Carmine., MD

## 2020-01-08 NOTE — Patient Instructions (Signed)
If you are age 58 or older, your body mass index should be between 23-30. Your Body mass index is 35.51 kg/m. If this is out of the aforementioned range listed, please consider follow up with your Primary Care Provider.  If you are age 5 or younger, your body mass index should be between 19-25. Your Body mass index is 35.51 kg/m. If this is out of the aformentioned range listed, please consider follow up with your Primary Care Provider.   We have sent the following medications to your pharmacy for you to pick up at your convenience: Pepcid Dexilant  Stop Magnesium  Follow up in 12 weeks.   Thank you,  Dr. Jackquline Denmark

## 2020-01-09 ENCOUNTER — Other Ambulatory Visit: Payer: Self-pay

## 2020-01-10 ENCOUNTER — Ambulatory Visit (INDEPENDENT_AMBULATORY_CARE_PROVIDER_SITE_OTHER): Payer: BC Managed Care – PPO | Admitting: Obstetrics & Gynecology

## 2020-01-10 ENCOUNTER — Encounter: Payer: Self-pay | Admitting: Obstetrics & Gynecology

## 2020-01-10 VITALS — BP 124/76 | Ht 64.75 in | Wt 224.0 lb

## 2020-01-10 DIAGNOSIS — Z01419 Encounter for gynecological examination (general) (routine) without abnormal findings: Secondary | ICD-10-CM | POA: Diagnosis not present

## 2020-01-10 DIAGNOSIS — Z9079 Acquired absence of other genital organ(s): Secondary | ICD-10-CM

## 2020-01-10 DIAGNOSIS — Z79890 Hormone replacement therapy (postmenopausal): Secondary | ICD-10-CM | POA: Diagnosis not present

## 2020-01-10 DIAGNOSIS — Z90710 Acquired absence of both cervix and uterus: Secondary | ICD-10-CM | POA: Diagnosis not present

## 2020-01-10 DIAGNOSIS — Z90722 Acquired absence of ovaries, bilateral: Secondary | ICD-10-CM

## 2020-01-10 DIAGNOSIS — N816 Rectocele: Secondary | ICD-10-CM

## 2020-01-10 DIAGNOSIS — Z6837 Body mass index (BMI) 37.0-37.9, adult: Secondary | ICD-10-CM

## 2020-01-10 MED ORDER — ESTRADIOL 0.075 MG/24HR TD PTTW: MEDICATED_PATCH | TRANSDERMAL | 4 refills | Status: DC

## 2020-01-10 MED ORDER — ESTRADIOL 0.1 MG/GM VA CREA: TOPICAL_CREAM | VAGINAL | 3 refills | Status: DC

## 2020-01-10 NOTE — Patient Instructions (Signed)
1. Well female exam with routine gynecological exam Gynecologic exam status post total hysterectomy with BSO.  Pap test in February 2019 was negative, no indication to repeat this year.  Breast exam normal.  Screening mammogram November 2020 was negative.  Colonoscopy in 2018.  Patient has diverticulosis, no diverticulitis for the last 3 years.  Health labs with family physician.  2. H/O total hysterectomy with bilateral salpingo-oophorectomy (BSO)  3. Postmenopausal hormone replacement therapy Well on estradiol patch 0.075 weekly.  No contraindication to continue.  Also using estradiol cream vaginally and on the vulva twice a week.  Both prescriptions sent to pharmacy.  Bone density in March 2019 was normal, will repeat at 5 years.  Vitamin D supplements, calcium intake of 1200 mg daily and regular weightbearing physical activity is recommended.  4. Baden-Walker grade 2 rectocele No progression with only mild symptoms.  No indication for surgery at this time.  Patient reassured.  5. Class 2 severe obesity due to excess calories with serious comorbidity and body mass index (BMI) of 37.0 to 37.9 in adult [facility]) Recommend a low calorie/carb diet such as [employer] or [employer].  Aerobic physical activities 5 times a week and weightlifting every 2 days recommended.    Other orders - estradiol (VIVELLE-DOT) 0.075 MG/24HR; Place one patch on the skin once weekly. - estradiol (ESTRACE) 0.1 MG/GM vaginal cream; APPLY 1/4 APPLICATOR VAGINALLY and A THIN APPLICATION TO VULVA TWICE WEEKLY  Norma Lopez, it was a pleasure seeing you today!

## 2020-01-10 NOTE — Progress Notes (Signed)
Norma Lopez 1961/03/16 [id number]   History:    59 y.o. [id number] Married.    RP:  Established patient presenting for annual gyn exam   HPI: S/P Total Hysterectomy.  Menopause on Estradiol 0.075 patch weekly.  Vasomotor menopausal symptoms well controled on that dosage.  No pelvic pain.  Mild dryness with IC.  Pap 01/2018 Negative.  C/O Rectocele with stable bulging in the vagina when passing BMs.  Had 3 SVD.  BMI 33.22 last year, now increased to 37.56.  Baking a lot and not physically active.  Breasts wnl.  Screening Mammo Negative 10/2019.  Health labs with Family MD. Bone Density normal 02/2018.  [place] 2018.  No diverticulitis x 3 yrs.   Past medical history,surgical history, family history and social history were all reviewed and documented in the EPIC chart.  Gynecologic History No LMP recorded. Patient has had a hysterectomy.  Obstetric History OB History  Gravida Para Term Preterm AB Living  4 3     1 3   SAB TAB Ectopic Multiple Live Births               # Outcome Date GA Lbr Len/2nd Weight Sex Delivery Anes PTL Lv  4 AB           3 Para           2 Para           1 Para              ROS: A ROS was performed and pertinent positives and negatives are included in the history.  GENERAL: No fevers or chills. HEENT: No change in vision, no earache, sore throat or sinus congestion. NECK: No pain or stiffness. CARDIOVASCULAR: No chest pain or pressure. No palpitations. PULMONARY: No shortness of breath, cough or wheeze. GASTROINTESTINAL: No abdominal pain, nausea, vomiting or diarrhea, melena or bright red blood per rectum. GENITOURINARY: No urinary frequency, urgency, hesitancy or dysuria. MUSCULOSKELETAL: No joint or muscle pain, no back pain, no recent trauma. DERMATOLOGIC: No rash, no itching, no lesions. ENDOCRINE: No polyuria, polydipsia, no heat or cold intolerance. No recent change in weight. HEMATOLOGICAL: No anemia or easy bruising or bleeding. NEUROLOGIC: No headache,  seizures, numbness, tingling or weakness. PSYCHIATRIC: No depression, no loss of interest in normal activity or change in sleep pattern.     Exam:   BP 124/76   Ht 5' 4.75" (1.645 m)   Wt 224 lb (101.6 kg)   BMI 37.56 kg/m   Body mass index is 37.56 kg/m.  General appearance : Well developed well nourished female. No acute distress HEENT: Eyes: no retinal hemorrhage or exudates,  Neck supple, trachea midline, no carotid bruits, no thyroidmegaly Lungs: Clear to auscultation, no rhonchi or wheezes, or rib retractions  Heart: Regular rate and rhythm, no murmurs or gallops Breast:Examined in sitting and supine position were symmetrical in appearance, no palpable masses or tenderness,  no skin retraction, no nipple inversion, no nipple discharge, no skin discoloration, no axillary or supraclavicular lymphadenopathy Abdomen: no palpable masses or tenderness, no rebound or guarding Extremities: no edema or skin discoloration or tenderness  Pelvic: Vulva: Normal             Vagina: No gross lesions or discharge.  Rectocele grade 2/3.  Cervix/Uterus absent  Adnexa  Without masses or tenderness  Anus: Normal   Assessment/Plan:  59 y.o. female for annual exam   1. Well female exam with routine gynecological exam Gynecologic  exam status post total hysterectomy with BSO.  Pap test in February 2019 was negative, no indication to repeat this year.  Breast exam normal.  Screening mammogram November 2020 was negative.  Colonoscopy in 2018.  Patient has diverticulosis, no diverticulitis for the last 3 years.  Health labs with family physician.  2. H/O total hysterectomy with bilateral salpingo-oophorectomy (BSO)  3. Postmenopausal hormone replacement therapy Well on estradiol patch 0.075 weekly.  No contraindication to continue.  Also using estradiol cream vaginally and on the vulva twice a week.  Both prescriptions sent to pharmacy.  Bone density in March 2019 was normal, will repeat at 5 years.   Vitamin D supplements, calcium intake of 1200 mg daily and regular weightbearing physical activity is recommended.  4. Baden-Walker grade 2 rectocele No progression with only mild symptoms.  No indication for surgery at this time.  Patient reassured.  5. Class 2 severe obesity due to excess calories with serious comorbidity and body mass index (BMI) of 37.0 to 37.9 in adult [facility]) Recommend a low calorie/carb diet such as [employer] or [employer].  Aerobic physical activities 5 times a week and weightlifting every 2 days recommended.    Other orders - estradiol (VIVELLE-DOT) 0.075 MG/24HR; Place one patch on the skin once weekly. - estradiol (ESTRACE) 0.1 MG/GM vaginal cream; APPLY 1/4 APPLICATOR VAGINALLY and A THIN APPLICATION TO VULVA TWICE WEEKLY  Princess Bruins MD, 1:14 PM 01/10/2020

## 2020-01-18 ENCOUNTER — Other Ambulatory Visit: Payer: Self-pay | Admitting: Obstetrics & Gynecology

## 2020-01-18 NOTE — Telephone Encounter (Signed)
appears patient has been using estradiol (climara patch) weekly in past. Per office note patient should be taking weekly patch Rx sent.

## 2020-02-05 ENCOUNTER — Other Ambulatory Visit: Payer: Self-pay | Admitting: Orthopedic Surgery

## 2020-02-15 NOTE — Patient Instructions (Signed)
DUE TO COVID-19 ONLY ONE VISITOR IS ALLOWED TO COME WITH YOU AND STAY IN THE WAITING ROOM ONLY DURING PRE OP AND PROCEDURE DAY OF SURGERY. THE 1 VISITOR MAY VISIT WITH YOU AFTER SURGERY IN YOUR PRIVATE ROOM DURING VISITING HOURS ONLY!  YOU NEED TO HAVE A COVID 19 TEST ON_2/25______ @__2 :45_____, THIS TEST MUST BE DONE BEFORE SURGERY, COME  [address], [place] , [address].  ([place]) ONCE YOUR COVID TEST IS COMPLETED, PLEASE BEGIN THE QUARANTINE INSTRUCTIONS AS OUTLINED IN YOUR HANDOUT.                Norma Lopez    Your procedure is scheduled on: 02/26/20   Report to [facility] Main  Entrance   Report to admitting at  8:15 AM     Call this number if you have problems the morning of surgery [phone]   . BRUSH YOUR TEETH MORNING OF SURGERY AND RINSE YOUR MOUTH OUT, NO CHEWING GUM CANDY OR MINTS.    Do not eat food After Midnight  . YOU MAY HAVE CLEAR LIQUIDS FROM MIDNIGHT UNTIL 7:00 AM.   At 7:00 AM Please finish the prescribed Pre-Surgery Gatorade drink.   Nothing by mouth after you finish the Gatorade drink !   Take these medicines the morning of surgery with A SIP OF WATER: Buspirone, Pristiq, pepcid, use you inhalers and bring them with you to the hospital  DO NOT [place]      How to Manage Your Diabetes Before and After Surgery  Why is it important to control my blood sugar before and after surgery? . Improving blood sugar levels before and after surgery helps healing and can limit problems. . A way of improving blood sugar control is eating a healthy diet by: o  Eating less sugar and carbohydrates o  Increasing activity/exercise o  Talking with your doctor about reaching your blood sugar goals . High blood sugars (greater than 180 mg/dL) can raise your risk of infections and slow your recovery, so you will need to focus on controlling your diabetes during the weeks before surgery. . Make  sure that the doctor who takes care of your diabetes knows about your planned surgery including the date and location.  How do I manage my blood sugar before surgery? . Check your blood sugar at least 4 times a day, starting 2 days before surgery, to make sure that the level is not too high or low. o Check your blood sugar the morning of your surgery when you wake up and every 2 hours until you get to the Short Stay unit. . If your blood sugar is less than 70 mg/dL, you will need to treat for low blood sugar: o Do not take insulin. o Treat a low blood sugar (less than 70 mg/dL) with  cup of clear juice (cranberry or apple), 4 glucose tablets, OR glucose gel. o Recheck blood sugar in 15 minutes after treatment (to make sure it is greater than 70 mg/dL). If your blood sugar is not greater than 70 mg/dL on recheck, call [phone] for further instructions. . Report your blood sugar to the short stay nurse when you get to Short Stay.  . If you are admitted to the hospital after surgery: o Your blood sugar will be checked by the staff and you will probably be given insulin after surgery (instead of oral diabetes medicines) to make sure you have good blood  sugar levels. o The goal for blood sugar control after surgery is 80-180 mg/dL.   WHAT DO I DO ABOUT MY DIABETES MEDICATION?  Marland Kitchen Do not take oral diabetes medicines (pills) the morning of surgery.  .                       You may not have any metal on your body including hair pins and              piercings  Do not wear jewelry, make-up, lotions, powders or perfumes, deodorant             Do not wear nail polish on your fingernails.  Do not shave  48 hours prior to surgery.     Do not bring valuables to the hospital. [place].  Contacts, dentures or bridgework may not be worn into surgery.  .     Patients discharged the day of surgery will not be allowed to drive home  . IF YOU ARE  HAVING SURGERY AND GOING HOME THE SAME DAY, YOU MUST HAVE AN ADULT TO DRIVE YOU HOME AND BE WITH YOU FOR 24 HOURS  . YOU MAY GO HOME BY TAXI OR UBER OR ORTHERWISE, BUT AN ADULT MUST ACCOMPANY YOU HOME AND STAY WITH YOU FOR 24 HOURS.  Name and phone number of your driver:  Special Instructions: N/A              Please read over the following fact sheets you were given: _____________________________________________________________________             [facility] - Preparing for Surgery Before surgery, you can play an important role.   Because skin is not sterile, your skin needs to be as free of germs as possible.   You can reduce the number of germs on your skin by washing with CHG (chlorahexidine gluconate) soap before surgery.   CHG is an antiseptic cleaner which kills germs and bonds with the skin to continue killing germs even after washing. Please DO NOT use if you have an allergy to CHG or antibacterial soaps.   If your skin becomes reddened/irritated stop using the CHG and inform your nurse when you arrive at Short Stay. Do not shave (including legs and underarms) for at least 48 hours prior to the first CHG shower.   Please follow these instructions carefully:  1.  Shower with CHG Soap the night before surgery and the  morning of Surgery.  2.  If you choose to wash your hair, wash your hair first as usual with your  normal  shampoo.  3.  After you shampoo, rinse your hair and body thoroughly to remove the  shampoo.                                        4.  Use CHG as you would any other liquid soap.  You can apply chg directly  to the skin and wash                       Gently with a scrungie or clean washcloth.  5.  Apply the CHG Soap to your body ONLY FROM THE NECK DOWN.   Do not use on face/ open  Wound or open sores. Avoid contact with eyes, ears mouth and genitals (private parts).                       Wash face,  Genitals (private parts) with your  normal soap.             6.  Wash thoroughly, paying special attention to the area where your surgery  will be performed.  7.  Thoroughly rinse your body with warm water from the neck down.  8.  DO NOT shower/wash with your normal soap after using and rinsing off  the CHG Soap.             9.  Pat yourself dry with a clean towel.            10.  Wear clean pajamas.            11.  Place clean sheets on your bed the night of your first shower and do not  sleep with pets. Day of Surgery : Do not apply any lotions/deodorants the morning of surgery.  Please wear clean clothes to the hospital/surgery center.  FAILURE TO FOLLOW THESE INSTRUCTIONS MAY RESULT IN THE CANCELLATION OF YOUR SURGERY PATIENT SIGNATURE_________________________________  NURSE SIGNATURE__________________________________  ________________________________________________________________________

## 2020-02-16 ENCOUNTER — Encounter (HOSPITAL_COMMUNITY)
Admission: RE | Admit: 2020-02-16 | Discharge: 2020-02-16 | Disposition: A | Discharge status: Home / Self Care | Payer: BC Managed Care – PPO | Source: Ambulatory Visit | Attending: Orthopedic Surgery | Admitting: Orthopedic Surgery

## 2020-02-16 ENCOUNTER — Other Ambulatory Visit: Payer: Self-pay

## 2020-02-16 ENCOUNTER — Encounter (HOSPITAL_COMMUNITY): Payer: Self-pay

## 2020-02-16 ENCOUNTER — Ambulatory Visit: Payer: BC Managed Care – PPO | Admitting: Cardiology

## 2020-02-16 DIAGNOSIS — R9431 Abnormal electrocardiogram [ECG] [EKG]: Secondary | ICD-10-CM | POA: Diagnosis not present

## 2020-02-16 DIAGNOSIS — Z9981 Dependence on supplemental oxygen: Secondary | ICD-10-CM | POA: Insufficient documentation

## 2020-02-16 DIAGNOSIS — D860 Sarcoidosis of lung: Secondary | ICD-10-CM | POA: Diagnosis not present

## 2020-02-16 DIAGNOSIS — Z800 Family history of malignant neoplasm of digestive organs: Secondary | ICD-10-CM | POA: Insufficient documentation

## 2020-02-16 DIAGNOSIS — E1140 Type 2 diabetes mellitus with diabetic neuropathy, unspecified: Secondary | ICD-10-CM | POA: Diagnosis not present

## 2020-02-16 DIAGNOSIS — Z01818 Encounter for other preprocedural examination: Secondary | ICD-10-CM | POA: Diagnosis not present

## 2020-02-16 DIAGNOSIS — Z6837 Body mass index (BMI) 37.0-37.9, adult: Secondary | ICD-10-CM | POA: Insufficient documentation

## 2020-02-16 DIAGNOSIS — J4530 Mild persistent asthma, uncomplicated: Secondary | ICD-10-CM | POA: Diagnosis not present

## 2020-02-16 DIAGNOSIS — Z79899 Other long term (current) drug therapy: Secondary | ICD-10-CM | POA: Diagnosis not present

## 2020-02-16 DIAGNOSIS — E785 Hyperlipidemia, unspecified: Secondary | ICD-10-CM | POA: Insufficient documentation

## 2020-02-16 DIAGNOSIS — X58XXXA Exposure to other specified factors, initial encounter: Secondary | ICD-10-CM | POA: Diagnosis not present

## 2020-02-16 DIAGNOSIS — Z87891 Personal history of nicotine dependence: Secondary | ICD-10-CM | POA: Diagnosis not present

## 2020-02-16 DIAGNOSIS — Z7984 Long term (current) use of oral hypoglycemic drugs: Secondary | ICD-10-CM | POA: Diagnosis not present

## 2020-02-16 DIAGNOSIS — E669 Obesity, unspecified: Secondary | ICD-10-CM | POA: Insufficient documentation

## 2020-02-16 DIAGNOSIS — F4310 Post-traumatic stress disorder, unspecified: Secondary | ICD-10-CM | POA: Diagnosis not present

## 2020-02-16 DIAGNOSIS — K589 Irritable bowel syndrome without diarrhea: Secondary | ICD-10-CM | POA: Diagnosis not present

## 2020-02-16 DIAGNOSIS — G4730 Sleep apnea, unspecified: Secondary | ICD-10-CM | POA: Insufficient documentation

## 2020-02-16 DIAGNOSIS — K219 Gastro-esophageal reflux disease without esophagitis: Secondary | ICD-10-CM | POA: Diagnosis not present

## 2020-02-16 DIAGNOSIS — S83242A Other tear of medial meniscus, current injury, left knee, initial encounter: Secondary | ICD-10-CM | POA: Insufficient documentation

## 2020-02-16 DIAGNOSIS — J449 Chronic obstructive pulmonary disease, unspecified: Secondary | ICD-10-CM | POA: Diagnosis not present

## 2020-02-16 HISTORY — DX: Myoneural disorder, unspecified: G70.9

## 2020-02-16 HISTORY — DX: Other specified postprocedural states: R11.2

## 2020-02-16 HISTORY — DX: Nausea with vomiting, unspecified: Z98.890

## 2020-02-16 LAB — CBC
HCT: 43.9 % (ref 36.0–46.0)
Hemoglobin: 14.4 g/dL (ref 12.0–15.0)
MCH: 29.6 pg (ref 26.0–34.0)
MCHC: 32.8 g/dL (ref 30.0–36.0)
MCV: 90.3 fL (ref 80.0–100.0)
Platelets: 286 10*3/uL (ref 150–400)
RBC: 4.86 MIL/uL (ref 3.87–5.11)
RDW: 12.4 % (ref 11.5–15.5)
WBC: 4.7 10*3/uL (ref 4.0–10.5)
nRBC: 0 % (ref 0.0–0.2)

## 2020-02-16 LAB — HEMOGLOBIN A1C
Hgb A1c MFr Bld: 6.2 % — ABNORMAL HIGH (ref 4.8–5.6)
Mean Plasma Glucose: 131.24 mg/dL

## 2020-02-16 LAB — BASIC METABOLIC PANEL
Anion gap: 7 (ref 5–15)
BUN: 18 mg/dL (ref 6–20)
CO2: 28 mmol/L (ref 22–32)
Calcium: 9.2 mg/dL (ref 8.9–10.3)
Chloride: 104 mmol/L (ref 98–111)
Creatinine, Ser: 0.74 mg/dL (ref 0.44–1.00)
GFR calc Af Amer: >60 mL/min (ref >60)
GFR calc non Af Amer: >60 mL/min (ref >60)
Glucose, Bld: 96 mg/dL (ref 70–99)
Potassium: 3.9 mmol/L (ref 3.5–5.1)
Sodium: 139 mmol/L (ref 135–145)

## 2020-02-16 LAB — GLUCOSE, CAPILLARY: Glucose-Capillary: 89 mg/dL (ref 70–99)

## 2020-02-16 NOTE — Progress Notes (Signed)
PCP -Dr. Margo Aye Cardiologist -Dr. Nelta Numbers   Chest x-ray - 08/18/19 EKG - 02/16/20 Stress Test - no ECHO -no  Cardiac Cath - no  Sleep Study - yes CPAP - yes wit 1-2-L O2 PRN  Fasting Blood Sugar - 98-156 Checks Blood Sugar _QD____ times a day  Blood Thinner Instructions:NA Aspirin Instructions: Last Dose:  Anesthesia review:   Patient denies shortness of breath, fever, cough and chest pain at PAT appointment Pt is having a flare up of her sarcadosis since 10/20 and has some SOB  Patient verbalized understanding of instructions that were given to them at the PAT appointment. Patient was also instructed that they will need to review over the PAT instructions again at home before surgery. yes

## 2020-02-19 NOTE — Anesthesia Preprocedure Evaluation (Addendum)
Anesthesia Evaluation  Patient identified by MRN, date of birth, ID band Patient awake    Reviewed: Allergy & Precautions, NPO status , Patient's Chart, lab work & pertinent test results, reviewed documented beta blocker date and time   History of Anesthesia Complications (+) PONV and history of anesthetic complications  Airway Mallampati: II  TM Distance: >3 FB Neck ROM: Full    Dental no notable dental hx.    Pulmonary asthma , sleep apnea and Continuous Positive Airway Pressure Ventilation , COPD,  COPD inhaler and oxygen dependent, former smoker,    Pulmonary exam normal        Cardiovascular hypertension, Pt. on medications and Pt. on home beta blockers Normal cardiovascular exam  Negative stress echo 2019   Neuro/Psych  Headaches, Anxiety Depression    GI/Hepatic Neg liver ROS, hiatal hernia, GERD  Medicated and Poorly Controlled,  Endo/Other  diabetes, Type 2, Oral Hypoglycemic AgentsBMI 37  Renal/GU negative Renal ROS  negative genitourinary   Musculoskeletal negative musculoskeletal ROS (+)   Abdominal   Peds  Hematology negative hematology ROS (+)   Anesthesia Other Findings Day of surgery medications reviewed with patient.  Reproductive/Obstetrics negative OB ROS                          Anesthesia Physical Anesthesia Plan  ASA: III  Anesthesia Plan: General   Post-op Pain Management:    Induction: Intravenous, Rapid sequence and Cricoid pressure planned  PONV Risk Score and Plan: 4 or greater and Treatment may vary due to age or medical condition, Ondansetron, Dexamethasone, Propofol infusion, TIVA and Midazolam  Airway Management Planned: Oral ETT  Additional Equipment: None  Intra-op Plan:   Post-operative Plan: Extubation in OR  Informed Consent: I have reviewed the patients History and Physical, chart, labs and discussed the procedure including the risks, benefits  and alternatives for the proposed anesthesia with the patient or authorized representative who has indicated his/her understanding and acceptance.     Dental advisory given  Plan Discussed with: CRNA  Anesthesia Plan Comments: (See PAT note 02/16/2020, Konrad Felix, PA-C)      Anesthesia Quick Evaluation

## 2020-02-19 NOTE — Progress Notes (Signed)
Anesthesia Chart Review   Case: [id number] Date/Time: 02/26/20 1000   Procedure: KNEE ARTHROSCOPY WITH MEDIAL MENISECTOMY (Left Knee)   Anesthesia type: Choice   Pre-op diagnosis: Torn left medial meniscus   Location: [facility] ROOM 05 / [facility]   Surgeons: Vickey Huger, MD      DISCUSSION:58 y.o. former smoker (12/28/96) with h/o PONV, PTSD, HLD, sleep apnea (requires O2 at night), GERD, COPD, sarcoidosis of the lung, mild to moderate persistent asthma, DM II, torn left medial meniscus scheduled for above procedure 02/26/20 with Dr. Vickey Huger.   Pt last seen by pulmonologist, Dr. Camillo Flaming, 02/02/2020, OSA, asthma stable at this visit. 1 year follow up recommended.   Sarcoidosis followed by Dr. Julious Payer.  Last seen 01/30/20.  Per OV note, "Has multiple chronic knee injuries and has been recommended that she undergo surgery which she has been putting off because of Covid.  We discussed the risk of getting Covid would be relatively minimal and I think any ability to lose weight and improve her exercise tolerance would be of great benefit to her.  From a pulmonary standpoint I think she would do fine with general anesthesia if required.  She does have a history of sleep apnea so I would encourage the use of CPAP after extubation until she is awake".   Pt stable at PAT visit.  Lungs clear to auscultation bilaterally.  Advised to bring inhaler.   Anticipate pt can proceed with planned procedure barring acute status change and after evaluation DOS.  VS: BP (!) 142/78 (BP Location: Right Arm)   Pulse 75   Temp 37.1 C (Oral)   Resp 18   Ht 5\' 6"  (1.676 m)   Wt 104.3 kg   SpO2 99%   BMI 37.12 kg/m   PROVIDERS: Spry, Marsh Dolly., MD is PCP   Jenne Campus, MD is Cardiologist   Charlaine Dalton, MD is Pulmonologist (follows asthma and OSA)  Timmie Foerster, MD is Pulmonologist (follows sarcoidosis) LABS: Labs reviewed: Acceptable for surgery. (all labs ordered are listed, but only  abnormal results are displayed)  Labs Reviewed  HEMOGLOBIN A1C - Abnormal; Notable for the following components:      Result Value   Hgb A1c MFr Bld 6.2 (*)    All other components within normal limits  BASIC METABOLIC PANEL  CBC  GLUCOSE, CAPILLARY     IMAGES:   EKG: 02/16/20 Rate 75 bpm Normal sinus rhythm  Low voltage QRS Cannot rule out anterior infarct, age undetermined  CV: Echo Stress Test 09/09/18 Study Conclusions   - Stress ECG conclusions: There were no stress arrhythmias or  conduction abnormalities. The stress ECG was negative for  ischemia.  - Staged echo: There was no echocardiographic evidence for  stress-induced ischemia.   Impressions:   - Good exercise tolerance  No chest pain.  No evidence of ischemia base on ECG and echo criteria.  Overall it is NEGATIVE stress test for exercise induced ischemia.  Past Medical History:  Diagnosis Date  . ADD (attention deficit disorder with hyperactivity)   . Allergic rhinitis   . Allergy   . Anxiety and depression   . Asthma   . COPD (chronic obstructive pulmonary disease) ([place])   . Diabetes ([place])   . Diarrhea, functional   . Diverticulosis   . Dysrhythmia 2015   none since 2015  . Family history of colon cancer   . Fibrosis of lung ([place])   . GERD (gastroesophageal reflux disease)   .  History of pneumoconiosis    with ILD with nocturnal hypoxemia on 02 2 l/min  . Hyperlipidemia   . IBS (irritable bowel syndrome)   . Migraines   . Multiple lung nodules on CT   . Neuromuscular disorder (HCC)    neuropathy feet  . Obesity   . Oropharyngeal dysphagia   . Panic disorder   . PONV (postoperative nausea and vomiting)   . PTSD (post-traumatic stress disorder)   . Rectal incontinence   . Restless leg syndrome   . Sarcoidosis   . Sleep apnea    patient reports using CPAP machine at night  . Thyroid nodule     Past Surgical History:  Procedure Laterality Date  . BREAST EXCISIONAL BIOPSY  Left 1985, 2006   Benign  . CHOLECYSTECTOMY    . COLONOSCOPY  06/23/2017   Colonic polyp status post polypectomy. Pancolonic diverticulosis predominantly in the sigmoid colon'  . ESOPHAGOGASTRODUODENOSCOPY  07/25/2008   Healed esophageal erosions. Irregular Z-line suggestive of gastroesophageal reflux (biopsied to rule out short-segment Barrett's esophagus) Mild gastritis.  Marland Kitchen KNEE ARTHROSCOPY     x3  . TONSILLECTOMY    . TOTAL ABDOMINAL HYSTERECTOMY  1992  . TUBAL LIGATION    . veins stripped  Left 1989    MEDICATIONS: . acetaminophen (TYLENOL) 500 MG tablet  . albuterol (ACCUNEB) 0.63 MG/3ML nebulizer solution  . albuterol (PROVENTIL HFA;VENTOLIN HFA) 108 (90 BASE) MCG/ACT inhaler  . Armodafinil 250 MG tablet  . AUVI-Q 0.3 MG/0.3ML SOAJ injection  . busPIRone (BUSPAR) 10 MG tablet  . Carboxymethylcellulose Sod PF (REFRESH PLUS) 0.5 % SOLN  . Cholecalciferol (VITAMIN D-3) 125 MCG (5000 UT) TABS  . desvenlafaxine (PRISTIQ) 100 MG 24 hr tablet  . dexlansoprazole (DEXILANT) 60 MG capsule  . diclofenac Sodium (VOLTAREN) 1 % GEL  . diphenhydrAMINE (BENADRYL) 25 MG tablet  . docusate sodium (COLACE) 100 MG capsule  . ELDERBERRY PO  . EQUETRO 100 MG CP12 12 hr capsule  . EQUETRO 300 MG CP12  . estradiol (CLIMARA - DOSED IN MG/24 HR) 0.075 mg/24hr patch  . estradiol (ESTRACE) 0.1 MG/GM vaginal cream  . famotidine (PEPCID) 20 MG tablet  . Fluticasone Furoate 100 MCG/ACT AEPB  . Guaifenesin 1200 MG TB12  . L-Methylfolate 15 MG TABS  . lisinopril (ZESTRIL) 10 MG tablet  . loratadine (CLARITIN) 10 MG tablet  . magnesium oxide (MAG-OX) 400 MG tablet  . Menthol, Topical Analgesic, (BIOFREEZE EX)  . metFORMIN (GLUCOPHAGE) 500 MG tablet  . methotrexate 50 MG/2ML injection  . metoprolol succinate (TOPROL-XL) 25 MG 24 hr tablet  . metoprolol tartrate (LOPRESSOR) 25 MG tablet  . nystatin (MYCOSTATIN/NYSTOP) powder  . Omega-3 Fatty Acids (FISH OIL ULTRA) 1400 MG CAPS  . ondansetron  (ZOFRAN) 4 MG tablet  . OXYGEN  . pramipexole (MIRAPEX) 1 MG tablet  . Probiotic Product (DIGESTIVE ADVANTAGE PO)  . QUNOL COQ10/UBIQUINOL/MEGA 100 MG CAPS  . rosuvastatin (CRESTOR) 10 MG tablet  . sodium chloride (OCEAN) 0.65 % SOLN nasal spray  . TURMERIC PO   No current facility-administered medications for this encounter.    Maia Plan [facility] Pre-Surgical Testing [phone] 02/19/20  2:53 PM

## 2020-02-20 ENCOUNTER — Telehealth (INDEPENDENT_AMBULATORY_CARE_PROVIDER_SITE_OTHER): Payer: BC Managed Care – PPO | Admitting: Cardiology

## 2020-02-20 ENCOUNTER — Encounter: Payer: Self-pay | Admitting: Cardiology

## 2020-02-20 VITALS — BP 132/83 | HR 71 | Temp 97.8°F | Ht 66.0 in | Wt 228.0 lb

## 2020-02-20 DIAGNOSIS — I493 Ventricular premature depolarization: Secondary | ICD-10-CM

## 2020-02-20 DIAGNOSIS — Z01810 Encounter for preprocedural cardiovascular examination: Secondary | ICD-10-CM

## 2020-02-20 HISTORY — DX: Encounter for preprocedural cardiovascular examination: Z01.810

## 2020-02-20 NOTE — Progress Notes (Signed)
Virtual Visit via Video Note   This visit type was conducted due to national recommendations for restrictions regarding the COVID-19 Pandemic (e.g. social distancing) in an effort to limit this patient's exposure and mitigate transmission in our community.  Due to her co-morbid illnesses, this patient is at least at moderate risk for complications without adequate follow up.  This format is felt to be most appropriate for this patient at this time.  All issues noted in this document were discussed and addressed.  A limited physical exam was performed with this format.  Please refer to the patient's chart for her consent to telehealth for [facility].  Evaluation Performed:  Follow-up visit  This visit type was conducted due to national recommendations for restrictions regarding the COVID-19 Pandemic (e.g. social distancing).  This format is felt to be most appropriate for this patient at this time.  All issues noted in this document were discussed and addressed.  No physical exam was performed (except for noted visual exam findings with Video Visits).  Please refer to the patient's chart (MyChart message for video visits and phone note for telephone visits) for the patient's consent to telehealth for [facility].  Date:  02/20/2020  ID: Norma Lopez, DOB 07/22/1961, MRN [id number]   Patient Location: [place]  [id number]   Provider location:   [place] Office  PCP:  Verdell Carmine., MD  Cardiologist:  Jenne Campus, MD     Chief Complaint: I need to left knee surgery  History of Present Illness:    Norma Lopez is a 59 y.o. female  who presents via audio/video conferencing for a telehealth visit today.  With past medical history significant for palpitations in form of PVCs, essential hypertension, diabetes mellitus comes today to my office for follow-up.  Overall his she seems to be doing well she does required left knee surgery.  Cannot be done  and her most likely due to spinal general esthesia.  Cardiac wise doing well.  Denies have any chest pain tightness squeezing pressure burning chest she admits that should be secondary since injuring of her knee.  That happened about 3 months ago until then she was able to walk climb stairs with no difficulties.  We did have a stress test done in September 2019 which was normal.  Denies having any palpitations no dizziness no passing out   The patient does not have symptoms concerning for COVID-19 infection (fever, chills, cough, or new SHORTNESS OF BREATH).    Prior CV studies:   The following studies were reviewed today:  Stress test from 2019 reviewed it with stress echocardiogram good exercise tolerance and stress test was negative     Past Medical History:  Diagnosis Date  . ADD (attention deficit disorder with hyperactivity)   . Allergic rhinitis   . Allergy   . Anxiety and depression   . Asthma   . COPD (chronic obstructive pulmonary disease) ([place])   . Diabetes ([place])   . Diarrhea, functional   . Diverticulosis   . Dysrhythmia 2015   none since 2015  . Family history of colon cancer   . Fibrosis of lung ([place])   . GERD (gastroesophageal reflux disease)   . History of pneumoconiosis    with ILD with nocturnal hypoxemia on 02 2 l/min  . Hyperlipidemia   . IBS (irritable bowel syndrome)   . Migraines   . Multiple lung nodules on CT   . Neuromuscular disorder (  HCC)    neuropathy feet  . Obesity   . Oropharyngeal dysphagia   . Panic disorder   . PONV (postoperative nausea and vomiting)   . PTSD (post-traumatic stress disorder)   . Rectal incontinence   . Restless leg syndrome   . Sarcoidosis   . Sleep apnea    patient reports using CPAP machine at night  . Thyroid nodule     Past Surgical History:  Procedure Laterality Date  . BREAST EXCISIONAL BIOPSY Left 1985, 2006   Benign  . CHOLECYSTECTOMY    . COLONOSCOPY  06/23/2017   Colonic polyp status post  polypectomy. Pancolonic diverticulosis predominantly in the sigmoid colon'  . ESOPHAGOGASTRODUODENOSCOPY  07/25/2008   Healed esophageal erosions. Irregular Z-line suggestive of gastroesophageal reflux (biopsied to rule out short-segment Barrett's esophagus) Mild gastritis.  Marland Kitchen KNEE ARTHROSCOPY     x3  . TONSILLECTOMY    . TOTAL ABDOMINAL HYSTERECTOMY  1992  . TUBAL LIGATION    . veins stripped  Left 1989     Current Meds  Medication Sig  . acetaminophen (TYLENOL) 500 MG tablet Take 500-1,000 mg by mouth every 6 (six) hours as needed (for pain.).  Marland Kitchen albuterol (ACCUNEB) 0.63 MG/3ML nebulizer solution Take 0.63 mg by nebulization every 6 (six) hours as needed for wheezing.  Marland Kitchen albuterol (PROVENTIL HFA;VENTOLIN HFA) 108 (90 BASE) MCG/ACT inhaler Inhale 2 puffs into the lungs every 6 (six) hours as needed for wheezing or shortness of breath.   . Armodafinil 250 MG tablet Take 250 mg by mouth daily.  Marland Kitchen AUVI-Q 0.3 MG/0.3ML SOAJ injection Use as directed for life-threatening allergic reaction.  . busPIRone (BUSPAR) 10 MG tablet Take 15 mg by mouth in the morning and at bedtime.  . Carboxymethylcellulose Sod PF (REFRESH PLUS) 0.5 % SOLN Place 1 drop into both eyes at bedtime.  . Cholecalciferol (VITAMIN D-3) 125 MCG (5000 UT) TABS Take 5,000 Units by mouth daily.  Marland Kitchen desvenlafaxine (PRISTIQ) 100 MG 24 hr tablet Take 1 tablet (100 mg total) by mouth daily.  Marland Kitchen dexlansoprazole (DEXILANT) 60 MG capsule Take 1 capsule (60 mg total) by mouth daily. (Patient taking differently: Take 60 mg by mouth at bedtime. )  . diclofenac Sodium (VOLTAREN) 1 % GEL Apply 1 application topically 4 (four) times daily as needed (knee pain.).  Marland Kitchen diphenhydrAMINE (BENADRYL) 25 MG tablet Take 25 mg by mouth every 6 (six) hours as needed (allergic reactions.).  Marland Kitchen docusate sodium (COLACE) 100 MG capsule Take 100 mg by mouth 2 (two) times daily as needed (constipation.).  Marland Kitchen ELDERBERRY PO Take 1 tablet by mouth daily. (Sambucol)  With C & Zinc  . EQUETRO 100 MG CP12 12 hr capsule Take 100 mg by mouth at bedtime.  . EQUETRO 300 MG CP12 Take 1 capsule by mouth at bedtime.  Marland Kitchen estradiol (CLIMARA - DOSED IN MG/24 HR) 0.075 mg/24hr patch Place 1 patch (0.075 mg total) onto the skin once a week. Please specify directions, refills and quantity (Patient taking differently: Place 0.075 mg onto the skin every 14 (fourteen) days. Please specify directions, refills and quantity)  . estradiol (ESTRACE) 0.1 MG/GM vaginal cream APPLY 1/4 APPLICATOR VAGINALLY and A THIN APPLICATION TO VULVA TWICE WEEKLY (Patient taking differently: Place [id number] Applicatorfuls vaginally See admin instructions. APPLY 1/4 APPLICATOR VAGINALLY and A THIN APPLICATION TO VULVA TWICE WEEKLY)  . famotidine (PEPCID) 20 MG tablet Take 1 tablet (20 mg total) by mouth daily.  . Fluticasone Furoate 100 MCG/ACT AEPB Inhale 1 puff into  the lungs at bedtime. ARNUITY ELLIPTA  . Guaifenesin 1200 MG TB12 Take 1,200 mg by mouth 2 (two) times daily as needed (congestion).  Marland Kitchen L-Methylfolate 15 MG TABS Take 15 mg by mouth at bedtime.   Marland Kitchen lisinopril (ZESTRIL) 10 MG tablet Take 10 mg by mouth at bedtime.   Marland Kitchen loratadine (CLARITIN) 10 MG tablet Take 10 mg by mouth at bedtime.  . magnesium oxide (MAG-OX) 400 MG tablet Take 400 mg by mouth 3 (three) times a week. Sundays, Tuesdays, Thursdays.  . Menthol, Topical Analgesic, (BIOFREEZE EX) Apply 1 application topically 4 (four) times daily as needed (pain.).  Marland Kitchen metFORMIN (GLUCOPHAGE) 500 MG tablet Take 500 mg by mouth in the morning and at bedtime.   . methotrexate 50 MG/2ML injection Inject 7.5 mg into the skin every Sunday.   . metoprolol succinate (TOPROL-XL) 25 MG 24 hr tablet Take 25 mg by mouth at bedtime.  . metoprolol tartrate (LOPRESSOR) 25 MG tablet TAKE 1 TABLET BY MOUTH TWICE A DAY  . nystatin (MYCOSTATIN/NYSTOP) powder Apply 1 application topically as needed (yeast (skin folds)).   . Omega-3 Fatty Acids (FISH OIL ULTRA) 1400  MG CAPS Take 1,400 mg by mouth daily.  . ondansetron (ZOFRAN) 4 MG tablet Take 4 mg by mouth every 8 (eight) hours as needed for nausea or vomiting.  . OXYGEN Inhale 2 L into the lungs at bedtime.   . pramipexole (MIRAPEX) 1 MG tablet Take 1 mg by mouth at bedtime.  . Probiotic Product (DIGESTIVE ADVANTAGE PO) Take 1 capsule by mouth daily.  [place] Breath COQ10/UBIQUINOL/MEGA 100 MG CAPS Take 100 mg by mouth daily.  . rosuvastatin (CRESTOR) 10 MG tablet Take 10 mg by mouth every Monday, Wednesday, and Friday at 8 PM.   . sodium chloride (OCEAN) 0.65 % SOLN nasal spray Place 1 spray into both nostrils at bedtime.  . TURMERIC PO Take 1 capsule by mouth daily.      Family History: The patient's family history includes Alcohol abuse in her father; Allergies in an other family member; Anxiety disorder in her mother and another family member; Arthritis in her mother and another family member; Asthma in her sister and sister; Breast cancer (age of onset: 63) in her sister; COPD in her father and mother; Cancer in her father; Cancer (age of onset: 30) in her sister; Depression in her mother; Diabetes in her father and sister; Heart attack in her father; Heart disease in her father; Hyperlipidemia in an other family member; Obesity in an other family member; Sleep apnea in her sister.   ROS:   Please see the history of present illness.     All other systems reviewed and are negative.   Labs/Other Tests and Data Reviewed:     Recent Labs: 02/16/2020: BUN 18; Creatinine, Ser 0.74; Hemoglobin 14.4; Platelets 286; Potassium 3.9; Sodium 139  Recent Lipid Panel No results found for: CHOL, TRIG, HDL, CHOLHDL, VLDL, LDLCALC, LDLDIRECT    Exam:    Vital Signs:  BP 132/83   Pulse 71   Temp 97.8 F (36.6 C)   Ht 5\' 6"  (1.676 m)   Wt 228 lb (103.4 kg)   SpO2 95%   BMI 36.80 kg/m     Wt Readings from Last 3 Encounters:  02/20/20 228 lb (103.4 kg)  02/16/20 230 lb (104.3 kg)  01/10/20 224 lb (101.6  kg)     Well nourished, well developed in no acute distress. Alert awake and attentive we asymptomatic sitting at home  and I am in our office in [facility].  Diagnosis for this visit:   1. Ventricular premature depolarization   2. Dyspnea on exertion   3. Palpitations      ASSESSMENT & PLAN:    1.  PVCs.  Managed with beta-blocker which I will continue. 2.  Dyspnea on exertion stable continue present management. 3.  Palpitations not bothersome. 4.  Cardiovascular preop evaluation.  Before her injury of her knee which was 2 months ago she was able to walk climb stairs with no difficulties.  Even now she said she is able to climb stairs with no problems.  Therefore, I think she is fine to proceed with surgery as scheduled.  COVID-19 Education: The signs and symptoms of COVID-19 were discussed with the patient and how to seek care for testing (follow up with PCP or arrange E-visit).  The importance of social distancing was discussed today.  Patient Risk:   After full review of this patients clinical status, I feel that they are at least moderate risk at this time.  Time:   Today, I have spent 5 minutes with the patient with telehealth technology discussing pt health issues.  I spent 15 minutes reviewing her chart before the visit.  Visit was finished at 2:51 PM.    Medication Adjustments/Labs and Tests Ordered: Current medicines are reviewed at length with the patient today.  Concerns regarding medicines are outlined above.  No orders of the defined types were placed in this encounter.  Medication changes: No orders of the defined types were placed in this encounter.    Disposition: Follow-up in 5 months  Signed, Park Liter, MD, [facility] 02/20/2020 2:49 PM    [place]

## 2020-02-22 ENCOUNTER — Inpatient Hospital Stay (HOSPITAL_COMMUNITY): Admission: RE | Admit: 2020-02-22 | Payer: BC Managed Care – PPO | Source: Ambulatory Visit

## 2020-02-23 ENCOUNTER — Other Ambulatory Visit (HOSPITAL_COMMUNITY)
Admission: RE | Admit: 2020-02-23 | Discharge: 2020-02-23 | Disposition: A | Discharge status: Home / Self Care | Payer: BC Managed Care – PPO | Source: Ambulatory Visit | Attending: Orthopedic Surgery | Admitting: Orthopedic Surgery

## 2020-02-23 DIAGNOSIS — Z01812 Encounter for preprocedural laboratory examination: Secondary | ICD-10-CM | POA: Diagnosis not present

## 2020-02-23 DIAGNOSIS — Z20822 Contact with and (suspected) exposure to covid-19: Secondary | ICD-10-CM | POA: Diagnosis not present

## 2020-02-23 LAB — SARS CORONAVIRUS 2 (TAT 6-24 HRS): SARS Coronavirus 2: NEGATIVE

## 2020-02-26 ENCOUNTER — Ambulatory Visit (HOSPITAL_COMMUNITY): Payer: BC Managed Care – PPO | Admitting: Anesthesiology

## 2020-02-26 ENCOUNTER — Encounter (HOSPITAL_COMMUNITY)
Admission: RE | Disposition: A | Discharge status: Home / Self Care | Payer: Self-pay | Source: Ambulatory Visit | Attending: Orthopedic Surgery

## 2020-02-26 ENCOUNTER — Encounter (HOSPITAL_COMMUNITY): Payer: Self-pay | Admitting: Orthopedic Surgery

## 2020-02-26 ENCOUNTER — Ambulatory Visit (HOSPITAL_COMMUNITY): Payer: BC Managed Care – PPO | Admitting: Physician Assistant

## 2020-02-26 ENCOUNTER — Ambulatory Visit (HOSPITAL_COMMUNITY)
Admission: RE | Admit: 2020-02-26 | Discharge: 2020-02-26 | Disposition: A | Discharge status: Home / Self Care | Payer: BC Managed Care – PPO | Source: Ambulatory Visit | Attending: Orthopedic Surgery | Admitting: Orthopedic Surgery

## 2020-02-26 DIAGNOSIS — M94262 Chondromalacia, left knee: Secondary | ICD-10-CM | POA: Diagnosis not present

## 2020-02-26 DIAGNOSIS — X58XXXA Exposure to other specified factors, initial encounter: Secondary | ICD-10-CM | POA: Diagnosis not present

## 2020-02-26 DIAGNOSIS — J449 Chronic obstructive pulmonary disease, unspecified: Secondary | ICD-10-CM | POA: Insufficient documentation

## 2020-02-26 DIAGNOSIS — Z7951 Long term (current) use of inhaled steroids: Secondary | ICD-10-CM | POA: Diagnosis not present

## 2020-02-26 DIAGNOSIS — I10 Essential (primary) hypertension: Secondary | ICD-10-CM | POA: Insufficient documentation

## 2020-02-26 DIAGNOSIS — G4730 Sleep apnea, unspecified: Secondary | ICD-10-CM | POA: Diagnosis not present

## 2020-02-26 DIAGNOSIS — M1712 Unilateral primary osteoarthritis, left knee: Secondary | ICD-10-CM | POA: Diagnosis not present

## 2020-02-26 DIAGNOSIS — Z9981 Dependence on supplemental oxygen: Secondary | ICD-10-CM | POA: Insufficient documentation

## 2020-02-26 DIAGNOSIS — E785 Hyperlipidemia, unspecified: Secondary | ICD-10-CM | POA: Diagnosis not present

## 2020-02-26 DIAGNOSIS — Z79899 Other long term (current) drug therapy: Secondary | ICD-10-CM | POA: Insufficient documentation

## 2020-02-26 DIAGNOSIS — F329 Major depressive disorder, single episode, unspecified: Secondary | ICD-10-CM | POA: Insufficient documentation

## 2020-02-26 DIAGNOSIS — E559 Vitamin D deficiency, unspecified: Secondary | ICD-10-CM | POA: Diagnosis not present

## 2020-02-26 DIAGNOSIS — S83242A Other tear of medial meniscus, current injury, left knee, initial encounter: Secondary | ICD-10-CM | POA: Insufficient documentation

## 2020-02-26 DIAGNOSIS — S83282A Other tear of lateral meniscus, current injury, left knee, initial encounter: Secondary | ICD-10-CM | POA: Diagnosis not present

## 2020-02-26 DIAGNOSIS — K219 Gastro-esophageal reflux disease without esophagitis: Secondary | ICD-10-CM | POA: Insufficient documentation

## 2020-02-26 DIAGNOSIS — F419 Anxiety disorder, unspecified: Secondary | ICD-10-CM | POA: Insufficient documentation

## 2020-02-26 DIAGNOSIS — Z7984 Long term (current) use of oral hypoglycemic drugs: Secondary | ICD-10-CM | POA: Insufficient documentation

## 2020-02-26 DIAGNOSIS — E1136 Type 2 diabetes mellitus with diabetic cataract: Secondary | ICD-10-CM | POA: Diagnosis not present

## 2020-02-26 DIAGNOSIS — Z87891 Personal history of nicotine dependence: Secondary | ICD-10-CM | POA: Insufficient documentation

## 2020-02-26 DIAGNOSIS — M25562 Pain in left knee: Secondary | ICD-10-CM | POA: Diagnosis present

## 2020-02-26 HISTORY — PX: KNEE ARTHROSCOPY WITH MEDIAL MENISECTOMY: SHX5651

## 2020-02-26 LAB — GLUCOSE, CAPILLARY
Glucose-Capillary: 105 mg/dL — ABNORMAL HIGH (ref 70–99)
Glucose-Capillary: 99 mg/dL (ref 70–99)

## 2020-02-26 SURGERY — ARTHROSCOPY, KNEE, WITH MEDIAL MENISCECTOMY
Anesthesia: General | Site: Knee | Laterality: Left

## 2020-02-26 MED ORDER — HYDROCODONE-ACETAMINOPHEN 5-325 MG PO TABS: 1.0000 | ORAL_TABLET | Freq: Four times a day (QID) | ORAL | 0 refills | Status: DC | PRN

## 2020-02-26 MED ORDER — HYDROCODONE-ACETAMINOPHEN 5-325 MG PO TABS: 1.0000 | ORAL_TABLET | Freq: Once | ORAL | Status: AC

## 2020-02-26 MED ORDER — SODIUM CHLORIDE 0.9 % IR SOLN
Status: DC | PRN
  Administered 2020-02-26: 3000 mL

## 2020-02-26 MED ORDER — FENTANYL CITRATE (PF) 100 MCG/2ML IJ SOLN
INTRAMUSCULAR | Status: DC | PRN
  Administered 2020-02-26: 100 ug via INTRAVENOUS
  Administered 2020-02-26: 50 ug via INTRAVENOUS

## 2020-02-26 MED ORDER — BUPIVACAINE-EPINEPHRINE 0.5% -1:200000 IJ SOLN
INTRAMUSCULAR | Status: AC
  Filled 2020-02-26: qty 1

## 2020-02-26 MED ORDER — EPHEDRINE 5 MG/ML INJ
INTRAVENOUS | Status: AC
  Filled 2020-02-26: qty 10

## 2020-02-26 MED ORDER — PROPOFOL 1000 MG/100ML IV EMUL
INTRAVENOUS | Status: AC
  Filled 2020-02-26: qty 100

## 2020-02-26 MED ORDER — MIDAZOLAM HCL 2 MG/2ML IJ SOLN
INTRAMUSCULAR | Status: AC
  Filled 2020-02-26: qty 2

## 2020-02-26 MED ORDER — FENTANYL CITRATE (PF) 100 MCG/2ML IJ SOLN
INTRAMUSCULAR | Status: AC
  Filled 2020-02-26: qty 2

## 2020-02-26 MED ORDER — LIDOCAINE 2% (20 MG/ML) 5 ML SYRINGE
INTRAMUSCULAR | Status: AC
  Filled 2020-02-26: qty 5

## 2020-02-26 MED ORDER — LIDOCAINE 2% (20 MG/ML) 5 ML SYRINGE
INTRAMUSCULAR | Status: DC | PRN
  Administered 2020-02-26: 100 mg via INTRAVENOUS

## 2020-02-26 MED ORDER — CHLORHEXIDINE GLUCONATE 4 % EX LIQD: 60.0000 mL | Freq: Once | CUTANEOUS | Status: DC

## 2020-02-26 MED ORDER — ACETAMINOPHEN 500 MG PO TABS
1000.0000 mg | ORAL_TABLET | Freq: Once | ORAL | Status: AC
  Administered 2020-02-26: 1000 mg via ORAL
  Filled 2020-02-26: qty 2

## 2020-02-26 MED ORDER — HYDROCODONE-ACETAMINOPHEN 5-325 MG PO TABS
ORAL_TABLET | ORAL | Status: AC
  Administered 2020-02-26: 1 via ORAL
  Filled 2020-02-26: qty 1

## 2020-02-26 MED ORDER — ALBUTEROL SULFATE HFA 108 (90 BASE) MCG/ACT IN AERS
INHALATION_SPRAY | RESPIRATORY_TRACT | Status: AC
  Filled 2020-02-26: qty 6.7

## 2020-02-26 MED ORDER — BUPIVACAINE-EPINEPHRINE 0.5% -1:200000 IJ SOLN
INTRAMUSCULAR | Status: DC | PRN
  Administered 2020-02-26: 30 mL

## 2020-02-26 MED ORDER — FENTANYL CITRATE (PF) 100 MCG/2ML IJ SOLN
INTRAMUSCULAR | Status: AC
  Administered 2020-02-26: 13:00:00 25 ug via INTRAVENOUS
  Filled 2020-02-26: qty 2

## 2020-02-26 MED ORDER — PHENYLEPHRINE HCL-NACL 10-0.9 MG/250ML-% IV SOLN
INTRAVENOUS | Status: DC | PRN
  Administered 2020-02-26: 25 ug/min via INTRAVENOUS

## 2020-02-26 MED ORDER — DEXAMETHASONE SODIUM PHOSPHATE 10 MG/ML IJ SOLN
INTRAMUSCULAR | Status: AC
  Filled 2020-02-26: qty 1

## 2020-02-26 MED ORDER — FENTANYL CITRATE (PF) 100 MCG/2ML IJ SOLN
25.0000 ug | INTRAMUSCULAR | Status: DC | PRN
  Administered 2020-02-26 (×2): 50 ug via INTRAVENOUS

## 2020-02-26 MED ORDER — LACTATED RINGERS IV SOLN: INTRAVENOUS | Status: DC

## 2020-02-26 MED ORDER — SUCCINYLCHOLINE CHLORIDE 200 MG/10ML IV SOSY
PREFILLED_SYRINGE | INTRAVENOUS | Status: DC | PRN
  Administered 2020-02-26: 120 mg via INTRAVENOUS

## 2020-02-26 MED ORDER — ONDANSETRON HCL 4 MG/2ML IJ SOLN
INTRAMUSCULAR | Status: AC
  Filled 2020-02-26: qty 2

## 2020-02-26 MED ORDER — SUCCINYLCHOLINE CHLORIDE 200 MG/10ML IV SOSY
PREFILLED_SYRINGE | INTRAVENOUS | Status: AC
  Filled 2020-02-26: qty 10

## 2020-02-26 MED ORDER — PROPOFOL 10 MG/ML IV BOLUS
INTRAVENOUS | Status: DC | PRN
  Administered 2020-02-26: 200 mg via INTRAVENOUS

## 2020-02-26 MED ORDER — PROMETHAZINE HCL 25 MG/ML IJ SOLN: 6.2500 mg | INTRAMUSCULAR | Status: DC | PRN

## 2020-02-26 MED ORDER — FENTANYL CITRATE (PF) 100 MCG/2ML IJ SOLN
INTRAMUSCULAR | Status: AC
  Administered 2020-02-26: 25 ug via INTRAVENOUS
  Filled 2020-02-26: qty 2

## 2020-02-26 MED ORDER — DEXAMETHASONE SODIUM PHOSPHATE 10 MG/ML IJ SOLN
INTRAMUSCULAR | Status: DC | PRN
  Administered 2020-02-26: 10 mg via INTRAVENOUS

## 2020-02-26 MED ORDER — PHENYLEPHRINE HCL (PRESSORS) 10 MG/ML IV SOLN
INTRAVENOUS | Status: DC | PRN
  Administered 2020-02-26: 80 ug via INTRAVENOUS
  Administered 2020-02-26: 120 ug via INTRAVENOUS

## 2020-02-26 MED ORDER — PROPOFOL 500 MG/50ML IV EMUL
INTRAVENOUS | Status: DC | PRN
  Administered 2020-02-26: 125 ug/kg/min via INTRAVENOUS

## 2020-02-26 MED ORDER — PROPOFOL 500 MG/50ML IV EMUL
INTRAVENOUS | Status: AC
  Filled 2020-02-26: qty 50

## 2020-02-26 MED ORDER — ONDANSETRON HCL 4 MG/2ML IJ SOLN
INTRAMUSCULAR | Status: DC | PRN
  Administered 2020-02-26: 4 mg via INTRAVENOUS

## 2020-02-26 MED ORDER — MIDAZOLAM HCL 5 MG/5ML IJ SOLN
INTRAMUSCULAR | Status: DC | PRN
  Administered 2020-02-26: 2 mg via INTRAVENOUS

## 2020-02-26 MED ORDER — ALBUTEROL SULFATE HFA 108 (90 BASE) MCG/ACT IN AERS
INHALATION_SPRAY | RESPIRATORY_TRACT | Status: DC | PRN
  Administered 2020-02-26: 2 via RESPIRATORY_TRACT

## 2020-02-26 MED ORDER — PHENYLEPHRINE 40 MCG/ML (10ML) SYRINGE FOR IV PUSH (FOR BLOOD PRESSURE SUPPORT)
PREFILLED_SYRINGE | INTRAVENOUS | Status: AC
  Filled 2020-02-26: qty 10

## 2020-02-26 MED ORDER — PROPOFOL 10 MG/ML IV BOLUS
INTRAVENOUS | Status: AC
  Filled 2020-02-26: qty 20

## 2020-02-26 MED ORDER — EPHEDRINE SULFATE-NACL 50-0.9 MG/10ML-% IV SOSY
PREFILLED_SYRINGE | INTRAVENOUS | Status: DC | PRN
  Administered 2020-02-26: 5 mg via INTRAVENOUS
  Administered 2020-02-26: 10 mg via INTRAVENOUS
  Administered 2020-02-26: 5 mg via INTRAVENOUS

## 2020-02-26 SURGICAL SUPPLY — 30 items
BNDG ELASTIC 6X5.8 VLCR STR LF (GAUZE/BANDAGES/DRESSINGS) IMPLANT
COVER SURGICAL LIGHT HANDLE (MISCELLANEOUS) ×2 IMPLANT
COVER WAND RF STERILE (DRAPES) IMPLANT
DISSECTOR 4.0MM X 13CM (MISCELLANEOUS) ×2 IMPLANT
DRAPE U-SHAPE 47X51 STRL (DRAPES) ×2 IMPLANT
DRSG PAD ABDOMINAL 8X10 ST (GAUZE/BANDAGES/DRESSINGS) ×4 IMPLANT
DURAPREP 26ML APPLICATOR (WOUND CARE) ×2 IMPLANT
GAUZE SPONGE 4X4 12PLY STRL (GAUZE/BANDAGES/DRESSINGS) ×2 IMPLANT
GAUZE XEROFORM 1X8 LF (GAUZE/BANDAGES/DRESSINGS) ×2 IMPLANT
GLOVE BIOGEL M STRL SZ7.5 (GLOVE) ×2 IMPLANT
GLOVE BIOGEL PI IND STRL 7.5 (GLOVE) ×1 IMPLANT
GLOVE BIOGEL PI IND STRL 8.5 (GLOVE) ×2 IMPLANT
GLOVE BIOGEL PI INDICATOR 7.5 (GLOVE) ×1
GLOVE BIOGEL PI INDICATOR 8.5 (GLOVE) ×2
GLOVE ORTHO TXT STRL SZ7.5 (GLOVE) ×2 IMPLANT
GLOVE SURG ORTHO 8.0 STRL STRW (GLOVE) ×4 IMPLANT
GOWN STRL REUS W/ TWL XL LVL3 (GOWN DISPOSABLE) ×2 IMPLANT
GOWN STRL REUS W/TWL XL LVL3 (GOWN DISPOSABLE) ×4
IV LACTATED RINGER IRRG 3000ML (IV SOLUTION) ×8
IV LR IRRIG 3000ML ARTHROMATIC (IV SOLUTION) ×4 IMPLANT
KIT BASIN OR (CUSTOM PROCEDURE TRAY) ×2 IMPLANT
MANIFOLD NEPTUNE II (INSTRUMENTS) ×4 IMPLANT
PACK ARTHROSCOPY WL (CUSTOM PROCEDURE TRAY) ×2 IMPLANT
PADDING CAST COTTON 6X4 STRL (CAST SUPPLIES) ×3 IMPLANT
PENCIL SMOKE EVACUATOR (MISCELLANEOUS) IMPLANT
PROBE BIPOLAR ATHRO 135MM 90D (MISCELLANEOUS) IMPLANT
PROTECTOR NERVE ULNAR (MISCELLANEOUS) ×2 IMPLANT
SUT ETHILON 4 0 PS 2 18 (SUTURE) ×2 IMPLANT
TOWEL OR 17X26 10 PK STRL BLUE (TOWEL DISPOSABLE) ×2 IMPLANT
TUBING ARTHROSCOPY IRRIG 16FT (MISCELLANEOUS) ×2 IMPLANT

## 2020-02-26 NOTE — Transfer of Care (Signed)
Immediate Anesthesia Transfer of Care Note  Patient: Norma Lopez  Procedure(s) Performed: KNEE ARTHROSCOPY WITH MEDIAL MENISECTOMY (Left Knee)  Patient Location: PACU  Anesthesia Type:General  Level of Consciousness: awake, alert  and oriented  Airway & Oxygen Therapy: Patient Spontanous Breathing and Patient connected to face mask oxygen  Post-op Assessment: Report given to RN and Post -op Vital signs reviewed and stable  Post vital signs: Reviewed and stable  Last Vitals:  Vitals Value Taken Time  BP 111/50 02/26/20 1235  Temp    Pulse 80 02/26/20 1237  Resp 17 02/26/20 1237  SpO2 99 % 02/26/20 1237  Vitals shown include unvalidated device data.  Last Pain:  Vitals:   02/26/20 0900  TempSrc:   PainSc: 6          Complications: No apparent anesthesia complications

## 2020-02-26 NOTE — Progress Notes (Signed)
Called patient's husband to inform him that patient has just gone to surgery. The next person to call him will be Dr Lorre Nick. He verbalizes understanding.

## 2020-02-26 NOTE — Progress Notes (Signed)
Pt discharged in NAD, VSS, pain tolerable. Pt given discharge instructions, all questions answered. Pt discharged by wheelchair home with husband.

## 2020-02-26 NOTE — Anesthesia Procedure Notes (Signed)
Procedure Name: Intubation Date/Time: 02/26/2020 11:45 AM Performed by: Maxwell Caul, CRNA Pre-anesthesia Checklist: Patient identified, Emergency Drugs available, Suction available and Patient being monitored Patient Re-evaluated:Patient Re-evaluated prior to induction Oxygen Delivery Method: Circle system utilized Preoxygenation: Pre-oxygenation with 100% oxygen Induction Type: IV induction and Rapid sequence Laryngoscope Size: Mac and 4 Grade View: Grade I Tube type: Oral Tube size: 7.5 mm Number of attempts: 1 Airway Equipment and Method: Stylet Placement Confirmation: ETT inserted through vocal cords under direct vision,  positive ETCO2 and breath sounds checked- equal and bilateral Secured at: 22 cm Tube secured with: Tape Dental Injury: Teeth and Oropharynx as per pre-operative assessment

## 2020-02-26 NOTE — Anesthesia Postprocedure Evaluation (Signed)
Anesthesia Post Note  Patient: Deniss J Purves  Procedure(s) Performed: KNEE ARTHROSCOPY WITH MEDIAL MENISECTOMY (Left Knee)     Patient location during evaluation: PACU Anesthesia Type: General Level of consciousness: awake and alert Pain management: pain level controlled Vital Signs Assessment: post-procedure vital signs reviewed and stable Respiratory status: spontaneous breathing, nonlabored ventilation, respiratory function stable and patient connected to nasal cannula oxygen Cardiovascular status: blood pressure returned to baseline and stable Postop Assessment: no apparent nausea or vomiting Anesthetic complications: no    Last Vitals:  Vitals:   02/26/20 1245 02/26/20 1300  BP: 109/60 113/62  Pulse: 72 73  Resp: 15 12  Temp:    SpO2: 100% 97%    Last Pain:  Vitals:   02/26/20 1245  TempSrc:   PainSc: 5                  Axil Copeman DAVID

## 2020-02-26 NOTE — H&P (Signed)
Norma Lopez MRN:  [id number] DOB/SEX:  [id number]/female  CHIEF COMPLAINT:  Painful left Knee  HISTORY: Patient is a 59 y.o. female presented with a history of pain in the left knee. Onset of symptoms was abrupt starting a few months ago with gradually worsening course since that time. Patient has been treated conservatively with over-the-counter NSAIDs and activity modification. Patient currently rates pain in the knee at 10 out of 10 with activity. There is pain at night.  PAST MEDICAL HISTORY: Patient Active Problem List   Diagnosis Date Noted  . Preop cardiovascular exam 02/20/2020  . Precordial chest pain 08/26/2018  . Palpitations 08/26/2018  . Elevated liver enzymes 11/02/2017  . Gait disorder 07/30/2016  . 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 Conjunctival hemorrhage of right eye 06/17/2016  . Syncope 04/06/2016  . Situational anxiety 01/15/2016  . Diabetes mellitus type 2, controlled ([place]) 01/13/2016  . Fatigue 01/13/2016  . Hypoxemia 01/13/2016  . Thyroid nodule 01/13/2016  . Vitamin D deficiency 01/13/2016  . Age-related nuclear cataract of both eyes 01/13/2016  . Chronic dryness of both eyes 01/13/2016  . Conjunctival concretions of both eyes 01/13/2016  . Edema 01/13/2016  . History of varicose veins 01/13/2016  . Dyspnea on exertion 01/13/2016  . Adnexal mass 10/15/2015  . OTHER PREMATURE BEATS 02/02/2011  . DIZZINESS 02/02/2011  . Ventricular premature depolarization 02/02/2011  . SLEEP APNEA 11/11/2008  . ALLERGIC RHINITIS 06/11/2008  . PNEUMOCONIOSIS 05/16/2008  . COUGH 04/26/2008  . HYPERLIPIDEMIA 04/20/2008  . PANIC DISORDER 04/20/2008  . ASTHMA 04/20/2008  . OTHER DISEASES OF LUNG NOT ELSEWHERE CLASSIFIED 04/20/2008  . GERD 04/20/2008  . 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 ADD (attention deficit disorder with hyperactivity)   . Allergic rhinitis   . Allergy   . Anxiety and depression   . Asthma    . COPD (chronic obstructive pulmonary disease) ([place])   . Diabetes ([place])   . Diarrhea, functional   . Diverticulosis   . Dysrhythmia 2015   none since 2015  . Family history of colon cancer   . Fibrosis of lung ([place])   . GERD (gastroesophageal reflux disease)   . History of pneumoconiosis    with ILD with nocturnal hypoxemia on 02 2 l/min  . Hyperlipidemia   . IBS (irritable bowel syndrome)   . Migraines   . Multiple lung nodules on CT   . Neuromuscular disorder ([facility])    neuropathy feet  . Obesity   . Oropharyngeal dysphagia   . Panic disorder   . PONV (postoperative nausea and vomiting)   . PTSD (post-traumatic stress disorder)   . Rectal incontinence   . Restless leg syndrome   . Sarcoidosis   . Sleep apnea    patient reports using CPAP machine at night  . Thyroid nodule    Past Surgical History:  Procedure Laterality Date  . BREAST EXCISIONAL BIOPSY Left 1985, 2006   Benign  . CHOLECYSTECTOMY    . COLONOSCOPY  06/23/2017   Colonic polyp status post polypectomy. Pancolonic diverticulosis predominantly in the sigmoid colon'  . ESOPHAGOGASTRODUODENOSCOPY  07/25/2008   Healed esophageal erosions. Irregular Z-line suggestive of gastroesophageal reflux (biopsied to rule out short-segment Barrett's esophagus) Mild gastritis.  Marland Kitchen KNEE ARTHROSCOPY     x3  . TONSILLECTOMY    . TOTAL ABDOMINAL HYSTERECTOMY  1992  . TUBAL LIGATION    . veins stripped  Left 1989     MEDICATIONS:  Medications Prior to Admission  Medication Sig Dispense Refill Last Dose  . acetaminophen (TYLENOL) 500 MG tablet Take 500-1,000 mg by mouth every 6 (six) hours as needed (for pain.).     Marland Kitchen albuterol (ACCUNEB) 0.63 MG/3ML nebulizer solution Take 0.63 mg by nebulization every 6 (six) hours as needed for wheezing.     Marland Kitchen albuterol (PROVENTIL HFA;VENTOLIN HFA) 108 (90 BASE) MCG/ACT inhaler Inhale 2 puffs into the lungs every 6 (six) hours as needed for wheezing or shortness of breath.      .  Armodafinil 250 MG tablet Take 250 mg by mouth daily.     . busPIRone (BUSPAR) 10 MG tablet Take 15 mg by mouth in the morning and at bedtime.     . Carboxymethylcellulose Sod PF (REFRESH PLUS) 0.5 % SOLN Place 1 drop into both eyes at bedtime.     . Cholecalciferol (VITAMIN D-3) 125 MCG (5000 UT) TABS Take 5,000 Units by mouth daily.     Marland Kitchen desvenlafaxine (PRISTIQ) 100 MG 24 hr tablet Take 1 tablet (100 mg total) by mouth daily. 30 tablet 0   . dexlansoprazole (DEXILANT) 60 MG capsule Take 1 capsule (60 mg total) by mouth daily. (Patient taking differently: Take 60 mg by mouth at bedtime. ) 30 capsule 6   . diclofenac Sodium (VOLTAREN) 1 % GEL Apply 1 application topically 4 (four) times daily as needed (knee pain.).     Marland Kitchen diphenhydrAMINE (BENADRYL) 25 MG tablet Take 25 mg by mouth every 6 (six) hours as needed (allergic reactions.).     Marland Kitchen docusate sodium (COLACE) 100 MG capsule Take 100 mg by mouth 2 (two) times daily as needed (constipation.).     Marland Kitchen ELDERBERRY PO Take 1 tablet by mouth daily. (Sambucol) With C & Zinc     . EQUETRO 100 MG CP12 12 hr capsule Take 100 mg by mouth at bedtime.     . EQUETRO 300 MG CP12 Take 1 capsule by mouth at bedtime.     Marland Kitchen estradiol (CLIMARA - DOSED IN MG/24 HR) 0.075 mg/24hr patch Place 1 patch (0.075 mg total) onto the skin once a week. Please specify directions, refills and quantity (Patient taking differently: Place 0.075 mg onto the skin every 14 (fourteen) days. Please specify directions, refills and quantity) 12 patch 4   . estradiol (ESTRACE) 0.1 MG/GM vaginal cream APPLY 1/4 APPLICATOR VAGINALLY and A THIN APPLICATION TO VULVA TWICE WEEKLY (Patient taking differently: Place [id number] Applicatorfuls vaginally See admin instructions. APPLY 1/4 APPLICATOR VAGINALLY and A THIN APPLICATION TO VULVA TWICE WEEKLY) 126 g 3   . famotidine (PEPCID) 20 MG tablet Take 1 tablet (20 mg total) by mouth daily. 30 tablet 6   . Fluticasone Furoate 100 MCG/ACT AEPB Inhale 1 puff  into the lungs at bedtime. ARNUITY ELLIPTA     . Guaifenesin 1200 MG TB12 Take 1,200 mg by mouth 2 (two) times daily as needed (congestion).     Marland Kitchen L-Methylfolate 15 MG TABS Take 15 mg by mouth at bedtime.      Marland Kitchen lisinopril (ZESTRIL) 10 MG tablet Take 10 mg by mouth at bedtime.      Marland Kitchen loratadine (CLARITIN) 10 MG tablet Take 10 mg by mouth at bedtime.     . magnesium oxide (MAG-OX) 400 MG tablet Take 400 mg by mouth 3 (three) times a week. Sundays, Tuesdays, Thursdays.     . Menthol, Topical Analgesic, (BIOFREEZE EX) Apply 1 application topically 4 (four) times daily as needed (pain.).     Marland Kitchen  metFORMIN (GLUCOPHAGE) 500 MG tablet Take 500 mg by mouth in the morning and at bedtime.      . methotrexate 50 MG/2ML injection Inject 7.5 mg into the skin every Sunday.   3   . metoprolol succinate (TOPROL-XL) 25 MG 24 hr tablet Take 25 mg by mouth at bedtime.     Marland Kitchen nystatin (MYCOSTATIN/NYSTOP) powder Apply 1 application topically as needed (yeast (skin folds)).      . Omega-3 Fatty Acids (FISH OIL ULTRA) 1400 MG CAPS Take 1,400 mg by mouth daily.     . OXYGEN Inhale 2 L into the lungs at bedtime.      . pramipexole (MIRAPEX) 1 MG tablet Take 1 mg by mouth at bedtime.     . Probiotic Product (DIGESTIVE ADVANTAGE PO) Take 1 capsule by mouth daily.     [place] Breath COQ10/UBIQUINOL/MEGA 100 MG CAPS Take 100 mg by mouth daily.     . rosuvastatin (CRESTOR) 10 MG tablet Take 10 mg by mouth every Monday, Wednesday, and Friday at 8 PM.   1   . sodium chloride (OCEAN) 0.65 % SOLN nasal spray Place 1 spray into both nostrils at bedtime.     . TURMERIC PO Take 1 capsule by mouth daily.     Marland Kitchen AUVI-Q 0.3 MG/0.3ML SOAJ injection Use as directed for life-threatening allergic reaction. 4 Device 1   . metoprolol tartrate (LOPRESSOR) 25 MG tablet TAKE 1 TABLET BY MOUTH TWICE A DAY 180 tablet 1 Not Taking at Unknown time  . ondansetron (ZOFRAN) 4 MG tablet Take 4 mg by mouth every 8 (eight) hours as needed for nausea or vomiting.        ALLERGIES:   Allergies  Allergen Reactions  . Gadolinium Itching, Rash, Shortness Of Breath, Swelling and Tinitus  . Latex Itching, Rash and Other (See Comments)    Blisters    . Contrast Media [Iodinated Diagnostic Agents] Itching    (MRI CONTRAST ONLY)  . Tape Rash    redness   . Oxycodone Hcl Itching  . Azithromycin Palpitations  . Shellfish Allergy Rash    SHRIMP  . Symbicort [Budesonide-Formoterol Fumarate] Palpitations    Tachycardia  . Wellbutrin [Bupropion] Palpitations    Tachycardia     REVIEW OF SYSTEMS:  A comprehensive review of systems was negative except for: Musculoskeletal: positive for myalgias and stiff joints   FAMILY HISTORY:   Family History  Problem Relation Age of Onset  . COPD Mother   . Arthritis Mother   . Depression Mother   . Anxiety disorder Mother   . COPD Father   . Alcohol abuse Father   . Heart disease Father   . Diabetes Father   . Cancer Father        lung  . Heart attack Father   . Asthma Sister   . Cancer Sister 2       colon  . Asthma Sister   . Diabetes Sister   . Breast cancer Sister 82  . Sleep apnea Sister   . Hyperlipidemia Other   . Allergies Other   . Obesity Other   . Anxiety disorder Other   . Arthritis Other     SOCIAL HISTORY:   Social History   Tobacco Use  . Smoking status: Former Smoker    Years: 6.00    Quit date: 12/28/1996    Years since quitting: 23.1  . Smokeless tobacco: Never Used  . Tobacco comment: Exposed to second hand smoke  Substance  Use Topics  . Alcohol use: No     EXAMINATION:  Vital signs in last 24 hours: Temp:  [98.3 F (36.8 C)] 98.3 F (36.8 C) (03/01 0848) Pulse Rate:  [60] 60 (03/01 0848) Resp:  [18] 18 (03/01 0848) BP: (117)/(69) 117/69 (03/01 0848) SpO2:  [97 %] 97 % (03/01 0848)  BP 117/69   Pulse 60   Temp 98.3 F (36.8 C) (Oral)   Resp 18   SpO2 97%   General Appearance:    Alert, cooperative, no distress, appears stated age  Head:     Normocephalic, without obvious abnormality, atraumatic  Eyes:    PERRL, conjunctiva/corneas clear, EOM's intact, fundi    benign, both eyes  Ears:    Normal TM's and external ear canals, both ears  Nose:   Nares normal, septum midline, mucosa normal, no drainage    or sinus tenderness  Throat:   Lips, mucosa, and tongue normal; teeth and gums normal  Neck:   Supple, symmetrical, trachea midline, no adenopathy;    thyroid:  no enlargement/tenderness/nodules; no carotid   bruit or JVD  Back:     Symmetric, no curvature, ROM normal, no CVA tenderness  Lungs:     Clear to auscultation bilaterally, respirations unlabored  Chest Wall:    No tenderness or deformity   Heart:    Regular rate and rhythm, S1 and S2 normal, no murmur, rub   or gallop  Breast Exam:    No tenderness, masses, or nipple abnormality  Abdomen:     Soft, non-tender, bowel sounds active all four quadrants,    no masses, no organomegaly  Genitalia:    Normal female without lesion, discharge or tenderness  Rectal:    Normal tone, no masses or tenderness;   guaiac negative stool  Extremities:   Extremities normal, atraumatic, no cyanosis or edema  Pulses:   2+ and symmetric all extremities  Skin:   Skin color, texture, turgor normal, no rashes or lesions  Lymph nodes:   Cervical, supraclavicular, and axillary nodes normal  Neurologic:   CNII-XII intact, normal strength, sensation and reflexes    throughout    Musculoskeletal:  ROM 0-120, Ligaments intact,  Imaging Review Plain radiographs demonstrate mild degenerative joint disease of the left knee. The overall alignment is neutral. The bone quality appears to be good for age and reported activity level. MRI reviewed shows torn medial meniscus  Assessment/Plan: Internal Derangement Left knee   The patient history, physical examination and imaging studies are consistent with internal derangement of the left knee. The patient has failed conservative treatment.  The  clearance notes were reviewed.  After discussion with the patient it was felt that Knee arthroscopy was indicated. The procedure,  risks, and benefits were presented and reviewed.The patient acknowledged the explanation, agreed to proceed with the plan.    Donia Ast 02/26/2020, 8:51 AM

## 2020-03-04 NOTE — Op Note (Signed)
Preop diagnosis: Left knee medial and lateral meniscus tears  Postop diagnosis: Same  Indication for procedure: Patient is a 59 year old female with mechanical symptoms and MRI evidence of a meniscus tear.  Indication for procedure:  Description of procedure:  Patient was taken to the operating room and administered general anesthesia.  The left leg was prepped and draped in the usual fashion.  Inferolateral and inferomedial portals were created with a #11 blade blunt trocar and cannula.  Diagnostic arthroscopy revealed minimal chondromalacia in the patellofemoral joint and a small plica.  I went into the notch the ACL the PCL were normal.  I went to the medial compartment which showed a large tear of the posterior horn of the medial meniscus.  I went into the figure-of-four position and showed a radial tearing of the anterior and posterior horns of the meniscus.  I then went back in the medial compartment and extension and valgus force applied.  I performed a partial medial meniscectomy removing approximately 30% of the medial meniscus with a straight basket forceps and gray-white shaver.  I then went into the figure-of-four position and performed a partial lateral meniscectomy with a straight basket forceps and gray-white shave remove approximately 20% of the meniscus mainly in the anterior and posterior horns.  No chondroplasty was carried out.  I went into the patellofemoral joint in extension and performed a plica ectomy.  I then lavaged the knee and closed with 4 nylon sutures dressed with Xeroform dressing sponges sterile water and Ace wrap.  Complication: none   SL

## 2020-03-05 DIAGNOSIS — Z9889 Other specified postprocedural states: Secondary | ICD-10-CM

## 2020-03-05 HISTORY — DX: Other specified postprocedural states: Z98.890

## 2020-04-15 ENCOUNTER — Telehealth: Payer: Self-pay | Admitting: Gastroenterology

## 2020-04-15 DIAGNOSIS — R10819 Abdominal tenderness, unspecified site: Secondary | ICD-10-CM

## 2020-04-15 DIAGNOSIS — R1032 Left lower quadrant pain: Secondary | ICD-10-CM

## 2020-04-15 NOTE — Telephone Encounter (Signed)
I have called patient and left message for her to return my call.  

## 2020-04-15 NOTE — Telephone Encounter (Signed)
Pt reported that she is having a diverticulitis flare.  Please advise.

## 2020-04-16 NOTE — Telephone Encounter (Signed)
Please advise 

## 2020-04-23 NOTE — Telephone Encounter (Signed)
Can you please call Norma Lopez and find out how she is doing. Not all diverticulitis needs antibiotics Hope she is feeling better.  RG

## 2020-04-24 ENCOUNTER — Ambulatory Visit: Payer: BC Managed Care – PPO | Admitting: Nurse Practitioner

## 2020-04-24 MED ORDER — PREDNISONE 50 MG PO TABS
ORAL_TABLET | ORAL | 0 refills | Status: DC
Start: 2020-04-24 — End: 2020-05-08

## 2020-04-24 NOTE — Telephone Encounter (Signed)
Get CBC, CMP and lipase Get CT abdomen/pelvis with p.o. and IV contrast - so that results are available to [facility] She is a [employer]. -Would like to make sure she does not have an abscess   RG

## 2020-04-24 NOTE — Telephone Encounter (Signed)
Patient is scheduled for Ct Scan at [place] on 05/01/20 at 10am. Patient will come by office and pick up instructions and contrast and will have labs drawn at [facility] office. Patient has an allergy to Contrast so per Dr. Lyndel Safe pretreat patient as followed: Prednisone 50 mg Take one tablet by mouth 13 hours before CT. Take one tablet by mouth 7 hours before CT.  Take one tablet by mouth 1 hour before CT.   Benadryl 25 mg one hour before CT  Patient aware of all instructions and agrees with plan.

## 2020-04-24 NOTE — Telephone Encounter (Signed)
I have called and spoke to patient, she is still complaining of some abdominal tenderness. She said that she completed a 10 day course of Flagyl about 3-4 weeks ago and felt better but after 5 days of completing antibiotics she felt like it started getting worse again. She said she started a bland diet when symptoms started back and it does feel some better but is still complaining of some abdominal tenderness. I have scheduled patient an appointment to see [facility].

## 2020-04-30 ENCOUNTER — Other Ambulatory Visit (INDEPENDENT_AMBULATORY_CARE_PROVIDER_SITE_OTHER): Payer: BC Managed Care – PPO

## 2020-04-30 DIAGNOSIS — R1032 Left lower quadrant pain: Secondary | ICD-10-CM | POA: Diagnosis not present

## 2020-04-30 DIAGNOSIS — R10819 Abdominal tenderness, unspecified site: Secondary | ICD-10-CM

## 2020-04-30 LAB — COMPREHENSIVE METABOLIC PANEL
ALT: 20 U/L (ref 0–35)
AST: 19 U/L (ref 0–37)
Albumin: 4.2 g/dL (ref 3.5–5.2)
Alkaline Phosphatase: 60 U/L (ref 39–117)
BUN: 16 mg/dL (ref 6–23)
CO2: 27 mEq/L (ref 19–32)
Calcium: 8.7 mg/dL (ref 8.4–10.5)
Chloride: 105 mEq/L (ref 96–112)
Creatinine, Ser: 0.88 mg/dL (ref 0.40–1.20)
GFR: 65.98 mL/min (ref >60.00)
Glucose, Bld: 93 mg/dL (ref 70–99)
Potassium: 3.9 mEq/L (ref 3.5–5.1)
Sodium: 138 mEq/L (ref 135–145)
Total Bilirubin: 0.3 mg/dL (ref 0.2–1.2)
Total Protein: 6.6 g/dL (ref 6.0–8.3)

## 2020-04-30 LAB — LIPASE: Lipase: 58 U/L (ref 11.0–59.0)

## 2020-04-30 LAB — CBC WITH DIFFERENTIAL/PLATELET
Basophils Absolute: 0 10*3/uL (ref 0.0–0.1)
Basophils Relative: 1 % (ref 0.0–3.0)
Eosinophils Absolute: 0.2 10*3/uL (ref 0.0–0.7)
Eosinophils Relative: 4.4 % (ref 0.0–5.0)
HCT: 42.8 % (ref 36.0–46.0)
Hemoglobin: 14.4 g/dL (ref 12.0–15.0)
Lymphocytes Relative: 28.1 % (ref 12.0–46.0)
Lymphs Abs: 1.1 10*3/uL (ref 0.7–4.0)
MCHC: 33.6 g/dL (ref 30.0–36.0)
MCV: 89.1 fl (ref 78.0–100.0)
Monocytes Absolute: 0.4 10*3/uL (ref 0.1–1.0)
Monocytes Relative: 10.3 % (ref 3.0–12.0)
Neutro Abs: 2.3 10*3/uL (ref 1.4–7.7)
Neutrophils Relative %: 56.2 % (ref 43.0–77.0)
Platelets: 307 10*3/uL (ref 150.0–400.0)
RBC: 4.8 Mil/uL (ref 3.87–5.11)
RDW: 13.6 % (ref 11.5–15.5)
WBC: 4.1 10*3/uL (ref 4.0–10.5)

## 2020-05-01 ENCOUNTER — Encounter (HOSPITAL_BASED_OUTPATIENT_CLINIC_OR_DEPARTMENT_OTHER): Payer: Self-pay

## 2020-05-01 ENCOUNTER — Ambulatory Visit (HOSPITAL_BASED_OUTPATIENT_CLINIC_OR_DEPARTMENT_OTHER)
Admission: RE | Admit: 2020-05-01 | Discharge: 2020-05-01 | Disposition: A | Discharge status: Home / Self Care | Payer: BC Managed Care – PPO | Source: Ambulatory Visit | Attending: Gastroenterology | Admitting: Gastroenterology

## 2020-05-01 ENCOUNTER — Other Ambulatory Visit: Payer: Self-pay

## 2020-05-01 DIAGNOSIS — R10819 Abdominal tenderness, unspecified site: Secondary | ICD-10-CM | POA: Diagnosis present

## 2020-05-01 DIAGNOSIS — R1032 Left lower quadrant pain: Secondary | ICD-10-CM | POA: Diagnosis not present

## 2020-05-01 MED ORDER — IOHEXOL 300 MG/ML  SOLN
100.0000 mL | Freq: Once | INTRAMUSCULAR | Status: AC | PRN
  Administered 2020-05-01: 100 mL via INTRAVENOUS

## 2020-05-07 NOTE — Progress Notes (Signed)
05/08/2020 WALTA FLECKER [id number] [birth year]   Chief Complaint: LLQ abdominal pain  History of Present Illness: Norma Lopez is a 59 year old female with a past medical history of anxiety, depression, asthma, COPD, sleep apnea, sarcoidosis diagnosed in 2009 on MTX, previously on Remicade in 2018 - 02/2019 and started on Humira  02/2020, GERD, diverticulitis x 2 in 2014/2015 and colon polyps. Past total hysterectomy. S/P left knee medial and lateral meniscus tears 02/26/2020.  Presents today for further evaluation regarding diarrhea and left lower quadrant abdominal pain.  She developed diarrhea 8 weeks ago occurred after eating a hamburger with a fried egg at the [employer].  Approximately 2 weeks later she developed left lower quadrant abdominal pain.  She described the left lower quadrant pain as a burning discomfort which felt like fire after passing a diarrhea stool.  She contacted her PCP who prescribed Flagyl 500 mg twice daily for 10 days.  Her abdominal pain and diarrhea initially improved however, her symptoms recurred 4 days after her last dose of Flagyl.  She uinderwent an abdominal/pelvic CT on 05/01/2020 identified mild left colonic diverticulosis without evidence of diverticulitis, otherwise was unremarkable.  She now reports having abdominal pain throughout the abdomen and is no longer localized to the left lower quadrant.  She was having 6-8 episodes of diarrhea daily.  She is now passing 3 mud-like stools daily with associated abdominal bloat.  She is having small solid bits of stool approximately twice weekly.  She has some mucus at times, no bloody mucus.  She is seeing a small amount of bright red blood in the toilet tissue once weekly or so which she contributes to having hemorrhoids.  Most recent antibiotic was the Flagyl as reported above.  She has been on Metformin for several years without recent change in the dosage.  She was started on Humira for sarcoidosis  approximately 10 weeks ago by Dr. Jeneen Rinks at [facility] in [place]. Humira injections are weekly, she is due to day for her next injection.  She is tried using a fiber supplement without improvement.  She then ate a low residue diet with no further improvement as well.  She underwent a colonoscopy 05/2017 by Dr. Lyndel Safe which identified a 4 mm tubular adenomatous polyp which was removed and pan colonic diverticulosis.  Sister with history of ulcerative colitis and colon cancer status post resection at the age of 52.  Her daughter has gluten sensitivity but does not carry the diagnosis of celiac disease.  CTAP with contrast 05/01/2020 (premedicated with Prednisone 50mg  po 13, 7 and 1 hr before CT  and Benadryl 25po x 1): Mild left colonic diverticulosis, without evidence of diverticulitis. No evidence of bowel obstruction.  Normal appendix. Status post cholecystectomy and hysterectomy. No CT findings to account for the patient's left lower quadrant abdominal pain.  Colonoscopy 05/2017: 4 mm colonic polyp s/p polypectomy (TA), pancolonic diverticulosis EGD 06/2008: Healed distal esophageal erosions, small HH, irregular Z-line with neg bx for Barrett's, mild gastritis. S/P dil 48Fr. Neg SB Bx for celiac, neg CLO, neg eso Bx.  CBC Latest Ref Rng & Units 05/08/2020 04/30/2020 02/16/2020  WBC 4.0 - 10.5 K/uL 3.7(L) 4.1 4.7  Hemoglobin 12.0 - 15.0 g/dL 14.7 14.4 14.4  Hematocrit 36.0 - 46.0 % 44.2 42.8 43.9  Platelets 150.0 - 400.0 K/uL 290.0 307.0 286   CMP Latest Ref Rng & Units 04/30/2020 02/16/2020 11/04/2015  Glucose 70 - 99 mg/dL 93 96 156(H)  BUN 6 -  23 mg/dL 16 18 15   Creatinine 0.40 - 1.20 mg/dL 0.88 0.74 0.98  Sodium 135 - 145 mEq/L 138 139 142  Potassium 3.5 - 5.1 mEq/L 3.9 3.9 3.5  Chloride 96 - 112 mEq/L 105 104 109  CO2 19 - 32 mEq/L 27 28 26   Calcium 8.4 - 10.5 mg/dL 8.7 9.2 8.9  Total Protein 6.0 - 8.3 g/dL 6.6 - 6.6  Total Bilirubin 0.2 - 1.2 mg/dL 0.3 - 0.8  Alkaline Phos 39 - 117 U/L 60 - 64   AST 0 - 37 U/L 19 - 23  ALT 0 - 35 U/L 20 - 22    Current Outpatient Medications on File Prior to Visit  Medication Sig Dispense Refill  . acetaminophen (TYLENOL) 500 MG tablet Take 500-1,000 mg by mouth every 6 (six) hours as needed (for pain.).    . Adalimumab 40 MG/0.4ML PSKT Inject 40 mg into the skin. Patient reports once weekly injections    . albuterol (ACCUNEB) 0.63 MG/3ML nebulizer solution Take 0.63 mg by nebulization every 6 (six) hours as needed for wheezing.    Marland Kitchen albuterol (PROVENTIL HFA;VENTOLIN HFA) 108 (90 BASE) MCG/ACT inhaler Inhale 2 puffs into the lungs every 6 (six) hours as needed for wheezing or shortness of breath.     . Armodafinil 250 MG tablet Take 250 mg by mouth daily.    Marland Kitchen AUVI-Q 0.3 MG/0.3ML SOAJ injection Use as directed for life-threatening allergic reaction. 4 Device 1  . busPIRone (BUSPAR) 10 MG tablet Take 15 mg by mouth in the morning and at bedtime.    . Carboxymethylcellulose Sod PF (REFRESH PLUS) 0.5 % SOLN Place 1 drop into both eyes at bedtime.    . Cholecalciferol (VITAMIN D-3) 125 MCG (5000 UT) TABS Take 5,000 Units by mouth daily.    Marland Kitchen desvenlafaxine (PRISTIQ) 100 MG 24 hr tablet Take 1 tablet (100 mg total) by mouth daily. 30 tablet 0  . dexlansoprazole (DEXILANT) 60 MG capsule Take 1 capsule (60 mg total) by mouth daily. (Patient taking differently: Take 60 mg by mouth at bedtime. ) 30 capsule 6  . diphenhydrAMINE (BENADRYL) 25 MG tablet Take 25 mg by mouth every 6 (six) hours as needed (allergic reactions.).    Marland Kitchen ELDERBERRY PO Take 1 tablet by mouth daily. (Sambucol) With C & Zinc    . EQUETRO 100 MG CP12 12 hr capsule Take 100 mg by mouth at bedtime.    . EQUETRO 300 MG CP12 Take 1 capsule by mouth at bedtime.    Marland Kitchen estradiol (CLIMARA - DOSED IN MG/24 HR) 0.075 mg/24hr patch Place 1 patch (0.075 mg total) onto the skin once a week. Please specify directions, refills and quantity (Patient taking differently: Place 0.075 mg onto the skin every 14  (fourteen) days. Please specify directions, refills and quantity) 12 patch 4  . estradiol (ESTRACE) 0.1 MG/GM vaginal cream APPLY 1/4 APPLICATOR VAGINALLY and A THIN APPLICATION TO VULVA TWICE WEEKLY (Patient taking differently: Place [id number] Applicatorfuls vaginally See admin instructions. APPLY 1/4 APPLICATOR VAGINALLY and A THIN APPLICATION TO VULVA TWICE WEEKLY) 126 g 3  . famotidine (PEPCID) 20 MG tablet Take 1 tablet (20 mg total) by mouth daily. 30 tablet 6  . Fluticasone Furoate 100 MCG/ACT AEPB Inhale 1 puff into the lungs at bedtime. ARNUITY ELLIPTA    . L-Methylfolate 15 MG TABS Take 15 mg by mouth at bedtime.     Marland Kitchen lisinopril (ZESTRIL) 10 MG tablet Take 10 mg by mouth at bedtime.     Marland Kitchen  loratadine (CLARITIN) 10 MG tablet Take 10 mg by mouth at bedtime.    . Menthol, Topical Analgesic, (BIOFREEZE EX) Apply 1 application topically 4 (four) times daily as needed (pain.).    Marland Kitchen metFORMIN (GLUCOPHAGE) 500 MG tablet Take 500 mg by mouth in the morning and at bedtime.     . methotrexate 50 MG/2ML injection Inject 7.5 mg into the skin every Sunday.   3  . metoprolol succinate (TOPROL-XL) 25 MG 24 hr tablet Take 25 mg by mouth at bedtime.    Marland Kitchen nystatin (MYCOSTATIN/NYSTOP) powder Apply 1 application topically as needed (yeast (skin folds)).     . Omega-3 Fatty Acids (FISH OIL ULTRA) 1400 MG CAPS Take 1,400 mg by mouth daily.    . OXYGEN Inhale 2 L into the lungs at bedtime.     . pramipexole (MIRAPEX) 1 MG tablet Take 1 mg by mouth at bedtime.    . Probiotic Product (DIGESTIVE ADVANTAGE PO) Take 1 capsule by mouth daily.    Keene Breath COQ10/UBIQUINOL/MEGA 100 MG CAPS Take 100 mg by mouth daily.    . rosuvastatin (CRESTOR) 10 MG tablet Take 10 mg by mouth every Monday, Wednesday, and Friday at 8 PM.   1  . sodium chloride (OCEAN) 0.65 % SOLN nasal spray Place 1 spray into both nostrils at bedtime.    . TURMERIC PO Take 1 capsule by mouth daily.     No current facility-administered medications on file  prior to visit.    Allergies  Allergen Reactions  . Gadolinium Itching, Rash, Shortness Of Breath, Swelling and Tinitus  . Latex Itching, Rash and Other (See Comments)    Blisters    . Contrast Media [Iodinated Diagnostic Agents] Itching    (MRI CONTRAST ONLY)  . Tape Rash    redness   . Oxycodone Hcl Itching  . Azithromycin Palpitations  . Shellfish Allergy Rash    SHRIMP  . Symbicort [Budesonide-Formoterol Fumarate] Palpitations    Tachycardia  . Wellbutrin [Bupropion] Palpitations    Tachycardia       Current Medications, Allergies, Past Medical History, Past Surgical History, Family History and Social History were reviewed in [employer] record.   Physical Exam: BP 120/80   Pulse 72   Temp 98 F (36.7 C)   Ht 5\' 6"  (1.676 m)   Wt 237 lb 9.6 oz (107.8 kg)   BMI 38.35 kg/m  General: Well developed 59 year old female  in no acute distress. Head: Normocephalic and atraumatic. Eyes: No scleral icterus. Conjunctiva pink . Ears: Normal auditory acuity. Lungs: Clear throughout to auscultation. Heart: Regular rate and rhythm, no murmur. Abdomen: Soft, nondistended. Right and left lower quadrant tenderness without rebound or guarding. No masses or hepatomegaly. Normal bowel sounds x 4 quadrants. Large lower midline scar intact.  Rectal: Deferred.  Musculoskeletal: Symmetrical with no gross deformities. Extremities: No edema. Neurological: Alert oriented x 4. No focal deficits.  Psychological: Alert and cooperative. Normal mood and affect  Assessment and Recommendations:  39. 59 year old female with diarrhea and  LLQ abdominal pain treated with Flagyl for presumed diverticulitis 03/2020. CTAP 5/5/201 that evidence of diverticulitis or colitis.  Similarly, her symptoms began after she started Humira prescribed for her sarcoidosis.  Sister with history of ulcerative colitis and colon cancer. -GI pathogen panel to include C. difficile PCR -CBC with  differential, CRP, TTG and IgA -Probiotic of choice once daily -Dicyclomine 10 mg 1 p.o. every 8 hours as needed -No dairy products -  PT to call our office if her symptoms worsen  2. History of tubular adenomatous colon polyps. Last colonoscopy 05/2017  3. History of Sarcoidosis on MTX and Humira   Further follow-up to be determined after the above evaluation completed

## 2020-05-08 ENCOUNTER — Ambulatory Visit (INDEPENDENT_AMBULATORY_CARE_PROVIDER_SITE_OTHER): Payer: BC Managed Care – PPO | Admitting: Nurse Practitioner

## 2020-05-08 ENCOUNTER — Other Ambulatory Visit (INDEPENDENT_AMBULATORY_CARE_PROVIDER_SITE_OTHER): Payer: BC Managed Care – PPO

## 2020-05-08 ENCOUNTER — Encounter: Payer: Self-pay | Admitting: Nurse Practitioner

## 2020-05-08 ENCOUNTER — Other Ambulatory Visit: Payer: Self-pay

## 2020-05-08 VITALS — BP 120/80 | HR 72 | Temp 98.0°F | Ht 66.0 in | Wt 237.6 lb

## 2020-05-08 DIAGNOSIS — R1030 Lower abdominal pain, unspecified: Secondary | ICD-10-CM

## 2020-05-08 DIAGNOSIS — R197 Diarrhea, unspecified: Secondary | ICD-10-CM

## 2020-05-08 LAB — CBC WITH DIFFERENTIAL/PLATELET
Basophils Absolute: 0 10*3/uL (ref 0.0–0.1)
Basophils Relative: 1 % (ref 0.0–3.0)
Eosinophils Absolute: 0.2 10*3/uL (ref 0.0–0.7)
Eosinophils Relative: 6.1 % — ABNORMAL HIGH (ref 0.0–5.0)
HCT: 44.2 % (ref 36.0–46.0)
Hemoglobin: 14.7 g/dL (ref 12.0–15.0)
Lymphocytes Relative: 26.4 % (ref 12.0–46.0)
Lymphs Abs: 1 10*3/uL (ref 0.7–4.0)
MCHC: 33.3 g/dL (ref 30.0–36.0)
MCV: 89 fl (ref 78.0–100.0)
Monocytes Absolute: 0.4 10*3/uL (ref 0.1–1.0)
Monocytes Relative: 10.6 % (ref 3.0–12.0)
Neutro Abs: 2.1 10*3/uL (ref 1.4–7.7)
Neutrophils Relative %: 55.9 % (ref 43.0–77.0)
Platelets: 290 10*3/uL (ref 150.0–400.0)
RBC: 4.97 Mil/uL (ref 3.87–5.11)
RDW: 13.3 % (ref 11.5–15.5)
WBC: 3.7 10*3/uL — ABNORMAL LOW (ref 4.0–10.5)

## 2020-05-08 LAB — IGA: IgA: 107 mg/dL (ref 68–378)

## 2020-05-08 LAB — C-REACTIVE PROTEIN: CRP: <1 mg/dL (ref 0.5–20.0)

## 2020-05-08 MED ORDER — DICYCLOMINE HCL 10 MG PO CAPS
10.0000 mg | ORAL_CAPSULE | Freq: Three times a day (TID) | ORAL | 1 refills | Status: DC | PRN
Start: 2020-05-08 — End: 2020-10-09

## 2020-05-08 NOTE — Patient Instructions (Addendum)
If you are age 58 or older, your body mass index should be between 23-30. Your Body mass index is 38.35 kg/m. If this is out of the aforementioned range listed, please consider follow up with your Primary Care Provider.  If you are age 51 or younger, your body mass index should be between 19-25. Your Body mass index is 38.35 kg/m. If this is out of the aformentioned range listed, please consider follow up with your Primary Care Provider.  ____________________________________________________________  Your provider has requested that you go to the basement level for lab work before leaving today. Press "B" on the elevator. The lab is located at the first door on the left as you exit the elevator.  ____________________________________________________________  We have sent the following medications to your pharmacy for you to pick up at your convenience:  Dicyclomine 10 mg every 8 hours as needed for abdominal pain. ____________________________________________________________ No dairy products Call if symptoms worsen Follow up in office in 4-6 weeks. 06/06/2020 @11 :00  Due to recent changes in healthcare laws, you may see the results of your imaging and laboratory studies on MyChart before your provider has had a chance to review them.  We understand that in some cases there may be results that are confusing or concerning to you. Not all laboratory results come back in the same time frame and the provider may be waiting for multiple results in order to interpret others.  Please give [place] 48 hours in order for your provider to thoroughly review all the results before contacting the office for clarification of your results.   Thank you for choosing [facility] Noralyn Pick, CRNP

## 2020-05-09 ENCOUNTER — Other Ambulatory Visit: Payer: BC Managed Care – PPO

## 2020-05-09 DIAGNOSIS — R1030 Lower abdominal pain, unspecified: Secondary | ICD-10-CM

## 2020-05-09 DIAGNOSIS — R197 Diarrhea, unspecified: Secondary | ICD-10-CM

## 2020-05-09 LAB — TISSUE TRANSGLUTAMINASE, IGA: (tTG) Ab, IgA: 1 U/mL

## 2020-05-11 LAB — GI PROFILE, STOOL, PCR

## 2020-05-13 ENCOUNTER — Telehealth: Payer: Self-pay | Admitting: Nurse Practitioner

## 2020-05-13 MED ORDER — VANCOMYCIN HCL 125 MG PO CAPS: 125.0000 mg | ORAL_CAPSULE | Freq: Four times a day (QID) | ORAL | 0 refills | Status: DC

## 2020-05-13 NOTE — Telephone Encounter (Signed)
Please review patients stool specimen.

## 2020-05-13 NOTE — Telephone Encounter (Signed)
GI PCR panel reviewed notable for C. difficile toxin positive.  This in the setting of recent metronidazole for presumed diverticulitis, so will plan to treat with p.o. vancomycin as below:  -Vancomycin 125 mg p.o. 4 times daily x10 days -Start probiotic to resume 3 weeks beyond completion of antibiotics

## 2020-05-13 NOTE — Telephone Encounter (Signed)
I have went over results with patient and plan. Patient voiced understanding. Prescription sent to patients pharmacy.

## 2020-05-15 NOTE — Progress Notes (Signed)
Agree with plan RG 

## 2020-05-23 ENCOUNTER — Other Ambulatory Visit: Payer: Self-pay

## 2020-05-23 MED ORDER — VANCOMYCIN HCL 125 MG PO CAPS: 125.0000 mg | ORAL_CAPSULE | Freq: Four times a day (QID) | ORAL | 0 refills | Status: AC

## 2020-05-23 NOTE — Telephone Encounter (Signed)
Norma Lopez, pls call the patient.  I would recommend she continue vancomycin.  Please send a prescription for vancomycin 125 mg 1 p.o. 4 times daily for 14 days # 56 no refills.  Continue Florastor 1 p.o. twice daily.  She will most likely require further vancomycin taper.  As she is still having symptoms with burning and cramping discomfort, she should be seen in the office for a follow-up appointment in the next 1 to 2 weeks, earlier if her symptoms worsen. THX

## 2020-06-06 ENCOUNTER — Ambulatory Visit (INDEPENDENT_AMBULATORY_CARE_PROVIDER_SITE_OTHER): Payer: BC Managed Care – PPO | Admitting: Nurse Practitioner

## 2020-06-06 ENCOUNTER — Encounter: Payer: Self-pay | Admitting: Nurse Practitioner

## 2020-06-06 VITALS — BP 128/82 | HR 70 | Ht 65.0 in | Wt 239.1 lb

## 2020-06-06 DIAGNOSIS — Z86010 Personal history of colonic polyps: Secondary | ICD-10-CM | POA: Diagnosis not present

## 2020-06-06 DIAGNOSIS — A0472 Enterocolitis due to Clostridium difficile, not specified as recurrent: Secondary | ICD-10-CM

## 2020-06-06 MED ORDER — VANCOMYCIN HCL 125 MG PO CAPS: ORAL_CAPSULE | ORAL | 0 refills | Status: DC

## 2020-06-06 NOTE — Progress Notes (Signed)
06/06/2020 Norma Lopez [id number] 12-06-1961   Chief Complaint: C. Diff follow up   History of Present Illness:  Norma Lopez is a 59 year old female with a past medical history of anxiety, depression, asthma, COPD, sleep apnea, sarcoidosis diagnosed in 2009 on MTX, previously on Remicade in 2018 - 02/2019 and started on Humira  02/2020, GERD, diverticulitis x 2 in 2014/2015 and colon polyps. Past total hysterectomy. S/P left knee medial and lateral meniscus tears 02/26/2020. I saw the patient in the office on 05/08/2020 with diarrhea and LLQ abdominal pain. CBC,  tTg and IgA levels were normal. A GI pathogen panel was positive for C. Diff. She was prescribed Vancomycin 125mg  po qid x 14 days and Florastor 1 po bid started on 05/13/2020. Her diarrhea improved but she continued to have lower abdominal burning pain and loose stools on day # 13 on Vanco/Florastor therefore an extended course of Vancomycin 125 mg qid x 2 additional weeks was prescribed. She presents today for further follow up. She is taking Bentyl once daily as needed for LLQ with improvement.  No significant abdominal pain.  She continue to have a soft formed stool in the morning then 4 to 5 subsequent stools later in the day are looser. She was passing 8 to 10 loose BMs prior to starting Vancomycin. She is taking Florastor 2 capsules by mouth twice daily.  She is eating a low residue diet.    CBC Latest Ref Rng & Units 05/08/2020 04/30/2020 02/16/2020  WBC 4.0 - 10.5 K/uL 3.7(L) 4.1 4.7  Hemoglobin 12.0 - 15.0 g/dL 14.7 14.4 14.4  Hematocrit 36 - 46 % 44.2 42.8 43.9  Platelets 150 - 400 K/uL 290.0 307.0 286  CRP < 1.0.   CTAP with contrast 05/01/2020 (premedicated with Prednisone 50mg  po 13, 7 and 1 hr before CT  and Benadryl 25po x 1): Mild left colonic diverticulosis, without evidence of diverticulitis. No evidence of bowel obstruction. Normal appendix. Status post cholecystectomy and hysterectomy. No CT findings to account  for the patient's left lower quadrant abdominal pain.  Colonoscopy 05/2017:4 mm colonic polyps/ppolypectomy (TA), pancolonic diverticulosis  EGD 06/2008:Healed distal esophageal erosions,small HH,irregular Z-line with neg bxfor Barrett's, mild gastritis.S/P dil48Fr. Neg SB Bx for celiac, neg CLO, neg eso Bx.     Current Medications, Allergies, Past Medical History, Past Surgical History, Family History and Social History were reviewed in [employer] record.   Physical Exam: There were no vitals taken for this visit. General: Well developed 59 year old female in no acute distress.  Lungs: Clear throughout to auscultation. Heart: Regular rate and rhythm, no murmur. Abdomen: Soft, nontender and nondistended. No masses or hepatomegaly. Normal bowel sounds x 4 quadrants.  Rectal: Deferred.  Musculoskeletal: Symmetrical with no gross deformities. Extremities: No edema. Neurological: Alert oriented x 4. No focal deficits.  Psychological: Alert and cooperative. Normal mood and affect  Assessment and Recommendations:  1. C. Diff diarrhea, slowly resolving on a prolonged course of Vancomycin. Initially prescribed Vancomycin 125mg  po quid x 14 days, day # 13 she continued to have loose stools and some lower abdominal cramping. A 2nd course of Vancomycin 125mg  po qid was prescribed which she will complete tomorrow. -Vanco taper as follow: Vanco 125mg  one po tid x 1 week then Vanco 125mg  one po bid x 1 week then Vanco 125mg  one po daily then stop Reduce Florastor 1 po bid -No dairy products -Ok to continue Bentyl 10mg  po bid PRN -  Pt to call our office if her symptoms worsen  -Follow up as needed   2. History of tubular adenomatous colon polyps. Last colonoscopy 05/2017  3. History of Sarcoidosis on MTX and Humira

## 2020-06-06 NOTE — Patient Instructions (Addendum)
If you are age 58 or older, your body mass index should be between 23-30. Your Body mass index is 39.79 kg/m. If this is out of the aforementioned range listed, please consider follow up with your Primary Care Provider.  If you are age 65 or younger, your body mass index should be between 19-25. Your Body mass index is 39.79 kg/m. If this is out of the aformentioned range listed, please consider follow up with your Primary Care Provider.   We have sent the following medications to your pharmacy for you to pick up at your convenience: Vancomycin taper  1 capsule three times daily for 7 days 1 capsule two times daily for 7 days 1 capsule daily for 7 days.   Decrease florastor to 1 twice a day. It is okay to add back your Bacterial probiotic daily. Stop eating yogart Call the office if your symptoms worsen  Due to recent changes in healthcare laws, you may see the results of your imaging and laboratory studies on MyChart before your provider has had a chance to review them.  We understand that in some cases there may be results that are confusing or concerning to you. Not all laboratory results come back in the same time frame and the provider may be waiting for multiple results in order to interpret others.  Please give [place] 48 hours in order for your provider to thoroughly review all the results before contacting the office for clarification of your results.   Thank you for choosing [facility] Noralyn Pick, CRNP

## 2020-06-09 NOTE — Progress Notes (Signed)
Agree with plan RG 

## 2020-07-10 ENCOUNTER — Other Ambulatory Visit: Payer: Self-pay | Admitting: Orthopedic Surgery

## 2020-07-10 ENCOUNTER — Telehealth: Payer: Self-pay | Admitting: Nurse Practitioner

## 2020-07-10 DIAGNOSIS — R609 Edema, unspecified: Secondary | ICD-10-CM

## 2020-07-10 DIAGNOSIS — M25562 Pain in left knee: Secondary | ICD-10-CM

## 2020-07-10 DIAGNOSIS — M2530 Other instability, unspecified joint: Secondary | ICD-10-CM

## 2020-07-10 NOTE — Telephone Encounter (Signed)
Dr. Lyndel Safe, Pt recently treated for C. Diff. Pls see patient's my chart msg today. She stopped her 2nd course of Vanco with prolonged taper 5 days ago with recurrence of diarrhea. She has been on [employer]. Do you advise a repeat prolonged course of Vanco or try Dificid?

## 2020-07-10 NOTE — Telephone Encounter (Signed)
Looks like recurrence of C. Difficile  Lets try Dificid 200 mg p.o. twice daily x 10 days  RG

## 2020-07-11 ENCOUNTER — Other Ambulatory Visit: Payer: Self-pay

## 2020-07-11 MED ORDER — DIFICID 200 MG PO TABS: 200.0000 mg | ORAL_TABLET | Freq: Two times a day (BID) | ORAL | 0 refills | Status: AC

## 2020-07-11 NOTE — Telephone Encounter (Signed)
Dr.Gupta, good catch by pharmacy, pt has allergy to Azithromycin (Macrolide) with reaction of palpitations therefore Dificid RX on hold.  Do you advised Vanco and Flagyl simultaneously? Or Repeat vanco with further prolonged taper?

## 2020-07-11 NOTE — Telephone Encounter (Signed)
I have contacted the patient. She is advised of the plan. Discussed maintaining her hydration. She has an allergy listed to Azithromycin - palpitations.  The pharmacy may hold the prescription until cleared by you or Dr Lyndel Safe.  She aware her insurance may require a prior authorization on this medication.

## 2020-07-11 NOTE — Telephone Encounter (Signed)
Beth, pls contact patient and let her know Dr. Lyndel Safe recommends she take Dificid for her recurrent C. Diff diarrhea. Pls send rx for Dificid 200mg  one tab po bid x 10 days, # 20, no refills. Pt to follow up in office 2 to 3 weeks, call if sx worsen. Thx

## 2020-07-15 NOTE — Telephone Encounter (Signed)
Noted, appreciate the update  

## 2020-07-15 NOTE — Telephone Encounter (Signed)
Lets just do vancomycin pulse therapy for now RG

## 2020-07-15 NOTE — Telephone Encounter (Signed)
Beth, please contact the patient. Let her know I have discussed her treatment with Dr. Lyndel Safe. Please cancel prior RX for Dificid.  I am prescribing the higher dose of Vancomycin for this course as follows: Vancomycin 250mg  one po qid x 2 weeks then Vanco 250mg  one po tid x 7 days then Vanco 250mg  po bid x 7 days then Vanco 250mg  one po daily x 7 days then Vanco 250mg  one po every third day x 6 weeks. Pt to stay on Florastor 1 po bid. # 110 tabs no refills. Patient to schedule office appointment in 4 to 5 weeks. Pt to call office if diarrhea worsens or does not improve. Thx

## 2020-07-15 NOTE — Telephone Encounter (Signed)
Spoke with the patient.  Pharmacy dispensed. She is taking the Dificid without any issues. She is seeing some improvement in her symptoms. Thanks Dr Lyndel Safe and [facility] for the good care.

## 2020-07-20 ENCOUNTER — Other Ambulatory Visit: Payer: Self-pay | Admitting: Gastroenterology

## 2020-08-03 ENCOUNTER — Other Ambulatory Visit: Payer: BC Managed Care – PPO

## 2020-10-08 NOTE — Progress Notes (Signed)
10/08/2020 Norma Lopez [id number] 05/19/1961   Chief Complaint: Rectal pain and bleeding   History of Present Illness:  Norma Lopez is a 59 year old female with a past medical history of anxiety, depression, asthma, COPD, sleep apnea, sarcoidosisdiagnosed in 2009 on MTX, previously on Remicade in 2018 - 02/2019 and started on Humira 02/2020, GERD, diverticulitis x 2 in 2014 and 2015and colon polyps. Past total hysterectomy. S/P left knee medial and lateral meniscus tears3/12/2019.   She had LLQ abdominal pain and C. Diff diarrhea in May.  She was prescribed Vancomycin 125mg  po qid x 14 days and Florastor 1 po bid started on 05/13/2020. Her diarrhea improved but she continued to have lower abdominal burning pain and loose stools on day # 13 on Vanco/Florastor therefore an extended course of Vancomycin 125 mg qid x 2 additional weeks was prescribed. She was seen in our office on 06/06/2020 for follow up. At that time, her LLQ pain was significantly improved but she continued to have a soft formed stool in the morning then 4 to 5 subsequent stools later in the day are looser. She was prescribed Vano taper 125mg  one po tid x 1 week then Vanco 125mg  one po bid x 1 week then Vanco 125mg  one po daily then stop. Her diarrhea stopped while she was on Vanco but recurred 5 days after her last dose of Vanco was taken. She was then prescribed Dificid 200mg  po bid x 10 days on 07/10/2020. Her diarrhea resolved after taking Dificid. However, about one month ago she noticed her stools were a little looser but not watery or urgent as when she had active C. Diff. She stopped her Metformin 2 weeks ago and her loose stools abated. She is passing 3 to 4 softly formed stool 2 to 3 times daily. On 09/30/2020 she was stepping up into her vehicle and she developed pain to her perineum/anal area. Later that night, she had increased rectal pain and stated her "hemorrhoid popped out". The next day she had pain down the back  of her left left so she went to urgent care. She reported having an xray of her leg which was normal. Two days ago, she developed bright red rectal bleeding. She described seeing a moderate amount of bright red blood on the toilet tissue and in the toilet water. She applied pressure to her anal area and the bleeding lessened. Her rectal pain also diminished. Today, she saw a small amount of blood on the toilet tissue. Her most recent colonoscopy was in 2018, see results below.   Colonoscopy 05/2017:4 mm colonic polyps/ppolypectomy (TA), pancolonic diverticulosis  EGD 06/2008:Healed distal esophageal erosions,small HH,irregular Z-line with neg bxfor Barrett's, mild gastritis.S/P dil48Fr. Neg SB Bx for celiac, neg CLO, neg eso Bx   CBC Latest Ref Rng & Units 05/08/2020 04/30/2020 02/16/2020  WBC 4.0 - 10.5 K/uL 3.7(L) 4.1 4.7  Hemoglobin 12.0 - 15.0 g/dL 14.7 14.4 14.4  Hematocrit 36 - 46 % 44.2 42.8 43.9  Platelets 150 - 400 K/uL 290.0 307.0 286   CMP Latest Ref Rng & Units 04/30/2020 02/16/2020 11/04/2015  Glucose 70 - 99 mg/dL 93 96 156(H)  BUN 6 - 23 mg/dL 16 18 15   Creatinine 0.40 - 1.20 mg/dL 0.88 0.74 0.98  Sodium 135 - 145 mEq/L 138 139 142  Potassium 3.5 - 5.1 mEq/L 3.9 3.9 3.5  Chloride 96 - 112 mEq/L 105 104 109  CO2 19 - 32 mEq/L 27 28 26   Calcium  8.4 - 10.5 mg/dL 8.7 9.2 8.9  Total Protein 6.0 - 8.3 g/dL 6.6 - 6.6  Total Bilirubin 0.2 - 1.2 mg/dL 0.3 - 0.8  Alkaline Phos 39 - 117 U/L 60 - 64  AST 0 - 37 U/L 19 - 23  ALT 0 - 35 U/L 20 - 22   Current Outpatient Medications on File Prior to Visit  Medication Sig Dispense Refill  . acetaminophen (TYLENOL) 500 MG tablet Take 500-1,000 mg by mouth every 6 (six) hours as needed (for pain.).    . Adalimumab 40 MG/0.4ML PSKT Inject 40 mg into the skin. Patient reports once weekly injections    . albuterol (ACCUNEB) 0.63 MG/3ML nebulizer solution Take 0.63 mg by nebulization every 6 (six) hours as needed for wheezing.    Marland Kitchen albuterol  (PROVENTIL HFA;VENTOLIN HFA) 108 (90 BASE) MCG/ACT inhaler Inhale 2 puffs into the lungs every 6 (six) hours as needed for wheezing or shortness of breath.     . Armodafinil 250 MG tablet Take 250 mg by mouth daily.    . busPIRone (BUSPAR) 10 MG tablet Take 15 mg by mouth in the morning and at bedtime.    . Carboxymethylcellulose Sod PF (REFRESH PLUS) 0.5 % SOLN Place 1 drop into both eyes at bedtime.    . Cholecalciferol (VITAMIN D-3) 125 MCG (5000 UT) TABS Take 5,000 Units by mouth daily.    . cyclobenzaprine (FLEXERIL) 10 MG tablet Take 10 mg by mouth 3 (three) times daily.    Marland Kitchen desvenlafaxine (PRISTIQ) 100 MG 24 hr tablet Take 1 tablet (100 mg total) by mouth daily. 30 tablet 0  . DEXILANT 60 MG capsule TAKE 1 CAPSULE BY MOUTH EVERY DAY 90 capsule 2  . ELDERBERRY PO Take 1 tablet by mouth daily. (Sambucol) With C & Zinc    . EQUETRO 100 MG CP12 12 hr capsule Take 100 mg by mouth at bedtime.    . EQUETRO 300 MG CP12 Take 1 capsule by mouth at bedtime.    Marland Kitchen estradiol (CLIMARA - DOSED IN MG/24 HR) 0.075 mg/24hr patch Place 1 patch (0.075 mg total) onto the skin once a week. Please specify directions, refills and quantity (Patient taking differently: Place 0.075 mg onto the skin every 14 (fourteen) days. Please specify directions, refills and quantity) 12 patch 4  . estradiol (ESTRACE) 0.1 MG/GM vaginal cream APPLY 1/4 APPLICATOR VAGINALLY and A THIN APPLICATION TO VULVA TWICE WEEKLY (Patient taking differently: Place 1.15 Applicatorfuls vaginally See admin instructions. APPLY 1/4 APPLICATOR VAGINALLY and A THIN APPLICATION TO VULVA TWICE WEEKLY) 126 g 3  . famotidine (PEPCID) 20 MG tablet Take 1 tablet (20 mg total) by mouth daily. 30 tablet 6  . Fluticasone Furoate 100 MCG/ACT AEPB Inhale 1 puff into the lungs at bedtime. ARNUITY ELLIPTA    . L-Methylfolate 15 MG TABS Take 15 mg by mouth at bedtime.     Marland Kitchen lisinopril (ZESTRIL) 10 MG tablet Take 10 mg by mouth at bedtime.     Marland Kitchen loratadine (CLARITIN)  10 MG tablet Take 10 mg by mouth at bedtime.    . methotrexate 50 MG/2ML injection Inject 7.5 mg into the skin every Sunday.   3  . metoprolol succinate (TOPROL-XL) 25 MG 24 hr tablet Take 25 mg by mouth at bedtime.    Marland Kitchen nystatin (MYCOSTATIN/NYSTOP) powder Apply 1 application topically as needed (yeast (skin folds)).     . Omega-3 Fatty Acids (FISH OIL ULTRA) 1400 MG CAPS Take 1,400 mg by mouth daily.    Marland Kitchen  OXYGEN Inhale 2 L into the lungs at bedtime.     . pramipexole (MIRAPEX) 1 MG tablet Take 1 mg by mouth at bedtime.    . predniSONE (STERAPRED UNI-PAK 21 TAB) 10 MG (21) TBPK tablet Take by mouth as directed.    . Probiotic Product (DIGESTIVE ADVANTAGE PO) Take 1 capsule by mouth daily.    Keene Breath COQ10/UBIQUINOL/MEGA 100 MG CAPS Take 100 mg by mouth daily.    . rosuvastatin (CRESTOR) 10 MG tablet Take 10 mg by mouth every Monday, Wednesday, and Friday at 8 PM.   1  . sodium chloride (OCEAN) 0.65 % SOLN nasal spray Place 1 spray into both nostrils at bedtime.    . TURMERIC PO Take 1 capsule by mouth daily.    Marland Kitchen AUVI-Q 0.3 MG/0.3ML SOAJ injection Use as directed for life-threatening allergic reaction. (Patient not taking: Reported on 10/09/2020) 4 Device 1  . diphenhydrAMINE (BENADRYL) 25 MG tablet Take 25 mg by mouth every 6 (six) hours as needed (allergic reactions.). (Patient not taking: Reported on 10/09/2020)     No current facility-administered medications on file prior to visit.    Allergies  Allergen Reactions  . Gadolinium Itching, Rash, Shortness Of Breath, Swelling and Tinitus  . Latex Itching, Rash and Other (See Comments)    Blisters    . Contrast Media [Iodinated Diagnostic Agents] Itching    (MRI CONTRAST ONLY)  . Tape Rash    redness   . Oxycodone Hcl Itching  . Azithromycin Palpitations  . Bentyl [Dicyclomine Hcl] Palpitations  . Shellfish Allergy Rash    SHRIMP  . Symbicort [Budesonide-Formoterol Fumarate] Palpitations    Tachycardia  . Wellbutrin [Bupropion]  Palpitations    Tachycardia     Current Medications, Allergies, Past Medical History, Past Surgical History, Family History and Social History were reviewed in [employer] record.   Review of Systems:   Constitutional: Negative for fever, sweats, chills or weight loss.  Respiratory: Negative for shortness of breath.   Cardiovascular: Negative for chest pain, palpitations and leg swelling.  Gastrointestinal: See HPI.  Musculoskeletal: + recent left leg pain.   Neurological: Negative for dizziness, headaches or paresthesias.    Physical Exam: BP (!) 146/86 (BP Location: Left Arm, Patient Position: Sitting, Cuff Size: Normal)   Pulse 80   Ht 5' 4.75" (1.645 m) Comment: height measured without shoes  Wt 244 lb 2 oz (110.7 kg)   BMI 40.94 kg/m   General: Well developed 59 year old female in no acute distress. Head: Normocephalic and atraumatic. Eyes: No scleral icterus. Conjunctiva pink .  Lungs: Clear throughout to auscultation. Heart: Regular rate and rhythm, no murmur. Abdomen: Soft, nontender and nondistended. No masses or hepatomegaly. Normal bowel sounds x 4 quadrants.  Rectal: External hemorrhoids without thrombosis. Posterior external hemorrhoid with a posterior anal fissure which is gaping open approximately 22mm, tender and oozing a small amount of bright red blood. Internal hemorrhoids without prolapse. Soft brown stool in the rectal vault.  Musculoskeletal: Symmetrical with no gross deformities. Extremities: No edema. Neurological: Alert oriented x 4. No focal deficits.  Psychological: Alert and cooperative. Normal mood and affect  Assessment and Recommendations:  77. 59 year old female with rectal bleeding secondary to a posterior anal fissure. External hemorrhoids without thrombosis or active bleeding.  -Diltiazem 2%/ Lidocaine 2% fissure cream apply a small amount inside/outside anal area tid x 6 weeks -Sitz bath -Call office if symptoms  worsen -Follow up with Dr. Lyndel Safe in 6  weeks  2. History of colon polyps. Last colonoscopy was 05/2017, one tubular adenomatous polyp was removed. -Dr. Lyndel Safe to verify next colonoscopy date in setting of rectal bleeding/anal fissure/TA polyp in 2018   3. 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 IBS symptoms, improved after Metformin discontinued

## 2020-10-09 ENCOUNTER — Encounter: Payer: Self-pay | Admitting: Nurse Practitioner

## 2020-10-09 ENCOUNTER — Ambulatory Visit (INDEPENDENT_AMBULATORY_CARE_PROVIDER_SITE_OTHER): Payer: BC Managed Care – PPO | Admitting: Nurse Practitioner

## 2020-10-09 ENCOUNTER — Other Ambulatory Visit (INDEPENDENT_AMBULATORY_CARE_PROVIDER_SITE_OTHER): Payer: BC Managed Care – PPO

## 2020-10-09 VITALS — BP 146/86 | HR 80 | Ht 64.75 in | Wt 244.1 lb

## 2020-10-09 DIAGNOSIS — K602 Anal fissure, unspecified: Secondary | ICD-10-CM

## 2020-10-09 DIAGNOSIS — K644 Residual hemorrhoidal skin tags: Secondary | ICD-10-CM

## 2020-10-09 LAB — CBC
HCT: 42.6 % (ref 36.0–46.0)
Hemoglobin: 14.4 g/dL (ref 12.0–15.0)
MCHC: 33.7 g/dL (ref 30.0–36.0)
MCV: 88.1 fl (ref 78.0–100.0)
Platelets: 317 10*3/uL (ref 150.0–400.0)
RBC: 4.84 Mil/uL (ref 3.87–5.11)
RDW: 14.2 % (ref 11.5–15.5)
WBC: 6.3 10*3/uL (ref 4.0–10.5)

## 2020-10-09 LAB — C-REACTIVE PROTEIN: CRP: <1 mg/dL (ref 0.5–20.0)

## 2020-10-09 MED ORDER — AMBULATORY NON FORMULARY MEDICATION: 1 refills | Status: DC

## 2020-10-09 NOTE — Patient Instructions (Addendum)
If you are age 58 or older, your body mass index should be between 23-30. Your Body mass index is 40.94 kg/m. If this is out of the aforementioned range listed, please consider follow up with your Primary Care Provider.  If you are age 34 or younger, your body mass index should be between 19-25. Your Body mass index is 40.94 kg/m. If this is out of the aformentioned range listed, please consider follow up with your Primary Care Provider.   Your provider has requested that you go to the basement level for lab work before leaving today. Press "B" on the elevator. The lab is located at the first door on the left as you exit the elevator.  We have sent a prescription for Diltiazem 2% gel to [facility] for you. Using your index finger, you should apply a small amount of medication inside the rectum up to your first knuckle/joint twice daily x 4 weeks.  [facility]'s information is below: Address: [address] [place], [place] [address]  [phone]  *Please DO NOT go directly from our office to pick up this medication! Give the pharmacy 1 day to process the prescription as this is compounded and takes time to make.  Use Imodium 1/2 tablet as needed  Do Sitz baths for 10 minutes twice a day for 7 days,  Call the office if your symptoms get worse.  You have been scheduled to follow up in [facility] with Dr. Lyndel Safe on December 03, 2020 at 10:40 am.

## 2020-10-09 NOTE — Progress Notes (Signed)
Excellent plan We will determine interval of rpt colonoscopy at follow-up visit RG

## 2020-10-24 ENCOUNTER — Other Ambulatory Visit: Payer: Self-pay | Admitting: Gastroenterology

## 2020-11-27 HISTORY — PX: OTHER SURGICAL HISTORY: SHX169

## 2020-12-02 DIAGNOSIS — S76312A Strain of muscle, fascia and tendon of the posterior muscle group at thigh level, left thigh, initial encounter: Secondary | ICD-10-CM

## 2020-12-02 HISTORY — DX: Strain of muscle, fascia and tendon of the posterior muscle group at thigh level, left thigh, initial encounter: S76.312A

## 2020-12-03 ENCOUNTER — Ambulatory Visit: Payer: BC Managed Care – PPO | Admitting: Gastroenterology

## 2020-12-03 ENCOUNTER — Other Ambulatory Visit: Payer: Self-pay

## 2020-12-03 DIAGNOSIS — D869 Sarcoidosis, unspecified: Secondary | ICD-10-CM | POA: Insufficient documentation

## 2020-12-03 DIAGNOSIS — R112 Nausea with vomiting, unspecified: Secondary | ICD-10-CM | POA: Insufficient documentation

## 2020-12-03 DIAGNOSIS — G2581 Restless legs syndrome: Secondary | ICD-10-CM | POA: Insufficient documentation

## 2020-12-03 DIAGNOSIS — K591 Functional diarrhea: Secondary | ICD-10-CM | POA: Insufficient documentation

## 2020-12-03 DIAGNOSIS — G43909 Migraine, unspecified, not intractable, without status migrainosus: Secondary | ICD-10-CM | POA: Insufficient documentation

## 2020-12-03 DIAGNOSIS — T7840XA Allergy, unspecified, initial encounter: Secondary | ICD-10-CM | POA: Insufficient documentation

## 2020-12-03 DIAGNOSIS — F410 Panic disorder [episodic paroxysmal anxiety] without agoraphobia: Secondary | ICD-10-CM | POA: Insufficient documentation

## 2020-12-03 DIAGNOSIS — E119 Type 2 diabetes mellitus without complications: Secondary | ICD-10-CM | POA: Insufficient documentation

## 2020-12-03 DIAGNOSIS — Z800 Family history of malignant neoplasm of digestive organs: Secondary | ICD-10-CM | POA: Insufficient documentation

## 2020-12-03 DIAGNOSIS — J449 Chronic obstructive pulmonary disease, unspecified: Secondary | ICD-10-CM | POA: Insufficient documentation

## 2020-12-03 DIAGNOSIS — Z8709 Personal history of other diseases of the respiratory system: Secondary | ICD-10-CM | POA: Insufficient documentation

## 2020-12-03 DIAGNOSIS — Z9889 Other specified postprocedural states: Secondary | ICD-10-CM | POA: Insufficient documentation

## 2020-12-03 DIAGNOSIS — F4310 Post-traumatic stress disorder, unspecified: Secondary | ICD-10-CM | POA: Insufficient documentation

## 2020-12-03 DIAGNOSIS — G709 Myoneural disorder, unspecified: Secondary | ICD-10-CM | POA: Insufficient documentation

## 2020-12-03 DIAGNOSIS — K589 Irritable bowel syndrome without diarrhea: Secondary | ICD-10-CM | POA: Insufficient documentation

## 2020-12-04 ENCOUNTER — Other Ambulatory Visit: Payer: Self-pay

## 2020-12-04 ENCOUNTER — Encounter: Payer: Self-pay | Admitting: Cardiology

## 2020-12-04 ENCOUNTER — Ambulatory Visit (INDEPENDENT_AMBULATORY_CARE_PROVIDER_SITE_OTHER): Payer: BC Managed Care – PPO | Admitting: Cardiology

## 2020-12-04 VITALS — BP 138/72 | HR 63 | Ht 65.0 in | Wt 248.0 lb

## 2020-12-04 DIAGNOSIS — I493 Ventricular premature depolarization: Secondary | ICD-10-CM | POA: Diagnosis not present

## 2020-12-04 DIAGNOSIS — I10 Essential (primary) hypertension: Secondary | ICD-10-CM

## 2020-12-04 DIAGNOSIS — R0789 Other chest pain: Secondary | ICD-10-CM

## 2020-12-04 DIAGNOSIS — R0600 Dyspnea, unspecified: Secondary | ICD-10-CM

## 2020-12-04 DIAGNOSIS — R0609 Other forms of dyspnea: Secondary | ICD-10-CM

## 2020-12-04 HISTORY — DX: Other chest pain: R07.89

## 2020-12-04 NOTE — Progress Notes (Signed)
Cardiology Office Note:    Date:  12/04/2020   ID:  Norma Lopez, DOB November 08, 1961, MRN [id number]  PCP:  Verdell Carmine., MD  Cardiologist:  Jenne Campus, MD    Referring MD: Verdell Carmine., MD   Chief Complaint  Patient presents with  . Medical Clearance  . Shortness of Breath    History of Present Illness:    Norma Lopez is a 59 y.o. female complex past medical history which include sarcoidosis, I have been managing her for PVCs, likely due to PVCs were always benign.  Recently she started having some problems for several she develops much more shortness of breath than usual.  She did talk to her sarcoidosis specialist and feeling is that could be related to her heart.  She gained some weight and she blames her shortness of breath on that.  She also complained of having some chest pain does not typically not exercise related and look very atypical.  In terms of palpitations still have some but does not bother her too much.  Just to make the situation will be more complicated she have torn hamstring and required surgery for it.  So the purpose of today's visit is to try to figure out why she is getting short of breath is there is anything related to her heart as well as to make sure she is stable to proceed with the surgery.  Past Medical History:  Diagnosis Date  . ADD (attention deficit disorder with hyperactivity)   . Adnexal mass 10/15/2015  . Age-related nuclear cataract of both eyes 01/13/2016  . Allergic rhinitis   . ALLERGIC RHINITIS 06/11/2008   Qualifier: Diagnosis of  By: Annamaria Boots MD, Clinton D   . Allergy   . Anxiety and depression   . Arthropathy of cervical facet joint 01/30/2019   Formatting of this note might be different from the original. Added automatically from request for surgery [phone]  . Asthma   . ASTHMA 04/20/2008   Qualifier: Diagnosis of  By: Joya Gaskins MD, Burnett Harry   . Bilateral carpal tunnel syndrome 01/13/2016  . Chronic dryness of both eyes 01/13/2016   . Cognitive complaints with normal neuropsychological exam 01/13/2016  . Complete detachment of hamstring muscle   . Complete rupture of left proximal hamstring tendon 12/02/2020   Formatting of this note might be different from the original. Added automatically from request for surgery [id number]  . Conjunctival concretions of both eyes 01/13/2016  . Conjunctival hemorrhage of right eye 06/17/2016  . COPD (chronic obstructive pulmonary disease) ([place])   . Cough 04/26/2008   Qualifier: Diagnosis of  By: Royal Piedra NP, Tammy    . Daytime somnolence 01/13/2016  . Diabetes ([place])   . Diabetes mellitus type 2, controlled ([place]) 01/13/2016  . Diarrhea, functional   . Diverticulosis   . Dysphagia 07/27/2016  . Dyspnea on exertion 01/13/2016  . Dysrhythmia 2015   none since 2015  . Edema 01/13/2016  . Elevated liver enzymes 11/02/2017  . Episodic tension-type headache, not intractable 03/10/2018  . Essential hypertension 10/15/2015  . Family history of colon cancer   . Fibrosis of lung ([place])   . Gait disorder 07/30/2016  . Gastroesophageal reflux disease 04/20/2008   Formatting of this note might be different from the original. Overview:  Qualifier: Diagnosis of  By: Burt Knack CMA, Cecille Rubin  . Generalized anxiety disorder 12/28/1988  . GERD 04/20/2008   Qualifier: Diagnosis of  By: Burt Knack CMA, Cecille Rubin    . GERD (gastroesophageal reflux  disease)   . History of pneumoconiosis    with ILD with nocturnal hypoxemia on 02 2 l/min  . History of varicose veins 01/13/2016  . Hyperlipidemia   . HYPERLIPIDEMIA 04/20/2008   Qualifier: Diagnosis of  By: Joya Gaskins MD, Burnett Harry   . Hypoxemia 01/13/2016  . IBS (irritable bowel syndrome)   . Immunocompromised state ([place]) 08/18/2017  . Joint pain, knee 01/13/2016  . Left groin hernia 01/13/2016  . Leukopenia 11/15/2018  . Low back pain without sciatica 07/30/2016  . Migraines   . Mild intermittent asthma 10/15/2015  . Multiple lung nodules on CT   . Neck pain 07/30/2016  . Neuromuscular  disorder ([facility])    neuropathy feet  . Nocturnal hypoxemia 04/20/2008   patient reports using CPAP machine at night Formatting of this note might be different from the original. Overview:  Qualifier: Diagnosis of  By: Annamaria Boots MD, Kasandra Knudsen  Formatting of this note might be different from the original. Overview:  Qualifier: Diagnosis of  By: Joya Gaskins MD, Christell Faith of this note might be different from the original. 2L with CPAP  . Obesity   . Oropharyngeal dysphagia   . OTHER DISEASES OF LUNG NOT ELSEWHERE CLASSIFIED 04/20/2008   Qualifier: Diagnosis of  By: Joya Gaskins MD, Burnett Harry   . Other premature beats 02/02/2011   Qualifier: Diagnosis of  By: Via LPN, Jeani Hawking    . Other vitreous opacities, right eye 06/17/2016  . Palpitations 08/26/2018  . Panic disorder   . 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 Overview:  Qualifier: Diagnosis of  By: Joya Gaskins MD, Burnett Harry  . PONV (postoperative nausea and vomiting)   . PONV (postoperative nausea and vomiting)   . Precordial chest pain 08/26/2018  . Preop cardiovascular exam 02/20/2020  . 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 Presbyopia 06/17/2016  . PTSD (post-traumatic stress disorder)   . Pulmonary fibrosis, unspecified ([place]) 01/13/2016  . Rectal incontinence   . Restless leg syndrome   . S/P left knee arthroscopy 03/05/2020  . Sarcoidosis   . Severe episode of recurrent major depressive disorder, without psychotic features ([place]) 01/13/2016  . Situational anxiety 01/15/2016  . Sleep apnea    patient reports using CPAP machine at night  . SLEEP APNEA 11/11/2008   Qualifier: Diagnosis of  By: Annamaria Boots MD, Clinton D   . Spondylosis without myelopathy or radiculopathy, thoracic region 01/30/2019   Formatting of this note might be different from the original. Added automatically from request for surgery [id number]  Formatting of this note might be different from the original.  Added automatically from request for surgery [phone] Formatting of this note might be different from the original. Added automatically from request for surgery [id number]  . Steroid-induced diabetes ([place]) 07/01/2016  . Symptomatic menopausal or female climacteric states 01/13/2016  . Thoracic back sprain 11/04/2017  . Thyroid nodule   . Ventricular premature depolarization 02/02/2011   Overview:  Overview:  Qualifier: Diagnosis of  By: Via LPN, Jeani Hawking   . Vitamin D deficiency 01/13/2016    Past Surgical History:  Procedure Laterality Date  . BREAST EXCISIONAL BIOPSY Left 1985, 2006   Benign  . CHOLECYSTECTOMY    . COLONOSCOPY  06/23/2017   Colonic polyp status post polypectomy. Pancolonic diverticulosis predominantly in the sigmoid colon'  . ESOPHAGOGASTRODUODENOSCOPY  07/25/2008   Healed esophageal erosions. Irregular Z-line suggestive of gastroesophageal reflux (biopsied to  rule out short-segment Barrett's esophagus) Mild gastritis.  Marland Kitchen KNEE ARTHROSCOPY     x3  . KNEE ARTHROSCOPY WITH MEDIAL MENISECTOMY Left 02/26/2020   Procedure: KNEE ARTHROSCOPY WITH MEDIAL MENISECTOMY;  Surgeon: Vickey Huger, MD;  Location: [facility];  Service: Orthopedics;  Laterality: Left;  . TONSILLECTOMY    . TOTAL ABDOMINAL HYSTERECTOMY  1992  . TUBAL LIGATION    . veins stripped  Left 1989    Current Medications: Current Meds  Medication Sig  . Adalimumab 40 MG/0.4ML PSKT Inject 40 mg into the skin. Patient reports once weekly injections  . albuterol (ACCUNEB) 0.63 MG/3ML nebulizer solution Take 0.63 mg by nebulization every 6 (six) hours as needed for wheezing.  Marland Kitchen albuterol (PROVENTIL HFA;VENTOLIN HFA) 108 (90 BASE) MCG/ACT inhaler Inhale 2 puffs into the lungs every 6 (six) hours as needed for wheezing or shortness of breath.   . AMBULATORY NON FORMULARY MEDICATION Medication Name: Diltiazem 2%/ Lidocaine 2% apply a small amount inside anal opening and to the external anal area three times a day for 6 weeks  .  Armodafinil 250 MG tablet Take 250 mg by mouth daily.  . busPIRone (BUSPAR) 10 MG tablet Take 15 mg by mouth in the morning and at bedtime.  . Carboxymethylcellulose Sod PF (REFRESH PLUS) 0.5 % SOLN Place 1 drop into both eyes at bedtime.  . Cholecalciferol (VITAMIN D-3) 125 MCG (5000 UT) TABS Take 5,000 Units by mouth daily.  . cyclobenzaprine (FLEXERIL) 10 MG tablet Take 10 mg by mouth 3 (three) times daily.  Marland Kitchen desvenlafaxine (PRISTIQ) 100 MG 24 hr tablet Take 1 tablet (100 mg total) by mouth daily.  Marland Kitchen DEXILANT 60 MG capsule TAKE 1 CAPSULE BY MOUTH EVERY DAY  . diphenhydrAMINE (SOMINEX) 25 MG tablet Take 25 mg by mouth daily.  Marland Kitchen EQUETRO 100 MG CP12 12 hr capsule Take 100 mg by mouth at bedtime.  . EQUETRO 300 MG CP12 Take 1 capsule by mouth at bedtime.  Marland Kitchen estradiol (CLIMARA - DOSED IN MG/24 HR) 0.075 mg/24hr patch Place 1 patch (0.075 mg total) onto the skin once a week. Please specify directions, refills and quantity (Patient taking differently: Place 0.075 mg onto the skin every 14 (fourteen) days. Please specify directions, refills and quantity)  . estradiol (ESTRACE) 0.1 MG/GM vaginal cream APPLY 1/4 APPLICATOR VAGINALLY and A THIN APPLICATION TO VULVA TWICE WEEKLY (Patient taking differently: Place 9.14 Applicatorfuls vaginally See admin instructions. APPLY 1/4 APPLICATOR VAGINALLY and A THIN APPLICATION TO VULVA TWICE WEEKLY)  . famotidine (PEPCID) 20 MG tablet TAKE 1 TABLET BY MOUTH EVERY DAY  . L-Methylfolate 15 MG TABS Take 15 mg by mouth at bedtime.   Marland Kitchen lisinopril (ZESTRIL) 20 MG tablet Take 20 mg by mouth daily.  Marland Kitchen loratadine (CLARITIN) 10 MG tablet Take 10 mg by mouth at bedtime.  . methotrexate 50 MG/2ML injection Inject 7.5 mg into the skin every Sunday.   . metoprolol succinate (TOPROL-XL) 25 MG 24 hr tablet Take 25 mg by mouth daily.  Marland Kitchen nystatin (MYCOSTATIN/NYSTOP) powder Apply 1 application topically as needed (yeast (skin folds)).   . Omega-3 Fatty Acids (FISH OIL ULTRA) 1400  MG CAPS Take 1,400 mg by mouth daily.  . ondansetron (ZOFRAN-ODT) 4 MG disintegrating tablet Take 1 tablet by mouth every 8 (eight) hours as needed.  . OXYGEN Inhale 2 L into the lungs at bedtime.   . pramipexole (MIRAPEX) 1 MG tablet Take 1 mg by mouth at bedtime.  [place] Breath COQ10/UBIQUINOL/MEGA 100 MG CAPS Take  100 mg by mouth daily.  . rosuvastatin (CRESTOR) 10 MG tablet Take 10 mg by mouth every Monday, Wednesday, and Friday at 8 PM.      Allergies:   Gadolinium, Latex, Pedi-pre tape spray [wound dressing adhesive], Tape, Oxycodone hcl, Azithromycin, Bentyl [dicyclomine hcl], Benzodiazepines, Dicyclomine, Shellfish allergy, Symbicort [budesonide-formoterol fumarate], and Wellbutrin [bupropion]   Social History   Socioeconomic History  . Marital status: Married    Spouse name: Not on file  . Number of children: Not on file  . Years of education: Not on file  . Highest education level: Not on file  Occupational History    Employer: [employer]  . Occupation: Therapist, sports  Tobacco Use  . Smoking status: Former Smoker    Years: 6.00    Quit date: 12/28/1996    Years since quitting: 23.9  . Smokeless tobacco: Never Used  . Tobacco comment: Exposed to second hand smoke  Vaping Use  . Vaping Use: Never used  Substance and Sexual Activity  . Alcohol use: No  . Drug use: No  . Sexual activity: Yes    Partners: Male    Comment: 1st intercourse- 30, partners- 64, married- 71 yrs   Other Topics Concern  . Not on file  Social History Narrative   Married with 3 children   3-4 cups caffeine per day   Exercises 2-3 times per week   Social Determinants of Health   Financial Resource Strain:   . Difficulty of Paying Living Expenses: Not on file  Food Insecurity:   . Worried About Charity fundraiser in the Last Year: Not on file  . Ran Out of Food in the Last Year: Not on file  Transportation Needs:   . Lack of Transportation (Medical): Not on file  . Lack of Transportation (Non-Medical): Not on file   Physical Activity:   . Days of Exercise per Week: Not on file  . Minutes of Exercise per Session: Not on file  Stress:   . Feeling of Stress : Not on file  Social Connections:   . Frequency of Communication with Friends and Family: Not on file  . Frequency of Social Gatherings with Friends and Family: Not on file  . Attends Religious Services: Not on file  . Active Member of Clubs or Organizations: Not on file  . Attends Archivist Meetings: Not on file  . Marital Status: Not on file     Family History: The patient's family history includes Alcohol abuse in her father; Allergies in an other family member; Anxiety disorder in her mother and another family member; Arthritis in her mother and another family member; Asthma in her sister and sister; Breast cancer (age of onset: 43) in her sister; COPD in her father and mother; Cancer in her father; Cancer (age of onset: 59) in her sister; Depression in her mother; Diabetes in her father and sister; Heart attack in her father; Heart disease in her father; Hyperlipidemia in an other family member; Obesity in an other family member; Sleep apnea in her sister. ROS:   Please see the history of present illness.    All 14 point review of systems negative except as described per history of present illness  EKGs/Labs/Other Studies Reviewed:      Recent Labs: 04/30/2020: ALT 20; BUN 16; Creatinine, Ser 0.88; Potassium 3.9; Sodium 138 10/09/2020: Hemoglobin 14.4; Platelets 317.0  Recent Lipid Panel No results found for: CHOL, TRIG, HDL, CHOLHDL, VLDL, LDLCALC, LDLDIRECT  Physical Exam:  VS:  BP 138/72 (BP Location: Right Arm, Patient Position: Sitting)   Pulse 63   Ht 5\' 5"  (1.651 m)   Wt 248 lb (112.5 kg)   SpO2 93%   BMI 41.27 kg/m     Wt Readings from Last 3 Encounters:  12/04/20 248 lb (112.5 kg)  10/09/20 244 lb 2 oz (110.7 kg)  06/06/20 239 lb 2 oz (108.5 kg)     GEN:  Well nourished, well developed in no acute  distress HEENT: Normal NECK: No JVD; No carotid bruits LYMPHATICS: No lymphadenopathy CARDIAC: RRR, no murmurs, no rubs, no gallops RESPIRATORY:  Clear to auscultation without rales, wheezing or rhonchi  ABDOMEN: Soft, non-tender, non-distended MUSCULOSKELETAL:  No edema; No deformity  SKIN: Warm and dry LOWER EXTREMITIES: no swelling NEUROLOGIC:  Alert and oriented x 3 PSYCHIATRIC:  Normal affect   ASSESSMENT:    1. Dyspnea on exertion   2. Atypical chest pain   3. Ventricular premature depolarization   4. Essential hypertension    PLAN:    In order of problems listed above:  1. Dyspnea on exertion.  She thinks is related to her weight.  However, I will ask you to have an echocardiogram done to check left ventricle ejection fraction.  Also will look at pulmonary pressure.  She does have multiple risk factors for coronary artery disease as well as some symptoms of chest pain which are worrisome and after the ischemia evaluation need to be performed.  I think the best approach will be to do coronary CT angio.  We will try to do it within the next 2 weeks if we will not be able to do it within that time we will do stress test. 2. Preop evaluation for hamstring surgery.  We will do echocardiogram to assess ejection fraction as well as stress test/coronary CT angio to rule out significant coronary disease. 3. Palpitations still present but tolerated well.  I will not change any of the medication and will continue present management. 4. Dyslipidemia: I will make arrangements for fasting lipid profile, 5. Essential hypertension blood pressure seems to be well controlled.   Medication Adjustments/Labs and Tests Ordered: Current medicines are reviewed at length with the patient today.  Concerns regarding medicines are outlined above.  No orders of the defined types were placed in this encounter.  Medication changes: No orders of the defined types were placed in this  encounter.   Signed, Park Liter, MD, [facility] 12/04/2020 10:42 AM    [place]

## 2020-12-04 NOTE — Patient Instructions (Addendum)
Medication Instructions:  Your physician recommends that you continue on your current medications as directed. Please refer to the Current Medication list given to you today.  *If you need a refill on your cardiac medications before your next appointment, please call your pharmacy*   Lab Work: Your physician recommends that you return for lab work in: 3-7 days before CT  If you have labs (blood work) drawn today and your tests are completely normal, you will receive your results only by: Marland Kitchen MyChart Message (if you have MyChart) OR . A paper copy in the mail If you have any lab test that is abnormal or we need to change your treatment, we will call you to review the results.   Testing/Procedures: Please arrive at the [facility] main entrance of [facility] at xx:xx AM (30-45 minutes prior to test start time)  [facility] [place], [place] [address] [phone]  Proceed to the [facility] Radiology Department (First Floor).  Please follow these instructions carefully (unless otherwise directed):  On the Night Before the Test: . Drink plenty of water. . Do not consume any caffeinated/decaffeinated beverages or chocolate 12 hours prior to your test. . Do not take any antihistamines 12 hours prior to your test. . If you take Metformin do not take 24 hours prior to test. .   On the Day of the Test: . Drink plenty of water. Do not drink any water within one hour of the test. . Do not eat any food 4 hours prior to the test. . You may take your regular medications prior to the test. . Take metoprolol 2 hours before CT After the Test: . Drink plenty of water. . After receiving IV contrast, you may experience a mild flushed feeling. This is normal. . On occasion, you may experience a mild rash up to 24 hours after the test. This is not dangerous. If this occurs, you can take Benadryl 25 mg and increase your fluid intake. . If you experience trouble  breathing, this can be serious. If it is severe call 911 IMMEDIATELY. If it is mild, please call our office. . If you take any of these medications: Glipizide/Metformin, Avandament, Glucavance, please do not take 48 hours after completing test.  Your physician has requested that you have an echocardiogram. Echocardiography is a painless test that uses sound waves to create images of your heart. It provides your doctor with information about the size and shape of your heart and how well your heart's chambers and valves are working. This procedure takes approximately one hour. There are no restrictions for this procedure.    Follow-Up: At [facility], you and your health needs are our priority.  As part of our continuing mission to provide you with exceptional heart care, we have created designated Provider Care Teams.  These Care Teams include your primary Cardiologist (physician) and Advanced Practice Providers (APPs -  Physician Assistants and Nurse Practitioners) who all work together to provide you with the care you need, when you need it.  We recommend signing up for the patient portal called "MyChart".  Sign up information is provided on this After Visit Summary.  MyChart is used to connect with patients for Virtual Visits (Telemedicine).  Patients are able to view lab/test results, encounter notes, upcoming appointments, etc.  Non-urgent messages can be sent to your provider as well.   To learn more about what you can do with MyChart, go to [email or web address].  Your next appointment:   2 month(s)  The format for your next appointment:   In Person  Provider:   Jenne Campus, MD   Other Instructions  Cardiac CT Angiogram A cardiac CT angiogram is a procedure to look at the heart and the area around the heart. It may be done to help find the cause of chest pains or other symptoms of heart disease. During this procedure, a substance called contrast dye is injected into the  blood vessels in the area to be checked. A large X-ray machine, called a CT scanner, then takes detailed pictures of the heart and the surrounding area. The procedure is also sometimes called a coronary CT angiogram, coronary artery scanning, or CTA. A cardiac CT angiogram allows the health care provider to see how well blood is flowing to and from the heart. The health care provider will be able to see if there are any problems, such as:  Blockage or narrowing of the coronary arteries in the heart.  Fluid around the heart.  Signs of weakness or disease in the muscles, valves, and tissues of the heart. Tell a health care provider about:  Any allergies you have. This is especially important if you have had a previous allergic reaction to contrast dye.  All medicines you are taking, including vitamins, herbs, eye drops, creams, and over-the-counter medicines.  Any blood disorders you have.  Any surgeries you have had.  Any medical conditions you have.  Whether you are pregnant or may be pregnant.  Any anxiety disorders, chronic pain, or other conditions you have that may increase your stress or prevent you from lying still. What are the risks? Generally, this is a safe procedure. However, problems may occur, including:  Bleeding.  Infection.  Allergic reactions to medicines or dyes.  Damage to other structures or organs.  Kidney damage from the contrast dye that is used.  Increased risk of cancer from radiation exposure. This risk is low. Talk with your health care provider about: ? The risks and benefits of testing. ? How you can receive the lowest dose of radiation. What happens before the procedure?  Wear comfortable clothing and remove any jewelry, glasses, dentures, and hearing aids.  Follow instructions from your health care provider about eating and drinking. This may include: ? For 12 hours before the procedure -- avoid caffeine. This includes tea, coffee, soda,  energy drinks, and diet pills. Drink plenty of water or other fluids that do not have caffeine in them. Being well hydrated can prevent complications. ? For 4-6 hours before the procedure -- stop eating and drinking. The contrast dye can cause nausea, but this is less likely if your stomach is empty.  Ask your health care provider about changing or stopping your regular medicines. This is especially important if you are taking diabetes medicines, blood thinners, or medicines to treat problems with erections (erectile dysfunction). What happens during the procedure?   Hair on your chest may need to be removed so that small sticky patches called electrodes can be placed on your chest. These will transmit information that helps to monitor your heart during the procedure.  An IV will be inserted into one of your veins.  You might be given a medicine to control your heart rate during the procedure. This will help to ensure that good images are obtained.  You will be asked to lie on an exam table. This table will slide in and out of the CT machine during the procedure.  Contrast dye will be injected into the IV. You might feel warm, or you may get a metallic taste in your mouth.  You will be given a medicine called nitroglycerin. This will relax or dilate the arteries in your heart.  The table that you are lying on will move into the CT machine tunnel for the scan.  The person running the machine will give you instructions while the scans are being done. You may be asked to: ? Keep your arms above your head. ? Hold your breath. ? Stay very still, even if the table is moving.  When the scanning is complete, you will be moved out of the machine.  The IV will be removed. The procedure may vary among health care providers and hospitals. What can I expect after the procedure? After your procedure, it is common to have:  A metallic taste in your mouth from the contrast dye.  A feeling of  warmth.  A headache from the nitroglycerin. Follow these instructions at home:  Take over-the-counter and prescription medicines only as told by your health care provider.  If you are told, drink enough fluid to keep your urine pale yellow. This will help to flush the contrast dye out of your body.  Most people can return to their normal activities right after the procedure. Ask your health care provider what activities are safe for you.  It is up to you to get the results of your procedure. Ask your health care provider, or the department that is doing the procedure, when your results will be ready.  Keep all follow-up visits as told by your health care provider. This is important. Contact a health care provider if:  You have any symptoms of allergy to the contrast dye. These include: ? Shortness of breath. ? Rash or hives. ? A racing heartbeat. Summary  A cardiac CT angiogram is a procedure to look at the heart and the area around the heart. It may be done to help find the cause of chest pains or other symptoms of heart disease.  During this procedure, a large X-ray machine, called a CT scanner, takes detailed pictures of the heart and the surrounding area after a contrast dye has been injected into blood vessels in the area.  Ask your health care provider about changing or stopping your regular medicines before the procedure. This is especially important if you are taking diabetes medicines, blood thinners, or medicines to treat erectile dysfunction.  If you are told, drink enough fluid to keep your urine pale yellow. This will help to flush the contrast dye out of your body. This information is not intended to replace advice given to you by your health care provider. Make sure you discuss any questions you have with your health care provider. Document Revised: 08/09/2019 Document Reviewed: 08/09/2019 Elsevier Patient Education  [place].   Echocardiogram An  echocardiogram is a procedure that uses painless sound waves (ultrasound) to produce an image of the heart. Images from an echocardiogram can provide important information about:  Signs of coronary artery disease (CAD).  Aneurysm detection. An aneurysm is a weak or damaged part of an artery wall that bulges out from the normal force of blood pumping through the body.  Heart size and shape. Changes in the size or shape of the heart can be associated with certain conditions, including heart failure, aneurysm, and CAD.  Heart muscle function.  Heart valve function.  Signs of a past heart attack.  Fluid  buildup around the heart.  Thickening of the heart muscle.  A tumor or infectious growth around the heart valves. Tell a health care provider about:  Any allergies you have.  All medicines you are taking, including vitamins, herbs, eye drops, creams, and over-the-counter medicines.  Any blood disorders you have.  Any surgeries you have had.  Any medical conditions you have.  Whether you are pregnant or may be pregnant. What are the risks? Generally, this is a safe procedure. However, problems may occur, including:  Allergic reaction to dye (contrast) that may be used during the procedure. What happens before the procedure? No specific preparation is needed. You may eat and drink normally. What happens during the procedure?   An IV tube may be inserted into one of your veins.  You may receive contrast through this tube. A contrast is an injection that improves the quality of the pictures from your heart.  A gel will be applied to your chest.  A wand-like tool (transducer) will be moved over your chest. The gel will help to transmit the sound waves from the transducer.  The sound waves will harmlessly bounce off of your heart to allow the heart images to be captured in real-time motion. The images will be recorded on a computer. The procedure may vary among health care  providers and hospitals. What happens after the procedure?  You may return to your normal, everyday life, including diet, activities, and medicines, unless your health care provider tells you not to do that. Summary  An echocardiogram is a procedure that uses painless sound waves (ultrasound) to produce an image of the heart.  Images from an echocardiogram can provide important information about the size and shape of your heart, heart muscle function, heart valve function, and fluid buildup around your heart.  You do not need to do anything to prepare before this procedure. You may eat and drink normally.  After the echocardiogram is completed, you may return to your normal, everyday life, unless your health care provider tells you not to do that. This information is not intended to replace advice given to you by your health care provider. Make sure you discuss any questions you have with your health care provider. Document Revised: 04/06/2019 Document Reviewed: 01/16/2017 Elsevier Patient Education  [place].

## 2020-12-04 NOTE — Addendum Note (Signed)
Addended by: Jerl Santos R on: 12/04/2020 11:01 AM   Modules accepted: Orders

## 2020-12-06 ENCOUNTER — Ambulatory Visit: Payer: BC Managed Care – PPO | Admitting: Gastroenterology

## 2020-12-10 ENCOUNTER — Telehealth (HOSPITAL_COMMUNITY): Payer: Self-pay | Admitting: *Deleted

## 2020-12-10 NOTE — Telephone Encounter (Signed)
Reaching out to patient to offer assistance regarding upcoming cardiac imaging study; pt verbalizes understanding of appt date/time, parking situation and where to check in, pre-test NPO status and medications ordered, and verified current allergies; name and call back number provided for further questions should they arise ° °Reighn Kaplan Tai RN Navigator Cardiac Imaging °[facility] °[phone] office °[phone] cell ° °

## 2020-12-11 ENCOUNTER — Other Ambulatory Visit: Payer: Self-pay | Admitting: Obstetrics & Gynecology

## 2020-12-11 ENCOUNTER — Ambulatory Visit (HOSPITAL_COMMUNITY)
Admission: RE | Admit: 2020-12-11 | Discharge: 2020-12-11 | Disposition: A | Discharge status: Home / Self Care | Payer: BC Managed Care – PPO | Source: Ambulatory Visit | Attending: Cardiology | Admitting: Cardiology

## 2020-12-11 ENCOUNTER — Other Ambulatory Visit: Payer: Self-pay

## 2020-12-11 DIAGNOSIS — E785 Hyperlipidemia, unspecified: Secondary | ICD-10-CM | POA: Diagnosis not present

## 2020-12-11 DIAGNOSIS — E119 Type 2 diabetes mellitus without complications: Secondary | ICD-10-CM | POA: Insufficient documentation

## 2020-12-11 DIAGNOSIS — I493 Ventricular premature depolarization: Secondary | ICD-10-CM | POA: Diagnosis not present

## 2020-12-11 DIAGNOSIS — J449 Chronic obstructive pulmonary disease, unspecified: Secondary | ICD-10-CM | POA: Insufficient documentation

## 2020-12-11 DIAGNOSIS — R0609 Other forms of dyspnea: Secondary | ICD-10-CM

## 2020-12-11 DIAGNOSIS — R0600 Dyspnea, unspecified: Secondary | ICD-10-CM | POA: Diagnosis not present

## 2020-12-11 DIAGNOSIS — D860 Sarcoidosis of lung: Secondary | ICD-10-CM | POA: Diagnosis not present

## 2020-12-11 DIAGNOSIS — R0789 Other chest pain: Secondary | ICD-10-CM | POA: Diagnosis not present

## 2020-12-11 DIAGNOSIS — I10 Essential (primary) hypertension: Secondary | ICD-10-CM

## 2020-12-11 DIAGNOSIS — Z1231 Encounter for screening mammogram for malignant neoplasm of breast: Secondary | ICD-10-CM

## 2020-12-11 LAB — BASIC METABOLIC PANEL
BUN/Creatinine Ratio: 17 (ref 9–23)
BUN: 15 mg/dL (ref 6–24)
CO2: 26 mmol/L (ref 20–29)
Calcium: 8.8 mg/dL (ref 8.7–10.2)
Chloride: 104 mmol/L (ref 96–106)
Creatinine, Ser: 0.87 mg/dL (ref 0.57–1.00)
GFR calc Af Amer: 85 mL/min/{1.73_m2} (ref >59)
GFR calc non Af Amer: 74 mL/min/{1.73_m2} (ref >59)
Glucose: 84 mg/dL (ref 65–99)
Potassium: 4.1 mmol/L (ref 3.5–5.2)
Sodium: 142 mmol/L (ref 134–144)

## 2020-12-11 LAB — ECHOCARDIOGRAM COMPLETE
Area-P 1/2: 2.95 cm2
S' Lateral: 3.3 cm

## 2020-12-11 NOTE — Progress Notes (Signed)
Echocardiogram 2D Echocardiogram has been performed.  Oneal Deputy Alexander Aument 12/11/2020, 8:28 AM

## 2020-12-12 ENCOUNTER — Other Ambulatory Visit: Payer: Self-pay

## 2020-12-12 ENCOUNTER — Telehealth: Payer: Self-pay | Admitting: Cardiology

## 2020-12-12 ENCOUNTER — Ambulatory Visit
Admission: RE | Admit: 2020-12-12 | Discharge: 2020-12-12 | Disposition: A | Discharge status: Home / Self Care | Payer: BC Managed Care – PPO | Source: Ambulatory Visit | Attending: Cardiology | Admitting: Cardiology

## 2020-12-12 DIAGNOSIS — I10 Essential (primary) hypertension: Secondary | ICD-10-CM

## 2020-12-12 DIAGNOSIS — R0789 Other chest pain: Secondary | ICD-10-CM | POA: Diagnosis not present

## 2020-12-12 DIAGNOSIS — R0600 Dyspnea, unspecified: Secondary | ICD-10-CM | POA: Diagnosis not present

## 2020-12-12 DIAGNOSIS — I493 Ventricular premature depolarization: Secondary | ICD-10-CM | POA: Insufficient documentation

## 2020-12-12 DIAGNOSIS — R0609 Other forms of dyspnea: Secondary | ICD-10-CM

## 2020-12-12 MED ORDER — METOPROLOL TARTRATE 5 MG/5ML IV SOLN
10.0000 mg | Freq: Once | INTRAVENOUS | Status: AC
  Administered 2020-12-12: 10 mg via INTRAVENOUS

## 2020-12-12 MED ORDER — IOHEXOL 350 MG/ML SOLN
100.0000 mL | Freq: Once | INTRAVENOUS | Status: AC | PRN
  Administered 2020-12-12: 100 mL via INTRAVENOUS

## 2020-12-12 MED ORDER — NITROGLYCERIN 0.4 MG SL SUBL
0.8000 mg | SUBLINGUAL_TABLET | Freq: Once | SUBLINGUAL | Status: AC
  Administered 2020-12-12: 0.8 mg via SUBLINGUAL

## 2020-12-12 NOTE — Telephone Encounter (Signed)
Olivia Mackie from [facility] Radiology called about CT results for the patient.

## 2020-12-12 NOTE — Progress Notes (Signed)
Patient tolerated CT well. Drank coffee and gave water to drink after CT.  Vital signs stable encourage to drink water throughout day.Reasons explained and verbalized understanding. Ambulated steady gait.

## 2020-12-12 NOTE — Telephone Encounter (Signed)
Result available in chart for review

## 2020-12-13 ENCOUNTER — Ambulatory Visit
Admission: RE | Admit: 2020-12-13 | Discharge: 2020-12-13 | Disposition: A | Discharge status: Home / Self Care | Payer: BC Managed Care – PPO | Source: Ambulatory Visit | Attending: Obstetrics & Gynecology | Admitting: Obstetrics & Gynecology

## 2020-12-13 DIAGNOSIS — Z1231 Encounter for screening mammogram for malignant neoplasm of breast: Secondary | ICD-10-CM

## 2021-01-10 ENCOUNTER — Encounter: Payer: Self-pay | Admitting: Gastroenterology

## 2021-01-10 ENCOUNTER — Other Ambulatory Visit: Payer: Self-pay

## 2021-01-10 ENCOUNTER — Ambulatory Visit (INDEPENDENT_AMBULATORY_CARE_PROVIDER_SITE_OTHER): Payer: BC Managed Care – PPO | Admitting: Obstetrics & Gynecology

## 2021-01-10 ENCOUNTER — Encounter: Payer: BC Managed Care – PPO | Admitting: Obstetrics & Gynecology

## 2021-01-10 ENCOUNTER — Ambulatory Visit (INDEPENDENT_AMBULATORY_CARE_PROVIDER_SITE_OTHER): Payer: BC Managed Care – PPO | Admitting: Gastroenterology

## 2021-01-10 ENCOUNTER — Encounter: Payer: Self-pay | Admitting: Obstetrics & Gynecology

## 2021-01-10 VITALS — BP 100/70 | HR 76 | Ht 65.5 in | Wt 248.0 lb

## 2021-01-10 VITALS — BP 132/70

## 2021-01-10 DIAGNOSIS — Z86010 Personal history of colonic polyps: Secondary | ICD-10-CM

## 2021-01-10 DIAGNOSIS — K644 Residual hemorrhoidal skin tags: Secondary | ICD-10-CM | POA: Diagnosis not present

## 2021-01-10 DIAGNOSIS — K602 Anal fissure, unspecified: Secondary | ICD-10-CM

## 2021-01-10 DIAGNOSIS — N898 Other specified noninflammatory disorders of vagina: Secondary | ICD-10-CM | POA: Diagnosis not present

## 2021-01-10 LAB — WET PREP FOR TRICH, YEAST, CLUE

## 2021-01-10 MED ORDER — DILTIAZEM GEL 2 %: 1.0000 "application " | Freq: Three times a day (TID) | CUTANEOUS | 1 refills | Status: DC

## 2021-01-10 NOTE — Patient Instructions (Signed)
[facility]'s information is below: Address: [address] [place], [place] [address]  [phone]  We have sent the following medications to your pharmacy for you to pick up at your convenience:  Diltiaxem Colace   Thank you,  Dr. Jackquline Denmark

## 2021-01-10 NOTE — Progress Notes (Signed)
Chief Complaint: FU  Referring Provider:  Verdell Carmine., MD      ASSESSMENT AND PLAN;   #1. Internal Hoids with posterior anal fissure  #2. H/O colonic polyps 05/2017. FH colon Ca (sis at age 60)  #3. History of recurrent C. difficile July 2021 (resolved after [facility])  #4. L hamstring tear s/p repair 11/2020, currently in brace and on crutches.  Plan: -Diltiazem 2%/ Lidocaine 2% fissure cream apply a small amount inside/outside anal area tid x 6 weeks -Colace 1 tab po qd -fluticasone cream 0.05% generic 30g 1 bid PR x 10 days, 2 refills -Avoid NSAIDs. -Avoid any antibiotics unless absolutely necessary. -FU in 6 months -At FU, she would consider colon. Hopefully she will be recovered from hamstring injury    HPI:    Norma Lopez is a 60 y.o. female  With multiple medical problems including H/O anxiety/depression, COPD, OSA, sarcoidosisdiagnosed in 2009 on MTX, previously on Remicade in 2018 - 02/2019 and started on Humira 02/2020, GERD, diverticulitis x 2 in 2014 and 2015, C. difficile colitis 7/2021and colon polyps. Past total hysterectomy. S/P left knee medial and lateral meniscus tears3/12/2019, recent L hamstring repair 11/2020.  For follow-up visit.  Had left hamstring tear after fall in October.  Underwent surgical repair Dec 2021.  She is currently in rehab.  Still on crutches and has a knee brace.  Main problem is that of hemorrhoids which is better over the last 2 weeks.  She does give history of constipation and straining.  No abdominal pain.  Occasional rectal pain after defecation.  No significant rectal bleeding currently.  She used diltiazem/lidocaine cream which has helped.  Would like to get some refills.  Has not been on any antibiotics recently.  No fever chills or night sweats.  Would like to hold off on colonoscopy evaluation until she gets better from Ortho standpoint.  I do agree.  Past GI procedures: Colonoscopy 05/2017:colonic  polyps/ppolypectomy (TA), pancolonic diverticulosis  EGD 06/2008:Healed distal esophageal erosions,small HH,irregular Z-line with neg bxfor Barrett's, mild gastritis.S/P dil48Fr. Neg SB Bx for celiac, neg CLO, neg eso Bx  Past Medical History:  Diagnosis Date  . ADD (attention deficit disorder with hyperactivity)   . Adnexal mass 10/15/2015  . Age-related nuclear cataract of both eyes 01/13/2016  . Allergic rhinitis   . ALLERGIC RHINITIS 06/11/2008   Qualifier: Diagnosis of  By: Annamaria Boots MD, [place] D   . Allergy   . Anxiety and depression   . Arthropathy of cervical facet joint 01/30/2019   Formatting of this note might be different from the original. Added automatically from request for surgery [phone]  . Asthma   . ASTHMA 04/20/2008   Qualifier: Diagnosis of  By: Joya Gaskins MD, Burnett Harry   . Bilateral carpal tunnel syndrome 01/13/2016  . Chronic dryness of both eyes 01/13/2016  . Cognitive complaints with normal neuropsychological exam 01/13/2016  . Complete detachment of hamstring muscle   . Complete rupture of left proximal hamstring tendon 12/02/2020   Formatting of this note might be different from the original. Added automatically from request for surgery [id number]  . Conjunctival concretions of both eyes 01/13/2016  . Conjunctival hemorrhage of right eye 06/17/2016  . COPD (chronic obstructive pulmonary disease) ([place])   . Cough 04/26/2008   Qualifier: Diagnosis of  By: Royal Piedra NP, Tammy    . Daytime somnolence 01/13/2016  . Diabetes ([place])   . Diabetes mellitus type 2, controlled ([place]) 01/13/2016  . Diarrhea, functional   .  Diverticulosis   . Dysphagia 07/27/2016  . Dyspnea on exertion 01/13/2016  . Dysrhythmia 2015   none since 2015  . Edema 01/13/2016  . Elevated liver enzymes 11/02/2017  . Episodic tension-type headache, not intractable 03/10/2018  . Essential hypertension 10/15/2015  . Family history of colon cancer   . Fibrosis of lung ([place])   . Gait disorder 07/30/2016  .  Gastroesophageal reflux disease 04/20/2008   Formatting of this note might be different from the original. Overview:  Qualifier: Diagnosis of  By: Burt Knack CMA, Cecille Rubin  . Generalized anxiety disorder 12/28/1988  . GERD 04/20/2008   Qualifier: Diagnosis of  By: Burt Knack CMA, Cecille Rubin    . GERD (gastroesophageal reflux disease)   . History of pneumoconiosis    with ILD with nocturnal hypoxemia on 02 2 l/min  . History of varicose veins 01/13/2016  . Hyperlipidemia   . HYPERLIPIDEMIA 04/20/2008   Qualifier: Diagnosis of  By: Joya Gaskins MD, Burnett Harry   . Hypoxemia 01/13/2016  . IBS (irritable bowel syndrome)   . Immunocompromised state ([place]) 08/18/2017  . Joint pain, knee 01/13/2016  . Left groin hernia 01/13/2016  . Leukopenia 11/15/2018  . Low back pain without sciatica 07/30/2016  . Migraines   . Mild intermittent asthma 10/15/2015  . Multiple lung nodules on CT   . Neck pain 07/30/2016  . Neuromuscular disorder ([facility])    neuropathy feet  . Nocturnal hypoxemia 04/20/2008   patient reports using CPAP machine at night Formatting of this note might be different from the original. Overview:  Qualifier: Diagnosis of  By: Annamaria Boots MD, Kasandra Knudsen  Formatting of this note might be different from the original. Overview:  Qualifier: Diagnosis of  By: Joya Gaskins MD, Christell Faith of this note might be different from the original. 2L with CPAP  . Obesity   . Oropharyngeal dysphagia   . OTHER DISEASES OF LUNG NOT ELSEWHERE CLASSIFIED 04/20/2008   Qualifier: Diagnosis of  By: Joya Gaskins MD, Burnett Harry   . Other premature beats 02/02/2011   Qualifier: Diagnosis of  By: Via LPN, Jeani Hawking    . Other vitreous opacities, right eye 06/17/2016  . Palpitations 08/26/2018  . Panic disorder   . 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 Overview:  Qualifier: Diagnosis of  By: Joya Gaskins MD, Burnett Harry  . PONV (postoperative nausea and vomiting)   .  PONV (postoperative nausea and vomiting)   . Precordial chest pain 08/26/2018  . Preop cardiovascular exam 02/20/2020  . 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 Presbyopia 06/17/2016  . PTSD (post-traumatic stress disorder)   . Pulmonary fibrosis, unspecified ([place]) 01/13/2016  . Rectal incontinence   . Restless leg syndrome   . S/P left knee arthroscopy 03/05/2020  . Sarcoidosis   . Severe episode of recurrent major depressive disorder, without psychotic features ([place]) 01/13/2016  . Situational anxiety 01/15/2016  . Sleep apnea    patient reports using CPAP machine at night  . SLEEP APNEA 11/11/2008   Qualifier: Diagnosis of  By: Annamaria Boots MD, [place] D   . Spondylosis without myelopathy or radiculopathy, thoracic region 01/30/2019   Formatting of this note might be different from the original. Added automatically from request for surgery [phone]  Formatting of this note might be different from the original. Added automatically from request for surgery [phone] Formatting of this note might be different from the original. Added automatically from request  for surgery [id number]  . 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 Symptomatic menopausal or female climacteric states 01/13/2016  . Thoracic back sprain 11/04/2017  . Thyroid nodule   . Ventricular premature depolarization 02/02/2011   Overview:  Overview:  Qualifier: Diagnosis of  By: Via LPN, Jeani Hawking   . Vitamin D deficiency 01/13/2016    Past Surgical History:  Procedure Laterality Date  . BREAST EXCISIONAL BIOPSY Left 1985, 2006   Benign  . CHOLECYSTECTOMY    . COLONOSCOPY  06/23/2017   Colonic polyp status post polypectomy. Pancolonic diverticulosis predominantly in the sigmoid colon'  . ESOPHAGOGASTRODUODENOSCOPY  07/25/2008   Healed esophageal erosions. Irregular Z-line suggestive of gastroesophageal reflux (biopsied to rule out short-segment Barrett's esophagus) Mild gastritis.  Marland Kitchen KNEE ARTHROSCOPY     x3  . KNEE ARTHROSCOPY WITH MEDIAL MENISECTOMY Left  02/26/2020   Procedure: KNEE ARTHROSCOPY WITH MEDIAL MENISECTOMY;  Surgeon: Vickey Huger, MD;  Location: [facility];  Service: Orthopedics;  Laterality: Left;  . TONSILLECTOMY    . TOTAL ABDOMINAL HYSTERECTOMY  1992  . TUBAL LIGATION    . veins stripped  Left 1989    Family History  Problem Relation Age of Onset  . COPD Mother   . Arthritis Mother   . Depression Mother   . Anxiety disorder Mother   . COPD Father   . Alcohol abuse Father   . Heart disease Father   . Diabetes Father   . Cancer Father        lung  . Heart attack Father   . Asthma Sister   . Cancer Sister 2       colon  . Asthma Sister   . Diabetes Sister   . Breast cancer Sister 43  . Sleep apnea Sister   . Hyperlipidemia Other   . Allergies Other   . Obesity Other   . Anxiety disorder Other   . Arthritis Other     Social History   Tobacco Use  . Smoking status: Former Smoker    Years: 6.00    Quit date: 12/28/1996    Years since quitting: 24.0  . Smokeless tobacco: Never Used  . Tobacco comment: Exposed to second hand smoke  Vaping Use  . Vaping Use: Never used  Substance Use Topics  . Alcohol use: No  . Drug use: No    Current Outpatient Medications  Medication Sig Dispense Refill  . acetaminophen (TYLENOL) 500 MG tablet Take 500-1,000 mg by mouth every 6 (six) hours as needed (for pain.).    . Adalimumab 40 MG/0.4ML PSKT Inject 40 mg into the skin. Patient reports once weekly injections    . albuterol (ACCUNEB) 0.63 MG/3ML nebulizer solution Take 0.63 mg by nebulization every 6 (six) hours as needed for wheezing.    . AMBULATORY NON FORMULARY MEDICATION Medication Name: Diltiazem 2%/ Lidocaine 2% apply a small amount inside anal opening and to the external anal area three times a day for 6 weeks 1 Tube 1  . Armodafinil 250 MG tablet Take 250 mg by mouth daily.    Marland Kitchen AUVI-Q 0.3 MG/0.3ML SOAJ injection Use as directed for life-threatening allergic reaction. 4 Device 1  . busPIRone (BUSPAR) 10 MG  tablet Take 15 mg by mouth in the morning and at bedtime.    . Carboxymethylcellulose Sod PF (REFRESH PLUS) 0.5 % SOLN Place 1 drop into both eyes at bedtime.    . Cholecalciferol (VITAMIN D-3) 125 MCG (5000 UT) TABS Take 5,000 Units by mouth daily.    Marland Kitchen  cyclobenzaprine (FLEXERIL) 10 MG tablet Take 10 mg by mouth 3 (three) times daily.    Marland Kitchen desvenlafaxine (PRISTIQ) 100 MG 24 hr tablet Take 1 tablet (100 mg total) by mouth daily. 30 tablet 0  . DEXILANT 60 MG capsule TAKE 1 CAPSULE BY MOUTH EVERY DAY 90 capsule 2  . diphenhydrAMINE (BENADRYL) 25 MG tablet Take 25 mg by mouth every 6 (six) hours as needed (allergic reactions.).    Marland Kitchen EQUETRO 100 MG CP12 12 hr capsule Take 100 mg by mouth at bedtime.    . EQUETRO 300 MG CP12 Take 1 capsule by mouth at bedtime.    Marland Kitchen estradiol (CLIMARA - DOSED IN MG/24 HR) 0.075 mg/24hr patch Place 1 patch (0.075 mg total) onto the skin once a week. Please specify directions, refills and quantity (Patient taking differently: Place 0.075 mg onto the skin every 14 (fourteen) days. Please specify directions, refills and quantity) 12 patch 4  . estradiol (ESTRACE) 0.1 MG/GM vaginal cream APPLY 1/4 APPLICATOR VAGINALLY and A THIN APPLICATION TO VULVA TWICE WEEKLY (Patient taking differently: Place [id number] Applicatorfuls vaginally See admin instructions. APPLY 1/4 APPLICATOR VAGINALLY and A THIN APPLICATION TO VULVA TWICE WEEKLY) 126 g 3  . famotidine (PEPCID) 20 MG tablet TAKE 1 TABLET BY MOUTH EVERY DAY 90 tablet 2  . Fluticasone Furoate 100 MCG/ACT AEPB Inhale 1 puff into the lungs at bedtime. ARNUITY ELLIPTA    . L-Methylfolate 15 MG TABS Take 15 mg by mouth at bedtime.     Marland Kitchen lisinopril (ZESTRIL) 20 MG tablet Take 20 mg by mouth daily.    Marland Kitchen loratadine (CLARITIN) 10 MG tablet Take 10 mg by mouth at bedtime.    . methotrexate 50 MG/2ML injection Inject 7.5 mg into the skin every Sunday.   3  . metoprolol succinate (TOPROL-XL) 25 MG 24 hr tablet Take 25 mg by mouth daily.    Marland Kitchen  nystatin (MYCOSTATIN/NYSTOP) powder Apply 1 application topically as needed (yeast (skin folds)).     . Omega-3 Fatty Acids (FISH OIL ULTRA) 1400 MG CAPS Take 1,400 mg by mouth daily.    . ondansetron (ZOFRAN-ODT) 4 MG disintegrating tablet Take 1 tablet by mouth every 8 (eight) hours as needed.    . OXYGEN Inhale 2 L into the lungs at bedtime.     . pramipexole (MIRAPEX) 1 MG tablet Take 1 mg by mouth at bedtime.    . Probiotic Product (DIGESTIVE ADVANTAGE PO) Take 1 capsule by mouth daily.    . promethazine (PHENERGAN) 25 MG tablet Take by mouth.    [place] Breath COQ10/UBIQUINOL/MEGA 100 MG CAPS Take 100 mg by mouth daily.    . rosuvastatin (CRESTOR) 10 MG tablet Take 10 mg by mouth every Monday, Wednesday, and Friday at 8 PM.   1  . sodium chloride (OCEAN) 0.65 % SOLN nasal spray Place 1 spray into both nostrils at bedtime.    . TURMERIC PO Take 1 capsule by mouth daily.    Marland Kitchen albuterol (PROVENTIL HFA;VENTOLIN HFA) 108 (90 BASE) MCG/ACT inhaler Inhale 2 puffs into the lungs every 6 (six) hours as needed for wheezing or shortness of breath.     . diphenhydrAMINE (SOMINEX) 25 MG tablet Take 25 mg by mouth daily.    Marland Kitchen ELDERBERRY PO Take 1 tablet by mouth daily. (Sambucol) With C & Zinc (Patient not taking: Reported on 12/04/2020)     No current facility-administered medications for this visit.    Allergies  Allergen Reactions  . Gadolinium Itching, Rash, Shortness  Of Breath, Swelling and Tinitus  . Latex Itching, Rash and Other (See Comments)    Blisters    . Pedi-Pre Tape Spray [Wound Dressing Adhesive] Rash    redness  . Tape Rash    redness   . Oxycodone Hcl Itching  . Azithromycin Palpitations  . Bentyl [Dicyclomine Hcl] Palpitations  . Benzodiazepines Palpitations  . Dicyclomine Palpitations  . Shellfish Allergy Rash    SHRIMP  . Symbicort [Budesonide-Formoterol Fumarate] Palpitations    Tachycardia  . Wellbutrin [Bupropion] Palpitations    Tachycardia     Review of Systems:   neg     Physical Exam:    BP 100/70   Pulse 76   Ht 5' 5.5" (1.664 m)   Wt 248 lb (112.5 kg)   BMI 40.64 kg/m  Wt Readings from Last 3 Encounters:  01/10/21 248 lb (112.5 kg)  12/04/20 248 lb (112.5 kg)  10/09/20 244 lb 2 oz (110.7 kg)   Constitutional:  Well-developed, in no acute distress. Psychiatric: Normal mood and affect. Behavior is normal. HEENT: Pupils normal.  Conjunctivae are normal. No scleral icterus. Cardiovascular: Normal rate, regular rhythm. No edema Pulmonary/chest: Effort normal and breath sounds normal. No wheezing, rales or rhonchi. Abdominal: Soft, nondistended. Nontender. Bowel sounds active throughout. There are no masses palpable. No hepatomegaly.  Incisional umbilical hernia Rectal: Small external hemorrhoids, posterior anal fissure 1 cm, no abscesses.  Brown hard stool.  Heme-negative.  Examined in presence of Magda Paganini and patient's husband. Neurological: Alert and oriented to person place and time. Skin: Skin is warm and dry. No rashes noted.  Data Reviewed: I have personally reviewed following labs and imaging studies  CBC: CBC Latest Ref Rng & Units 10/09/2020 05/08/2020 04/30/2020  WBC 4.0 - 10.5 K/uL 6.3 3.7(L) 4.1  Hemoglobin 12.0 - 15.0 g/dL 14.4 14.7 14.4  Hematocrit 36.0 - 46.0 % 42.6 44.2 42.8  Platelets 150.0 - 400.0 K/uL 317.0 290.0 307.0    CMP: CMP Latest Ref Rng & Units 12/10/2020 04/30/2020 02/16/2020  Glucose 65 - 99 mg/dL 84 93 96  BUN 6 - 24 mg/dL 15 16 18   Creatinine 0.57 - 1.00 mg/dL 0.87 0.88 0.74  Sodium 134 - 144 mmol/L 142 138 139  Potassium 3.5 - 5.2 mmol/L 4.1 3.9 3.9  Chloride 96 - 106 mmol/L 104 105 104  CO2 20 - 29 mmol/L 26 27 28   Calcium 8.7 - 10.2 mg/dL 8.8 8.7 9.2  Total Protein 6.0 - 8.3 g/dL - 6.6 -  Total Bilirubin 0.2 - 1.2 mg/dL - 0.3 -  Alkaline Phos 39 - 117 U/L - 60 -  AST 0 - 37 U/L - 19 -  ALT 0 - 35 U/L - 20 -       Radiology Studies: CT CORONARY MORPH W/CTA COR W/SCORE W/CA W/CM &/OR  WO/CM  Addendum Date: 12/12/2020   ADDENDUM REPORT: 12/12/2020 16:39 EXAM: OVER-READ INTERPRETATION  CT CHEST The following report is an over-read performed by radiologist Dr. Alvino Blood [facility] on 12/12/2020. This over-read does not include interpretation of cardiac or coronary anatomy or pathology. The coronary CTA interpretation by the cardiologist is attached. COMPARISON:  None. FINDINGS: Limited view of the lung parenchyma demonstrates multiple pulmonary nodules with a central predominance. There is peribronchovascular thickening extending from the LEFT and RIGHT hilum. The degree of nodularity is reduced from CT 11/04/2015. Airways are normal. Limited view of the mediastinum demonstrates mild mediastinal lymphadenopathy similar to comparison CT 2016. For example 20 mm RIGHT lower  paratracheal node compares to 20 mm. Esophagus normal. Limited view of the upper abdomen unremarkable. Limited view of the skeleton and chest wall is unremarkable. IMPRESSION: Extensive pulmonary nodularity and peribronchovascular thickening with mediastinal adenopathy. Some improvement in nodularity from CT 2016. Findings are consistent with reported history of sarcoidosis. These results will be called to the ordering clinician or representative by the Radiologist Assistant, and communication documented in the PACS or [employer]. Electronically Signed   By: Suzy Bouchard M.D.   On: 12/12/2020 16:39   Result Date: 12/12/2020 CLINICAL DATA:  chestpain EXAM: Cardiac/Coronary  CTA TECHNIQUE: The patient was scanned on a Siemens Somatoform go.Top scanner. FINDINGS: A retrospective scan was triggered in the descending thoracic aorta. Axial non-contrast 3 mm slices were carried out through the heart. The data set was analyzed on a dedicated work station and scored using the [place]. Gantry rotation speed was 330 msecs and collimation was .6 mm. 25mg  of metoprolol and 0.8 mg of sl NTG was given.  The 3D data set was reconstructed in 5% intervals of the 60-95 % of the R-R cycle. Diastolic phases were analyzed on a dedicated work station using MPR, MIP and VRT modes. The patient received 100 cc of contrast. Aorta:  Normal size.  No calcifications.  No dissection. Aortic Valve:  Trileaflet.  No calcifications. Coronary Arteries:  Normal coronary origin.  Right dominance. RCA is a large dominant artery that gives rise to PDA and PLA. There is no plaque. Left main is a large artery that gives rise to LAD and LCX arteries. LAD is a large vessel that has no plaque. It gives rise to a large first diagonal branch with no disease. LCX is a non-dominant artery that gives rise to one large obtuse marginal branch. There is no plaque. Other findings: Normal pulmonary vein drainage into the left atrium. Normal left atrial appendage without a thrombus. Normal size of the pulmonary artery. IMPRESSION: 1. Normal coronary calcium score of 0. Patient is low risk for coronary events. 2. Normal coronary origin with right dominance. 3. No evidence of CAD. 4. CAD-RADS 0. Consider non-atherosclerotic causes of chest pain. Electronically Signed: By: Kate Sable M.D. On: 12/12/2020 15:21   MM 3D SCREEN BREAST BILATERAL  Result Date: 12/18/2020 CLINICAL DATA:  Screening. EXAM: DIGITAL SCREENING BILATERAL MAMMOGRAM WITH TOMO AND CAD COMPARISON:  Previous exam(s). ACR Breast Density Category b: There are scattered areas of fibroglandular density. FINDINGS: There are no findings suspicious for malignancy. Images were processed with CAD. IMPRESSION: No mammographic evidence of malignancy. A result letter of this screening mammogram will be mailed directly to the patient. RECOMMENDATION: Screening mammogram in one year. (Code:SM-B-01Y) BI-RADS CATEGORY  1: Negative. Electronically Signed   By: Kristopher Oppenheim M.D.   On: 12/18/2020 09:21      Carmell Austria, MD 01/10/2021, 9:46 AM  Cc: Verdell Carmine., MD

## 2021-01-10 NOTE — Progress Notes (Signed)
    Norma Lopez 09/16/1961 [id number]        60 y.o.  G4P0013 Married  RP: Vaginal irritation with burning and odor  HPI: Pulled her left Hamstring, having to lay much more than usual.  C/O vaginal and vulvar irritation with burning and odor.  No pelvic pain.  No fever.  Urine and BMs normal.  Declines STI screen.   OB History  Gravida Para Term Preterm AB Living  4 3     1 3   SAB IAB Ectopic Multiple Live Births               # Outcome Date GA Lbr Len/2nd Weight Sex Delivery Anes PTL Lv  4 AB           3 Para           2 Para           1 Para             Past medical history,surgical history, problem list, medications, allergies, family history and social history were all reviewed and documented in the EPIC chart.   Directed ROS with pertinent positives and negatives documented in the history of present illness/assessment and plan.  Exam:  Vitals:   01/10/21 1150  BP: 132/70   General appearance:  Normal  Abdomen: Normal  Gynecologic exam: Vulva normal.  Speculum:  Cervix/Vagina normal. Mild increase in secretions.  Wet prep done.  Wet prep negative   Assessment/Plan:  60 y.o. G4P0013   1. Vaginal discharge Wet prep negative.  Patient reassured.  Will try warm soaking, loose clothing and Hydrocortisone 1% cream as needed. - WET PREP FOR TRICH, YEAST, CLUE  Princess Bruins MD, 12:11 PM 01/10/2021

## 2021-02-03 DIAGNOSIS — J449 Chronic obstructive pulmonary disease, unspecified: Secondary | ICD-10-CM

## 2021-02-03 HISTORY — DX: Chronic obstructive pulmonary disease, unspecified: J44.9

## 2021-02-13 ENCOUNTER — Other Ambulatory Visit: Payer: Self-pay

## 2021-02-13 DIAGNOSIS — R159 Full incontinence of feces: Secondary | ICD-10-CM | POA: Insufficient documentation

## 2021-02-13 DIAGNOSIS — F419 Anxiety disorder, unspecified: Secondary | ICD-10-CM | POA: Insufficient documentation

## 2021-02-13 DIAGNOSIS — G4730 Sleep apnea, unspecified: Secondary | ICD-10-CM | POA: Insufficient documentation

## 2021-02-13 DIAGNOSIS — E785 Hyperlipidemia, unspecified: Secondary | ICD-10-CM | POA: Insufficient documentation

## 2021-02-13 DIAGNOSIS — R1312 Dysphagia, oropharyngeal phase: Secondary | ICD-10-CM | POA: Insufficient documentation

## 2021-02-13 DIAGNOSIS — J8410 Pulmonary fibrosis, unspecified: Secondary | ICD-10-CM | POA: Insufficient documentation

## 2021-02-13 DIAGNOSIS — K219 Gastro-esophageal reflux disease without esophagitis: Secondary | ICD-10-CM | POA: Insufficient documentation

## 2021-02-13 DIAGNOSIS — F32A Depression, unspecified: Secondary | ICD-10-CM | POA: Insufficient documentation

## 2021-02-13 DIAGNOSIS — S76309A Unspecified injury of muscle, fascia and tendon of the posterior muscle group at thigh level, unspecified thigh, initial encounter: Secondary | ICD-10-CM | POA: Insufficient documentation

## 2021-02-19 ENCOUNTER — Other Ambulatory Visit: Payer: Self-pay

## 2021-02-19 ENCOUNTER — Encounter: Payer: Self-pay | Admitting: Obstetrics & Gynecology

## 2021-02-19 ENCOUNTER — Ambulatory Visit: Payer: BC Managed Care – PPO | Admitting: Obstetrics & Gynecology

## 2021-02-19 VITALS — BP 130/70 | Ht 65.0 in | Wt 251.0 lb

## 2021-02-19 DIAGNOSIS — Z90722 Acquired absence of ovaries, bilateral: Secondary | ICD-10-CM

## 2021-02-19 DIAGNOSIS — N816 Rectocele: Secondary | ICD-10-CM

## 2021-02-19 DIAGNOSIS — Z6841 Body Mass Index (BMI) 40.0 and over, adult: Secondary | ICD-10-CM

## 2021-02-19 DIAGNOSIS — Z90710 Acquired absence of both cervix and uterus: Secondary | ICD-10-CM | POA: Diagnosis not present

## 2021-02-19 DIAGNOSIS — N952 Postmenopausal atrophic vaginitis: Secondary | ICD-10-CM

## 2021-02-19 DIAGNOSIS — Z9079 Acquired absence of other genital organ(s): Secondary | ICD-10-CM

## 2021-02-19 DIAGNOSIS — Z01419 Encounter for gynecological examination (general) (routine) without abnormal findings: Secondary | ICD-10-CM | POA: Diagnosis not present

## 2021-02-19 DIAGNOSIS — Z79890 Hormone replacement therapy (postmenopausal): Secondary | ICD-10-CM

## 2021-02-19 DIAGNOSIS — R300 Dysuria: Secondary | ICD-10-CM

## 2021-02-19 LAB — URINALYSIS, COMPLETE W/RFL CULTURE
Bacteria, UA: NONE SEEN /HPF
Bilirubin Urine: NEGATIVE
Glucose, UA: NEGATIVE
Hgb urine dipstick: NEGATIVE
Hyaline Cast: NONE SEEN /LPF
Ketones, ur: NEGATIVE
Leukocyte Esterase: NEGATIVE
Nitrites, Initial: NEGATIVE
Protein, ur: NEGATIVE
RBC / HPF: NONE SEEN /HPF (ref 0–2)
Specific Gravity, Urine: 1.02 (ref 1.001–1.03)
WBC, UA: NONE SEEN /HPF (ref 0–5)
pH: 5.5 (ref 5.0–8.0)

## 2021-02-19 LAB — NO CULTURE INDICATED

## 2021-02-19 MED ORDER — ESTRADIOL 0.075 MG/24HR TD PTWK: 0.0750 mg | MEDICATED_PATCH | TRANSDERMAL | 4 refills | Status: DC

## 2021-02-19 MED ORDER — ESTRADIOL 0.1 MG/GM VA CREA: 0.2500 | TOPICAL_CREAM | VAGINAL | 4 refills | Status: DC

## 2021-02-19 NOTE — Progress Notes (Signed)
Norma Lopez 1961/11/29 [id number]   History:    60 y.o. [id number] Married.  IR:[id number] presenting for annual gyn exam   HPI:S/P Total Hysterectomy. Postmenopause on Estradiol 0.075 patch weekly. Vasomotor menopausal symptoms well controled on that dosage. No pelvic pain.  Mild dryness with IC, helped by Estradiol cream vaginally.  Pap 01/2018 Negative. C/O Rectocele with stable bulging in the vagina when passing BMs. Had 3 SVD. BMI increased to 41.77. Breasts wnl.  Screening Mammo Negative 11/2020. Health labs with Family MD. Bone Density normal 02/2018.  [place] 2018.  H/O diverticulitis.  Had a fall resulting in detachment of the left hamstrings, surgery done in 11/2020, doing PT currently.  Tender at the left vulva since then.  Past medical history,surgical history, family history and social history were all reviewed and documented in the EPIC chart.  Gynecologic History No LMP recorded. Patient has had a hysterectomy.  Obstetric History OB History  Gravida Para Term Preterm AB Living  4 3     1 3   SAB IAB Ectopic Multiple Live Births               # Outcome Date GA Lbr Len/2nd Weight Sex Delivery Anes PTL Lv  4 AB           3 Para           2 Para           1 Para              ROS: A ROS was performed and pertinent positives and negatives are included in the history.  GENERAL: No fevers or chills. HEENT: No change in vision, no earache, sore throat or sinus congestion. NECK: No pain or stiffness. CARDIOVASCULAR: No chest pain or pressure. No palpitations. PULMONARY: No shortness of breath, cough or wheeze. GASTROINTESTINAL: No abdominal pain, nausea, vomiting or diarrhea, melena or bright red blood per rectum. GENITOURINARY: No urinary frequency, urgency, hesitancy or dysuria. MUSCULOSKELETAL: No joint or muscle pain, no back pain, no recent trauma. DERMATOLOGIC: No rash, no itching, no lesions. ENDOCRINE: No polyuria, polydipsia, no heat or cold  intolerance. No recent change in weight. HEMATOLOGICAL: No anemia or easy bruising or bleeding. NEUROLOGIC: No headache, seizures, numbness, tingling or weakness. PSYCHIATRIC: No depression, no loss of interest in normal activity or change in sleep pattern.     Exam:   BP 130/70   Ht 5\' 5"  (1.651 m)   Wt 251 lb (113.9 kg)   BMI 41.77 kg/m   Body mass index is 41.77 kg/m.  General appearance : Well developed well nourished female. No acute distress HEENT: Eyes: no retinal hemorrhage or exudates,  Neck supple, trachea midline, no carotid bruits, no thyroidmegaly Lungs: Clear to auscultation, no rhonchi or wheezes, or rib retractions  Heart: Regular rate and rhythm, no murmurs or gallops Breast:Examined in sitting and supine position were symmetrical in appearance, no palpable masses or tenderness,  no skin retraction, no nipple inversion, no nipple discharge, no skin discoloration, no axillary or supraclavicular lymphadenopathy Abdomen: no palpable masses or tenderness, no rebound or guarding Extremities: no edema or skin discoloration or tenderness  Pelvic: Vulva: Normal             Vagina: No gross lesions or discharge  Cervix/Uterus absent  Adnexa  Without masses or tenderness  Anus: Normal   Assessment/Plan:  60 y.o. female for annual exam   1. Well female exam with routine gynecological exam Gynecologic exam status post  total hysterectomy with BSO.  No indication for Pap test this year.  Pap negative in February 2019.  Breast exam normal.  Screening mammogram negative in December 2021.  Colonoscopy 2018.  Health labs with family physician.  2. H/O total hysterectomy with bilateral salpingo-oophorectomy (BSO)  3. Postmenopausal hormone replacement therapy Well on estradiol patch 0.075 weekly.  No contraindication to continue on hormone replacement therapy.  Prescription sent to pharmacy.  Normal bone density in March 2019.  4. Postmenopausal atrophic vaginitis Well on  estradiol cream vaginally twice a week.  Prescription sent to pharmacy.  5. Dysuria Urine analysis completely negative.  Patient reassured. - Urinalysis,Complete w/RFL Culture  6. Baden-Walker grade 2 rectocele Minimally symptomatic high rectocele grade 2/3.  Observation.  7. Class 3 severe obesity due to excess calories with serious comorbidity and body mass index (BMI) of 40.0 to 44.9 in adult [facility]) Recommend a lower calorie/carb diet with intermittent fasting.  Increase physical activities as recovers from left Hamstrings surgery.  Other orders - REFLEXIVE URINE CULTURE - estradiol (CLIMARA - DOSED IN MG/24 HR) 0.075 mg/24hr patch; Place 1 patch (0.075 mg total) onto the skin once a week. Please specify directions, refills and quantity - estradiol (ESTRACE) 0.1 MG/GM vaginal cream; Place 0.24 Applicatorfuls vaginally See admin instructions. APPLY 1/4 APPLICATOR VAGINALLY and A THIN APPLICATION TO VULVA TWICE WEEKLY  Princess Bruins MD, 12:19 PM 02/19/2021

## 2021-02-24 ENCOUNTER — Other Ambulatory Visit: Payer: Self-pay | Admitting: Gastroenterology

## 2021-02-24 ENCOUNTER — Encounter: Payer: Self-pay | Admitting: Cardiology

## 2021-02-24 ENCOUNTER — Other Ambulatory Visit: Payer: Self-pay

## 2021-02-24 ENCOUNTER — Telehealth (INDEPENDENT_AMBULATORY_CARE_PROVIDER_SITE_OTHER): Payer: BC Managed Care – PPO | Admitting: Cardiology

## 2021-02-24 VITALS — BP 132/72 | HR 67 | Ht 65.0 in | Wt 229.0 lb

## 2021-02-24 DIAGNOSIS — R002 Palpitations: Secondary | ICD-10-CM

## 2021-02-24 DIAGNOSIS — F419 Anxiety disorder, unspecified: Secondary | ICD-10-CM

## 2021-02-24 DIAGNOSIS — I493 Ventricular premature depolarization: Secondary | ICD-10-CM

## 2021-02-24 DIAGNOSIS — I10 Essential (primary) hypertension: Secondary | ICD-10-CM | POA: Diagnosis not present

## 2021-02-24 DIAGNOSIS — R0789 Other chest pain: Secondary | ICD-10-CM

## 2021-02-24 DIAGNOSIS — F32A Depression, unspecified: Secondary | ICD-10-CM

## 2021-02-24 DIAGNOSIS — E782 Mixed hyperlipidemia: Secondary | ICD-10-CM | POA: Diagnosis not present

## 2021-02-24 NOTE — Patient Instructions (Signed)

## 2021-02-24 NOTE — Progress Notes (Signed)
Virtual Visit via Telephone Note   This visit type was conducted due to national recommendations for restrictions regarding the COVID-19 Pandemic (e.g. social distancing) in an effort to limit this patient's exposure and mitigate transmission in our community.  Due to her co-morbid illnesses, this patient is at least at moderate risk for complications without adequate follow up.  This format is felt to be most appropriate for this patient at this time.  The patient did not have access to video technology/had technical difficulties with video requiring transitioning to audio format only (telephone).  All issues noted in this document were discussed and addressed.  No physical exam could be performed with this format.  Please refer to the patient's chart for her  consent to telehealth for [facility].  Evaluation Performed:  Follow-up visit  This visit type was conducted due to national recommendations for restrictions regarding the COVID-19 Pandemic (e.g. social distancing).  This format is felt to be most appropriate for this patient at this time.  All issues noted in this document were discussed and addressed.  No physical exam was performed (except for noted visual exam findings with Video Visits).  Please refer to the patient's chart (MyChart message for video visits and phone note for telephone visits) for the patient's consent to telehealth for [facility].  Date:  02/24/2021  ID: Norma Lopez, DOB 05-20-1961, MRN [id number]   Patient Location: [place] [address]   Provider location:   [place] Office  PCP:  Verdell Carmine., MD  Cardiologist:  Jenne Campus, MD     Chief Complaint: I am doing better  History of Present Illness:    Norma Lopez is a 60 y.o. female  who presents via audio/video conferencing for a telehealth visit today.   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   Also recently she required surgical intervention of her leg because of torn muscle.  She went to surgery with no difficulties.  As a preparation of the surgery we did echocardiogram which showed preserved left ventricle ejection fraction, she also got coronary CT angio which showed normal coronaries without significant plaques, her calcium score was 0.  She was unable to establish video link today because of some outdated software of her iPhone.  Therefore, we were talking only over the phone. She says she is doing better she is in physical therapy still walking with a cane however.  She gained some weight and she is disappointed about it.  She is talking about potentially having some stomach reduction surgery to help her to lose weight.  Denies have any palpitations shortness of breath is still there and she blamed her weight.   The patient does not have symptoms concerning for COVID-19 infection (fever, chills, cough, or new SHORTNESS OF BREATH).    Prior CV studies:   The following studies were reviewed today:  Coronary CT angio reviewed which showed normal coronaries with calcium score 0.     Past Medical History:  Diagnosis Date  . ADD (attention deficit disorder with hyperactivity)   . Adnexal mass 10/15/2015  . Age-related nuclear cataract of both eyes 01/13/2016  . Allergic rhinitis   . ALLERGIC RHINITIS 06/11/2008   Qualifier: Diagnosis of  By: Annamaria Boots MD, Clinton D   . Allergy   . Anxiety and depression   . Arthropathy of cervical facet joint 01/30/2019   Formatting of this note might be different  from the original. Added automatically from request for surgery [phone]  . Asthma   . ASTHMA 04/20/2008   Qualifier: Diagnosis of  By: Joya Gaskins MD, Burnett Harry   . Bilateral carpal tunnel syndrome 01/13/2016  . Chronic dryness of both eyes 01/13/2016  . Chronic obstructive pulmonary disease ([place]) 02/03/2021  . Cognitive complaints with normal neuropsychological exam 01/13/2016  . Complete  detachment of hamstring muscle   . Complete rupture of left proximal hamstring tendon 12/02/2020   Formatting of this note might be different from the original. Added automatically from request for surgery [id number]  . Conjunctival concretions of both eyes 01/13/2016  . Conjunctival hemorrhage of right eye 06/17/2016  . COPD (chronic obstructive pulmonary disease) ([place])   . Cough 04/26/2008   Qualifier: Diagnosis of  By: Royal Piedra NP, Tammy    . Daytime somnolence 01/13/2016  . Diabetes ([place])   . Diabetes mellitus type 2, controlled ([place]) 01/13/2016  . Diarrhea, functional   . Diverticulosis   . Dysphagia 07/27/2016  . Dyspnea on exertion 01/13/2016  . Dysrhythmia 2015   none since 2015  . Edema 01/13/2016  . Elevated liver enzymes 11/02/2017  . Episodic tension-type headache, not intractable 03/10/2018  . Essential hypertension 10/15/2015  . Family history of colon cancer   . Fibrosis of lung ([place])   . Gait disorder 07/30/2016  . Gastroesophageal reflux disease 04/20/2008   Formatting of this note might be different from the original. Overview:  Qualifier: Diagnosis of  By: Burt Knack CMA, Cecille Rubin  . Generalized anxiety disorder 12/28/1988  . GERD 04/20/2008   Qualifier: Diagnosis of  By: Burt Knack CMA, Cecille Rubin    . GERD (gastroesophageal reflux disease)   . History of pneumoconiosis    with ILD with nocturnal hypoxemia on 02 2 l/min  . History of varicose veins 01/13/2016  . Hyperlipidemia   . HYPERLIPIDEMIA 04/20/2008   Qualifier: Diagnosis of  By: Joya Gaskins MD, Burnett Harry   . Hypoxemia 01/13/2016  . IBS (irritable bowel syndrome)   . Immunocompromised state ([place]) 08/18/2017  . Joint pain, knee 01/13/2016  . Left groin hernia 01/13/2016  . Leukopenia 11/15/2018  . Low back pain without sciatica 07/30/2016  . Migraines   . Mild intermittent asthma 10/15/2015  . Multiple lung nodules on CT   . Neck pain 07/30/2016  . Neuromuscular disorder ([facility])    neuropathy feet  . Nocturnal hypoxemia 04/20/2008   patient  reports using CPAP machine at night Formatting of this note might be different from the original. Overview:  Qualifier: Diagnosis of  By: Annamaria Boots MD, Kasandra Knudsen  Formatting of this note might be different from the original. Overview:  Qualifier: Diagnosis of  By: Joya Gaskins MD, Christell Faith of this note might be different from the original. 2L with CPAP  . Obesity   . Oropharyngeal dysphagia   . OTHER DISEASES OF LUNG NOT ELSEWHERE CLASSIFIED 04/20/2008   Qualifier: Diagnosis of  By: Joya Gaskins MD, Burnett Harry   . Other premature beats 02/02/2011   Qualifier: Diagnosis of  By: Via LPN, Jeani Hawking    . Other vitreous opacities, right eye 06/17/2016  . Palpitations 08/26/2018  . Panic disorder   . 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 Overview:  Qualifier: Diagnosis of  By: Joya Gaskins MD, Burnett Harry  . PONV (postoperative nausea and vomiting)   . PONV (postoperative nausea and vomiting)   .  Precordial chest pain 08/26/2018  . Premature ventricular contraction 02/02/2011   none since 2015 Formatting of this note might be different from the original. Overview:  Qualifier: Diagnosis of  By: Via LPN, Jeani Hawking  . Preop cardiovascular exam 02/20/2020  . 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 Presbyopia 06/17/2016  . PTSD (post-traumatic stress disorder)   . Pulmonary fibrosis, unspecified ([place]) 01/13/2016  . Rectal incontinence   . Restless leg syndrome   . S/P left knee arthroscopy 03/05/2020  . Sarcoidosis   . Severe episode of recurrent major depressive disorder, without psychotic features ([place]) 01/13/2016  . Situational anxiety 01/15/2016  . Sleep apnea    patient reports using CPAP machine at night  . SLEEP APNEA 11/11/2008   Qualifier: Diagnosis of  By: Annamaria Boots MD, Clinton D   . Spondylosis without myelopathy or radiculopathy, thoracic region 01/30/2019   Formatting of this note might be different from the original. Added  automatically from request for surgery [phone]  Formatting of this note might be different from the original. Added automatically from request for surgery [id number] Formatting of this note might be different from the original. Added automatically from request for surgery [phone]  . 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 Symptomatic menopausal or female climacteric states 01/13/2016  . Thoracic back sprain 11/04/2017  . Thyroid nodule   . Ventricular premature depolarization 02/02/2011   Overview:  Overview:  Qualifier: Diagnosis of  By: Via LPN, Jeani Hawking   . Vitamin D deficiency 01/13/2016    Past Surgical History:  Procedure Laterality Date  . BREAST EXCISIONAL BIOPSY Left 1985, 2006   Benign  . CHOLECYSTECTOMY    . COLONOSCOPY  06/23/2017   Colonic polyp status post polypectomy. Pancolonic diverticulosis predominantly in the sigmoid colon'  . ESOPHAGOGASTRODUODENOSCOPY  07/25/2008   Healed esophageal erosions. Irregular Z-line suggestive of gastroesophageal reflux (biopsied to rule out short-segment Barrett's esophagus) Mild gastritis.  Marland Kitchen HAMSTRING SURGERY  11/2020   ANCHOR BOLT   . KNEE ARTHROSCOPY     x3  . KNEE ARTHROSCOPY WITH MEDIAL MENISECTOMY Left 02/26/2020   Procedure: KNEE ARTHROSCOPY WITH MEDIAL MENISECTOMY;  Surgeon: Vickey Huger, MD;  Location: [facility];  Service: Orthopedics;  Laterality: Left;  . TONSILLECTOMY    . TOTAL ABDOMINAL HYSTERECTOMY  1992  . TUBAL LIGATION    . veins stripped  Left 1989     Current Meds  Medication Sig  . acetaminophen (TYLENOL) 500 MG tablet Take 500-1,000 mg by mouth every 6 (six) hours as needed (for pain.).  . Adalimumab 40 MG/0.4ML PSKT Inject 40 mg into the skin. Patient reports once weekly injections  . AMBULATORY NON FORMULARY MEDICATION Medication Name: Diltiazem 2%/ Lidocaine 2% apply a small amount inside anal opening and to the external anal area three times a day for 6 weeks  . Armodafinil 250 MG tablet Take 250 mg by mouth daily.  .  busPIRone (BUSPAR) 10 MG tablet Take 15 mg by mouth in the morning and at bedtime.  . Cholecalciferol (VITAMIN D-3) 125 MCG (5000 UT) TABS Take 5,000 Units by mouth daily.  Marland Kitchen desvenlafaxine (PRISTIQ) 100 MG 24 hr tablet Take 1 tablet (100 mg total) by mouth daily.  Marland Kitchen DEXILANT 60 MG capsule TAKE 1 CAPSULE BY MOUTH EVERY DAY  . diphenhydrAMINE (BENADRYL) 25 MG tablet Take 25 mg by mouth every 6 (six) hours as needed (allergic reactions.).  Marland Kitchen EQUETRO 100 MG CP12 12 hr capsule Take 100 mg by mouth at bedtime.  . EQUETRO 300 MG CP12 Take 1  capsule by mouth at bedtime.  Marland Kitchen estradiol (CLIMARA - DOSED IN MG/24 HR) 0.075 mg/24hr patch Place 1 patch (0.075 mg total) onto the skin once a week. Please specify directions, refills and quantity  . famotidine (PEPCID) 20 MG tablet TAKE 1 TABLET BY MOUTH EVERY DAY  . Fluticasone Furoate 100 MCG/ACT AEPB Inhale 1 puff into the lungs at bedtime. ARNUITY ELLIPTA  . L-Methylfolate 15 MG TABS Take 15 mg by mouth at bedtime.   Marland Kitchen lisinopril (ZESTRIL) 20 MG tablet Take 20 mg by mouth daily.  Marland Kitchen loratadine (CLARITIN) 10 MG tablet Take 10 mg by mouth at bedtime.  . methotrexate 50 MG/2ML injection Inject 7.5 mg into the skin every Sunday.   . metoprolol succinate (TOPROL-XL) 25 MG 24 hr tablet Take 25 mg by mouth daily.  . Omega-3 Fatty Acids (FISH OIL ULTRA) 1400 MG CAPS Take 1,400 mg by mouth daily.  . ondansetron (ZOFRAN-ODT) 4 MG disintegrating tablet Take 1 tablet by mouth every 8 (eight) hours as needed.  . OXYGEN Inhale 2 L into the lungs at bedtime.   . pramipexole (MIRAPEX) 1 MG tablet Take 1 mg by mouth at bedtime.  . rosuvastatin (CRESTOR) 10 MG tablet Take 10 mg by mouth every Monday, Wednesday, and Friday at 8 PM.       Family History: The patient's family history includes Alcohol abuse in her father; Allergies in an other family member; Anxiety disorder in her mother and another family member; Arthritis in her mother and another family member; Asthma in her  sister and sister; Breast cancer (age of onset: 68) in her sister; COPD in her father and mother; Cancer in her father; Cancer (age of onset: 85) in her sister; Depression in her mother; Diabetes in her father and sister; Heart attack in her father; Heart disease in her father; Hyperlipidemia in an other family member; Obesity in an other family member; Sleep apnea in her sister.   ROS:   Please see the history of present illness.     All other systems reviewed and are negative.   Labs/Other Tests and Data Reviewed:     Recent Labs: 04/30/2020: ALT 20 10/09/2020: Hemoglobin 14.4; Platelets 317.0 12/10/2020: BUN 15; Creatinine, Ser 0.87; Potassium 4.1; Sodium 142  Recent Lipid Panel No results found for: CHOL, TRIG, HDL, CHOLHDL, VLDL, LDLCALC, LDLDIRECT    Exam:    Vital Signs:  BP 132/72   Pulse 67   Ht 5\' 5"  (1.651 m)   Wt 229 lb (103.9 kg)   SpO2 97%   BMI 38.11 kg/m     Wt Readings from Last 3 Encounters:  02/24/21 229 lb (103.9 kg)  02/19/21 251 lb (113.9 kg)  01/10/21 248 lb (112.5 kg)     Well nourished, well developed in no acute distress. Alert oriented x3 not in distress when I was talking her on the phone.   Diagnosis for this visit:   1. Premature ventricular contraction   2. Essential hypertension   3. Palpitations   4. Mixed hyperlipidemia   5. Atypical chest pain   6. Anxiety and depression      ASSESSMENT & PLAN:    1.  PVCs.  Successfully suppressed with small dose of beta-blocker which I will continue. 2. Essential hypertension, doing well from that point we will continue present management. 3.  Palpitations.  Successfully suppressed. 4.  Dyslipidemia.  She is scheduled to see her primary care physician will do her fasting lipid profile but will wait for  results of it. 5.  Atypical chest pain I did review coronary CT angio with her.  It showed no evidence of coronary artery disease, calcium score is 0.  Of course we still have to continue risk  factors modification_I told her. 6.  Anxiety and depression lipid follow-up in 10 medicine team.  COVID-19 Education: The signs and symptoms of COVID-19 were discussed with the patient and how to seek care for testing (follow up with PCP or arrange E-visit).  The importance of social distancing was discussed today.  Patient Risk:   After full review of this patients clinical status, I feel that they are at least moderate risk at this time.  Time:   Today, I have spent 15 minutes with the patient with telehealth technology discussing pt health issues.  I spent 15 minutes reviewing her chart before the visit.  Visit was finished at 2:40 PM.    Medication Adjustments/Labs and Tests Ordered: Current medicines are reviewed at length with the patient today.  Concerns regarding medicines are outlined above.  No orders of the defined types were placed in this encounter.  Medication changes: No orders of the defined types were placed in this encounter.    Disposition: Follow-up 5 months  Signed, Park Liter, MD, [facility] 02/24/2021 2:33 PM    [place]

## 2021-04-29 ENCOUNTER — Other Ambulatory Visit: Payer: Self-pay | Admitting: Gastroenterology

## 2021-05-26 ENCOUNTER — Emergency Department (HOSPITAL_COMMUNITY)
Admission: EM | Admit: 2021-05-26 | Discharge: 2021-05-26 | Disposition: A | Discharge status: Home / Self Care | Payer: BC Managed Care – PPO | Attending: Emergency Medicine | Admitting: Emergency Medicine

## 2021-05-26 ENCOUNTER — Encounter (HOSPITAL_COMMUNITY): Payer: Self-pay | Admitting: Student

## 2021-05-26 ENCOUNTER — Other Ambulatory Visit: Payer: Self-pay

## 2021-05-26 ENCOUNTER — Emergency Department (HOSPITAL_COMMUNITY): Payer: BC Managed Care – PPO

## 2021-05-26 DIAGNOSIS — Z87891 Personal history of nicotine dependence: Secondary | ICD-10-CM | POA: Diagnosis not present

## 2021-05-26 DIAGNOSIS — Z79899 Other long term (current) drug therapy: Secondary | ICD-10-CM | POA: Diagnosis not present

## 2021-05-26 DIAGNOSIS — E119 Type 2 diabetes mellitus without complications: Secondary | ICD-10-CM | POA: Diagnosis not present

## 2021-05-26 DIAGNOSIS — U071 COVID-19: Secondary | ICD-10-CM | POA: Diagnosis not present

## 2021-05-26 DIAGNOSIS — R109 Unspecified abdominal pain: Secondary | ICD-10-CM | POA: Diagnosis present

## 2021-05-26 DIAGNOSIS — Z91040 Latex allergy status: Secondary | ICD-10-CM | POA: Insufficient documentation

## 2021-05-26 DIAGNOSIS — J449 Chronic obstructive pulmonary disease, unspecified: Secondary | ICD-10-CM | POA: Diagnosis not present

## 2021-05-26 DIAGNOSIS — R519 Headache, unspecified: Secondary | ICD-10-CM

## 2021-05-26 DIAGNOSIS — I10 Essential (primary) hypertension: Secondary | ICD-10-CM | POA: Diagnosis not present

## 2021-05-26 DIAGNOSIS — J4520 Mild intermittent asthma, uncomplicated: Secondary | ICD-10-CM | POA: Diagnosis not present

## 2021-05-26 DIAGNOSIS — R112 Nausea with vomiting, unspecified: Secondary | ICD-10-CM | POA: Insufficient documentation

## 2021-05-26 DIAGNOSIS — Z7951 Long term (current) use of inhaled steroids: Secondary | ICD-10-CM | POA: Diagnosis not present

## 2021-05-26 DIAGNOSIS — R197 Diarrhea, unspecified: Secondary | ICD-10-CM

## 2021-05-26 LAB — COMPREHENSIVE METABOLIC PANEL
ALT: 50 U/L — ABNORMAL HIGH (ref 0–44)
AST: 36 U/L (ref 15–41)
Albumin: 4 g/dL (ref 3.5–5.0)
Alkaline Phosphatase: 102 U/L (ref 38–126)
Anion gap: 7 (ref 5–15)
BUN: 15 mg/dL (ref 6–20)
CO2: 27 mmol/L (ref 22–32)
Calcium: 9.1 mg/dL (ref 8.9–10.3)
Chloride: 103 mmol/L (ref 98–111)
Creatinine, Ser: 0.89 mg/dL (ref 0.44–1.00)
GFR, Estimated: >60 mL/min (ref >60)
Glucose, Bld: 117 mg/dL — ABNORMAL HIGH (ref 70–99)
Potassium: 3.9 mmol/L (ref 3.5–5.1)
Sodium: 137 mmol/L (ref 135–145)
Total Bilirubin: 0.4 mg/dL (ref 0.3–1.2)
Total Protein: 7.2 g/dL (ref 6.5–8.1)

## 2021-05-26 LAB — CBC WITH DIFFERENTIAL/PLATELET
Abs Immature Granulocytes: 0.03 10*3/uL (ref 0.00–0.07)
Basophils Absolute: 0 10*3/uL (ref 0.0–0.1)
Basophils Relative: 1 %
Eosinophils Absolute: 0.2 10*3/uL (ref 0.0–0.5)
Eosinophils Relative: 4 %
HCT: 43.9 % (ref 36.0–46.0)
Hemoglobin: 14.5 g/dL (ref 12.0–15.0)
Immature Granulocytes: 1 %
Lymphocytes Relative: 25 %
Lymphs Abs: 1 10*3/uL (ref 0.7–4.0)
MCH: 28.3 pg (ref 26.0–34.0)
MCHC: 33 g/dL (ref 30.0–36.0)
MCV: 85.6 fL (ref 80.0–100.0)
Monocytes Absolute: 0.4 10*3/uL (ref 0.1–1.0)
Monocytes Relative: 10 %
Neutro Abs: 2.5 10*3/uL (ref 1.7–7.7)
Neutrophils Relative %: 59 %
Platelets: 251 10*3/uL (ref 150–400)
RBC: 5.13 MIL/uL — ABNORMAL HIGH (ref 3.87–5.11)
RDW: 13.6 % (ref 11.5–15.5)
WBC: 4.1 10*3/uL (ref 4.0–10.5)
nRBC: 0 % (ref 0.0–0.2)

## 2021-05-26 LAB — LIPASE, BLOOD: Lipase: 55 U/L — ABNORMAL HIGH (ref 11–51)

## 2021-05-26 MED ORDER — ONDANSETRON HCL 4 MG/2ML IJ SOLN
4.0000 mg | Freq: Once | INTRAMUSCULAR | Status: AC
  Administered 2021-05-26: 4 mg via INTRAVENOUS
  Filled 2021-05-26: qty 2

## 2021-05-26 MED ORDER — ACETAMINOPHEN 325 MG PO TABS
650.0000 mg | ORAL_TABLET | Freq: Once | ORAL | Status: AC
  Administered 2021-05-26: 650 mg via ORAL
  Filled 2021-05-26: qty 2

## 2021-05-26 MED ORDER — DIPHENHYDRAMINE HCL 25 MG PO CAPS
25.0000 mg | ORAL_CAPSULE | Freq: Once | ORAL | Status: AC
  Administered 2021-05-26: 25 mg via ORAL
  Filled 2021-05-26: qty 1

## 2021-05-26 MED ORDER — PROCHLORPERAZINE EDISYLATE 10 MG/2ML IJ SOLN
10.0000 mg | Freq: Once | INTRAMUSCULAR | Status: AC
  Administered 2021-05-26: 10 mg via INTRAVENOUS
  Filled 2021-05-26: qty 2

## 2021-05-26 MED ORDER — SODIUM CHLORIDE 0.9 % IV BOLUS
1000.0000 mL | Freq: Once | INTRAVENOUS | Status: AC
  Administered 2021-05-26: 1000 mL via INTRAVENOUS

## 2021-05-26 NOTE — ED Notes (Signed)
Patient transported to CT 

## 2021-05-26 NOTE — ED Provider Notes (Signed)
[facility] DEPT Provider Note   CSN: [id number] Arrival date & time: 05/26/21  1211     History Chief Complaint  Patient presents with  . Abdominal Pain  . Nausea  . Emesis  . Headache  . Blood In [place] is a 60 y.o. female.  HPI Patient with history of pulmonary sarcoid.  Also known COVID infection.  Has had symptoms for 10 days now.  Positive testing at home.  Has had Paxil of it which she just finished up Friday with today being Monday.  Has had some shortness of breath cough and has had a headache for the last week and a half.  Now developed more diarrhea.  States it is mucousy.  Also had blood in the diarrhea.  Now complaining worsening abdominal pain.  She is immunosuppressed due to the sarcoid treatment.  Pain is mostly in the left upper abdomen.  Worse with movement.  Has had nausea and vomiting that also gets worse with movement.  Patient has diverticulitis history.  States she has pancolonic diverticulosis.  Is worried she could have diverticulitis again.  Feels as if her left abdomen is more swollen also.    Past Medical History:  Diagnosis Date  . ADD (attention deficit disorder with hyperactivity)   . Adnexal mass 10/15/2015  . Age-related nuclear cataract of both eyes 01/13/2016  . Allergic rhinitis   . ALLERGIC RHINITIS 06/11/2008   Qualifier: Diagnosis of  By: Annamaria Boots MD, Clinton D   . Allergy   . Anxiety and depression   . Arthropathy of cervical facet joint 01/30/2019   Formatting of this note might be different from the original. Added automatically from request for surgery [phone]  . Asthma   . ASTHMA 04/20/2008   Qualifier: Diagnosis of  By: Joya Gaskins MD, Burnett Harry   . Bilateral carpal tunnel syndrome 01/13/2016  . Chronic dryness of both eyes 01/13/2016  . Chronic obstructive pulmonary disease ([place]) 02/03/2021  . Cognitive complaints with normal neuropsychological exam 01/13/2016  . Complete detachment of hamstring  muscle   . Complete rupture of left proximal hamstring tendon 12/02/2020   Formatting of this note might be different from the original. Added automatically from request for surgery [id number]  . Conjunctival concretions of both eyes 01/13/2016  . Conjunctival hemorrhage of right eye 06/17/2016  . COPD (chronic obstructive pulmonary disease) ([place])   . Cough 04/26/2008   Qualifier: Diagnosis of  By: Royal Piedra NP, Tammy    . Daytime somnolence 01/13/2016  . Diabetes ([place])   . Diabetes mellitus type 2, controlled ([place]) 01/13/2016  . Diarrhea, functional   . Diverticulosis   . Dysphagia 07/27/2016  . Dyspnea on exertion 01/13/2016  . Dysrhythmia 2015   none since 2015  . Edema 01/13/2016  . Elevated liver enzymes 11/02/2017  . Episodic tension-type headache, not intractable 03/10/2018  . Essential hypertension 10/15/2015  . Family history of colon cancer   . Fibrosis of lung ([place])   . Gait disorder 07/30/2016  . Gastroesophageal reflux disease 04/20/2008   Formatting of this note might be different from the original. Overview:  Qualifier: Diagnosis of  By: Burt Knack CMA, Cecille Rubin  . Generalized anxiety disorder 12/28/1988  . GERD 04/20/2008   Qualifier: Diagnosis of  By: Burt Knack CMA, Cecille Rubin    . GERD (gastroesophageal reflux disease)   . History of pneumoconiosis    with ILD with nocturnal hypoxemia on 02 2 l/min  . History of varicose veins 01/13/2016  .  Hyperlipidemia   . HYPERLIPIDEMIA 04/20/2008   Qualifier: Diagnosis of  By: Joya Gaskins MD, Burnett Harry   . Hypoxemia 01/13/2016  . IBS (irritable bowel syndrome)   . Immunocompromised state ([place]) 08/18/2017  . Joint pain, knee 01/13/2016  . Left groin hernia 01/13/2016  . Leukopenia 11/15/2018  . Low back pain without sciatica 07/30/2016  . Migraines   . Mild intermittent asthma 10/15/2015  . Multiple lung nodules on CT   . Neck pain 07/30/2016  . Neuromuscular disorder (H[facility])    neuropathy feet  . Nocturnal hypoxemia 04/20/2008   patient reports using CPAP machine at  night Formatting of this note might be different from the original. Overview:  Qualifier: Diagnosis of  By: Annamaria Boots MD, Kasandra Knudsen  Formatting of this note might be different from the original. Overview:  Qualifier: Diagnosis of  By: Joya Gaskins MD, Christell Faith of this note might be different from the original. 2L with CPAP  . Obesity   . Oropharyngeal dysphagia   . OTHER DISEASES OF LUNG NOT ELSEWHERE CLASSIFIED 04/20/2008   Qualifier: Diagnosis of  By: Joya Gaskins MD, Burnett Harry   . Other premature beats 02/02/2011   Qualifier: Diagnosis of  By: Via LPN, Jeani Hawking    . Other vitreous opacities, right eye 06/17/2016  . Palpitations 08/26/2018  . Panic disorder   . 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 Overview:  Qualifier: Diagnosis of  By: Joya Gaskins MD, Burnett Harry  . PONV (postoperative nausea and vomiting)   . PONV (postoperative nausea and vomiting)   . Precordial chest pain 08/26/2018  . Premature ventricular contraction 02/02/2011   none since 2015 Formatting of this note might be different from the original. Overview:  Qualifier: Diagnosis of  By: Via LPN, Jeani Hawking  . Preop cardiovascular exam 02/20/2020  . 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 Presbyopia 06/17/2016  . PTSD (post-traumatic stress disorder)   . Pulmonary fibrosis, unspecified ([place]) 01/13/2016  . Rectal incontinence   . Restless leg syndrome   . S/P left knee arthroscopy 03/05/2020  . Sarcoidosis   . Severe episode of recurrent major depressive disorder, without psychotic features ([place]) 01/13/2016  . Situational anxiety 01/15/2016  . Sleep apnea    patient reports using CPAP machine at night  . SLEEP APNEA 11/11/2008   Qualifier: Diagnosis of  By: Annamaria Boots MD, Clinton D   . Spondylosis without myelopathy or radiculopathy, thoracic region 01/30/2019   Formatting of this note might be different from the original. Added automatically from request for  surgery [phone]  Formatting of this note might be different from the original. Added automatically from request for surgery [phone] Formatting of this note might be different from the original. Added automatically from request for surgery [phone]  . 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 Symptomatic menopausal or female climacteric states 01/13/2016  . Thoracic back sprain 11/04/2017  . Thyroid nodule   . Ventricular premature depolarization 02/02/2011   Overview:  Overview:  Qualifier: Diagnosis of  By: Via LPN, Jeani Hawking   . Vitamin D deficiency 01/13/2016    Patient Active Problem List   Diagnosis Date Noted  . Sleep apnea   . Rectal incontinence   . Oropharyngeal dysphagia   . Hyperlipidemia   . GERD (gastroesophageal reflux disease)   . Fibrosis of lung ([place])   . Complete detachment of hamstring muscle   . Anxiety and depression   .  Chronic obstructive pulmonary disease ([place]) 02/03/2021  . Atypical chest pain 12/04/2020  . Restless leg syndrome   . PTSD (post-traumatic stress disorder)   . PONV (postoperative nausea and vomiting)   . Neuromuscular disorder ([place])   . IBS (irritable bowel syndrome)   . Family history of colon cancer   . Diarrhea, functional   . Diabetes ([place])   . COPD (chronic obstructive pulmonary disease) ([place])   . Allergy   . Complete rupture of left proximal hamstring tendon 12/02/2020  . S/P left knee arthroscopy 03/05/2020  . Preop cardiovascular exam 02/20/2020  . Spondylosis without myelopathy or radiculopathy, thoracic region 01/30/2019  . Leukopenia 11/15/2018  . Precordial chest pain 08/26/2018  . Palpitations 08/26/2018  . Thoracic back sprain 11/04/2017  . Immunocompromised state ([place]) 08/18/2017  . Gait disorder 07/30/2016  . Neck pain 07/30/2016  . Other vitreous opacities, right eye 06/17/2016  . Presbyopia 06/17/2016  . Situational anxiety 01/15/2016  . Diabetes mellitus type 2, controlled ([place]) 01/13/2016  . Cognitive complaints with normal  neuropsychological exam 01/13/2016  . Hypoxemia 01/13/2016  . Thyroid nodule 01/13/2016  . Vitamin D deficiency 01/13/2016  . Age-related nuclear cataract of both eyes 01/13/2016  . Chronic dryness of both eyes 01/13/2016  . Conjunctival concretions of both eyes 01/13/2016  . Edema 01/13/2016  . History of varicose veins 01/13/2016  . Dyspnea on exertion 01/13/2016  . Obesity 01/13/2016  . Multiple lung nodules on CT 01/13/2016  . Pulmonary fibrosis, unspecified ([place]) 01/13/2016  . Diverticulosis 01/13/2016  . Bilateral carpal tunnel syndrome 01/13/2016  . Daytime somnolence 01/13/2016  . Joint pain, knee 01/13/2016  . Left groin hernia 01/13/2016  . 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 Severe episode of recurrent major depressive disorder, without psychotic features ([place]) 01/13/2016  . Symptomatic menopausal or female climacteric states 01/13/2016  . Adnexal mass 10/15/2015  . Essential hypertension 10/15/2015  . Dysrhythmia 2015  . Urinary incontinence 08/25/2012  . OTHER PREMATURE BEATS 02/02/2011  . Ventricular premature depolarization 02/02/2011  . Premature ventricular contraction 02/02/2011  . SLEEP APNEA 11/11/2008  . ALLERGIC RHINITIS 06/11/2008  . COUGH 04/26/2008  . HYPERLIPIDEMIA 04/20/2008  . ASTHMA 04/20/2008  . GERD 04/20/2008  . Sarcoidosis 04/20/2008  . Nocturnal hypoxemia 04/20/2008  . Gastroesophageal reflux disease 04/20/2008    Past Surgical History:  Procedure Laterality Date  . BREAST EXCISIONAL BIOPSY Left 1985, 2006   Benign  . CHOLECYSTECTOMY    . COLONOSCOPY  06/23/2017   Colonic polyp status post polypectomy. Pancolonic diverticulosis predominantly in the sigmoid colon'  . ESOPHAGOGASTRODUODENOSCOPY  07/25/2008   Healed esophageal erosions. Irregular Z-line suggestive of gastroesophageal reflux (biopsied to rule out short-segment Barrett's esophagus) Mild gastritis.  Marland Kitchen HAMSTRING SURGERY  11/2020   ANCHOR BOLT   . KNEE ARTHROSCOPY     x3  . KNEE  ARTHROSCOPY WITH MEDIAL MENISECTOMY Left 02/26/2020   Procedure: KNEE ARTHROSCOPY WITH MEDIAL MENISECTOMY;  Surgeon: Vickey Huger, MD;  Location: [facility];  Service: Orthopedics;  Laterality: Left;  . TONSILLECTOMY    . TOTAL ABDOMINAL HYSTERECTOMY  1992  . TUBAL LIGATION    . veins stripped  Left 1989     OB History    Gravida  4   Para  3   Term      Preterm      AB  1   Living  3     SAB      IAB      Ectopic  Multiple      Live Births              Family History  Problem Relation Age of Onset  . COPD Mother   . Arthritis Mother   . Depression Mother   . Anxiety disorder Mother   . COPD Father   . Alcohol abuse Father   . Heart disease Father   . Diabetes Father   . Cancer Father        lung  . Heart attack Father   . Asthma Sister   . Cancer Sister 14       colon  . Asthma Sister   . Diabetes Sister   . Breast cancer Sister 4  . Sleep apnea Sister   . Hyperlipidemia Other   . Allergies Other   . Obesity Other   . Anxiety disorder Other   . Arthritis Other     Social History   Tobacco Use  . Smoking status: Former Smoker    Years: 6.00    Quit date: 12/28/1996    Years since quitting: 24.4  . Smokeless tobacco: Never Used  . Tobacco comment: Exposed to second hand smoke  Vaping Use  . Vaping Use: Never used  Substance Use Topics  . Alcohol use: No  . Drug use: No    Home Medications Prior to Admission medications   Medication Sig Start Date End Date Taking? Authorizing Provider  acetaminophen (TYLENOL) 500 MG tablet Take 500-1,000 mg by mouth every 6 (six) hours as needed (for pain.).    [provider]  Adalimumab 40 MG/0.4ML PSKT Inject 40 mg into the skin. Patient reports once weekly injections    [provider]  AMBULATORY NON FORMULARY MEDICATION Medication Name: Diltiazem 2%/ Lidocaine 2% apply a small amount inside anal opening and to the external anal area three times a day for 6 weeks 10/09/20    Noralyn Pick, NP  Armodafinil 250 MG tablet Take 250 mg by mouth daily. 12/13/19   [provider]  busPIRone (BUSPAR) 10 MG tablet Take 15 mg by mouth in the morning and at bedtime. 12/06/19   [provider]  Cholecalciferol (VITAMIN D-3) 125 MCG (5000 UT) TABS Take 5,000 Units by mouth daily.    [provider]  desvenlafaxine (PRISTIQ) 100 MG 24 hr tablet Take 1 tablet (100 mg total) by mouth daily. 07/11/19   Cottle, Billey Co., MD  DEXILANT 60 MG capsule TAKE 1 CAPSULE BY MOUTH EVERY DAY 02/24/21   Jackquline Denmark, MD  diphenhydrAMINE (BENADRYL) 25 MG tablet Take 25 mg by mouth every 6 (six) hours as needed (allergic reactions.).    [provider]  EQUETRO 100 MG CP12 12 hr capsule Take 100 mg by mouth at bedtime. 11/20/19   [provider]  EQUETRO 300 MG CP12 Take 1 capsule by mouth at bedtime. 12/13/19   [provider]  estradiol (CLIMARA - DOSED IN MG/24 HR) 0.075 mg/24hr patch Place 1 patch (0.075 mg total) onto the skin once a week. Please specify directions, refills and quantity 02/19/21   Princess Bruins, MD  famotidine (PEPCID) 20 MG tablet TAKE 1 TABLET BY MOUTH EVERY DAY 04/29/21   Jackquline Denmark, MD  Fluticasone Furoate 100 MCG/ACT AEPB Inhale 1 puff into the lungs at bedtime. ARNUITY ELLIPTA    [provider]  L-Methylfolate 15 MG TABS Take 15 mg by mouth at bedtime.  02/17/19   [provider]  lisinopril (ZESTRIL) 20 MG  tablet Take 20 mg by mouth daily. 11/26/20   [provider]  loratadine (CLARITIN) 10 MG tablet Take 10 mg by mouth at bedtime.    [provider]  methotrexate 50 MG/2ML injection Inject 7.5 mg into the skin every Sunday.  06/28/18   [provider]  metoprolol succinate (TOPROL-XL) 25 MG 24 hr tablet Take 25 mg by mouth daily. 05/06/20   [provider]  Omega-3 Fatty Acids (FISH OIL ULTRA) 1400 MG CAPS Take 1,400 mg by mouth daily.    [provider]  ondansetron (ZOFRAN-ODT) 4 MG disintegrating tablet Take 1 tablet by mouth every 8 (eight) hours as needed. 10/26/16   [provider]  OXYGEN Inhale 2 L into the lungs at bedtime.     [provider]  pramipexole (MIRAPEX) 1 MG tablet Take 1 mg by mouth at bedtime. 01/11/20   [provider]  rosuvastatin (CRESTOR) 10 MG tablet Take 10 mg by mouth every Monday, Wednesday, and Friday at 8 PM.  08/05/18   [provider]    Allergies    Gadolinium, Latex, Pedi-pre tape spray [wound dressing adhesive], Oxycodone hcl, Azithromycin, Dicyclomine, Shellfish allergy, and Wellbutrin [bupropion]  Review of Systems   Review of Systems  Constitutional: Positive for appetite change and fatigue. Negative for fever.  HENT: Negative for congestion.   Gastrointestinal: Positive for abdominal pain, blood in stool, diarrhea and nausea. Negative for anal bleeding.  Genitourinary: Negative for dysuria.  Musculoskeletal: Negative for back pain.  Skin: Negative for rash.  Neurological: Positive for headaches.  Psychiatric/Behavioral: Negative for confusion.    Physical Exam Updated Vital Signs BP (!) 159/109   Pulse 79   Temp 97.8 F (36.6 C) (Oral)   Resp 17   Ht 5\' 5"  (1.651 m)   Wt 110.2 kg   SpO2 97%   BMI 40.44 kg/m   Physical Exam Vitals and nursing note reviewed.  Constitutional:      Appearance: She is obese.  HENT:     Head: Normocephalic.  Cardiovascular:     Rate and Rhythm: Normal rate and regular rhythm.  Pulmonary:     Effort: Pulmonary effort is normal.     Breath sounds: No wheezing or rhonchi.  Abdominal:     Tenderness: There is abdominal tenderness.     Hernia: No hernia is present.     Comments: Tenderness to upper abdomen and left mid abdomen.  With palpation to right upper abdomen patient states the pain is more on the left abdomen.  No hernias palpated.  Skin:    General: Skin is warm.     Capillary Refill:  Capillary refill takes less than 2 seconds.  Neurological:     Mental Status: She is alert.     ED Results / Procedures / Treatments   Labs (all labs ordered are listed, but only abnormal results are displayed) Labs Reviewed  COMPREHENSIVE METABOLIC PANEL - Abnormal; Notable for the following components:      Result Value   Glucose, Bld 117 (*)    ALT 50 (*)    All other components within normal limits  LIPASE, BLOOD - Abnormal; Notable for the following components:   Lipase 55 (*)    All other components within normal limits  CBC WITH DIFFERENTIAL/PLATELET - Abnormal; Notable for the following components:   RBC 5.13 (*)    All other components within normal limits    EKG None  Radiology CT ABDOMEN PELVIS WO CONTRAST  Result Date: 05/26/2021 CLINICAL DATA:  Diagnosed with COVID-19 infection 9 days ago. Abdominal pain beginning 3 days ago. Blood in the stool 3 days ago. Clinical concern for diverticulitis. EXAM: CT ABDOMEN AND PELVIS WITHOUT CONTRAST TECHNIQUE: Multidetector CT imaging of the abdomen and pelvis was performed following the standard protocol without IV contrast. COMPARISON:  05/01/2020. FINDINGS: Lower chest: No acute abnormality. Hepatobiliary: No focal liver abnormality is seen. Status post cholecystectomy. No biliary dilatation. Pancreas: Unremarkable. No pancreatic ductal dilatation or surrounding inflammatory changes. Spleen: Normal in size without focal abnormality. Adrenals/Urinary Tract: Adrenal glands are unremarkable. Kidneys are normal, without renal calculi, focal lesion, or hydronephrosis. Bladder is unremarkable. Stomach/Bowel: Scattered colonic diverticulum, most evident on the left. No diverticulitis. Normal: Caliber. No wall thickening or inflammation. Normal stomach and small bowel.  Normal appendix visualized. Vascular/Lymphatic: No significant vascular findings are present. No enlarged abdominal or pelvic lymph nodes. Reproductive: Status post  hysterectomy. No adnexal masses. Other: Fat containing right paraumbilical hernia.  No ascites. Musculoskeletal: No fracture or acute finding.  No bone lesion. IMPRESSION: 1. No acute findings within the abdomen or pelvis. 2. Colonic diverticula, but no evidence of diverticulitis. Electronically Signed   By: Lajean Manes M.D.   On: 05/26/2021 13:41    Procedures Procedures   Medications Ordered in ED Medications  diphenhydrAMINE (BENADRYL) capsule 25 mg (has no administration in time range)  sodium chloride 0.9 % bolus 1,000 mL (0 mLs Intravenous Stopped 05/26/21 1428)  ondansetron (ZOFRAN) injection 4 mg (4 mg Intravenous Given 05/26/21 1308)  prochlorperazine (COMPAZINE) injection 10 mg (10 mg Intravenous Given 05/26/21 1513)  acetaminophen (TYLENOL) tablet 650 mg (650 mg Oral Given 05/26/21 1513)    ED Course  I have reviewed the triage vital signs and the nursing notes.  Pertinent labs & imaging results that were available during my care of the patient were reviewed by me and considered in my medical decision making (see chart for details).    MDM Rules/Calculators/A&P                         Patient presents with known COVID infection.  Had been on Paxil bid.  Now developed some GI symptoms.  Diarrhea with some blood.  Mucousy stool.  Also abdominal pain and worried she had a recurrent diverticulitis.  CT scan done reassuring.  Also had a headache.  Was treated like migraine.  History of migraines.  Feels much better after besides some akathisia.  Discharge home and follow-up as an outpatient. Final Clinical Impression(s) / ED Diagnoses Final diagnoses:  COVID-19  Acute nonintractable headache, unspecified headache type  Diarrhea, unspecified type    Rx / DC Orders ED Discharge Orders    None       Davonna Belling, MD 05/26/21 [phone]

## 2021-05-26 NOTE — ED Triage Notes (Signed)
Patient BIB POV. Patient dx with COVID 9 days ago.  Patient started with abdominal pain on Friday, patient saw blood in her stool in Friday night and thought it was from her hemorrhoids.  Noticed her belly was bloated on Saturday.  This morning she woke up just generally not feeling well, concerned she may have diverticulitis again. Patient having N/V/D, chills, body aches, and loss of appetite.

## 2021-07-07 ENCOUNTER — Other Ambulatory Visit: Payer: Self-pay | Admitting: Gastroenterology

## 2021-07-07 NOTE — Telephone Encounter (Signed)
Pharmacy said that dexilant isnt convered by insurance and is sending a script for Protonix 40mg  to take. Are you ok with this change?

## 2021-09-11 ENCOUNTER — Other Ambulatory Visit: Payer: Self-pay | Admitting: Gastroenterology

## 2021-09-11 ENCOUNTER — Telehealth: Payer: Self-pay

## 2021-09-11 NOTE — Telephone Encounter (Signed)
Called the patient and LVM that her medication was denied, and call us if she has any questions or concerns.

## 2021-09-12 NOTE — Telephone Encounter (Signed)
Dexilant isnt convered by insurance and Pharmacy is sending a script for Protonix '40mg'$  to take. Are you ok with this change?

## 2021-09-15 NOTE — Telephone Encounter (Signed)
Protonix 40mg  Once daily has sent to the pharmacy.

## 2021-09-15 NOTE — Telephone Encounter (Signed)
Dexilant isnt covered by [employer] and Pharmacy is sending a script for Protonix 40mg  for an alternative to take. Since you are the doctor of [place], Are you ok with this change?

## 2021-09-15 NOTE — Telephone Encounter (Signed)
Dexilant isnt convered by [employer] and Pharmacy is sending a script for Protonix 40mg  for an alternative to take. Since you are the doctor of Norma Lopez, Are you ok with this change?

## 2021-09-19 ENCOUNTER — Encounter: Payer: Self-pay | Admitting: Obstetrics & Gynecology

## 2021-09-19 ENCOUNTER — Ambulatory Visit: Payer: BC Managed Care – PPO | Admitting: Obstetrics & Gynecology

## 2021-09-19 ENCOUNTER — Other Ambulatory Visit: Payer: Self-pay

## 2021-09-19 VITALS — BP 110/76 | Temp 99.4°F

## 2021-09-19 DIAGNOSIS — B373 Candidiasis of vulva and vagina: Secondary | ICD-10-CM | POA: Diagnosis not present

## 2021-09-19 DIAGNOSIS — B3731 Acute candidiasis of vulva and vagina: Secondary | ICD-10-CM

## 2021-09-19 DIAGNOSIS — R32 Unspecified urinary incontinence: Secondary | ICD-10-CM

## 2021-09-19 DIAGNOSIS — N94819 Vulvodynia, unspecified: Secondary | ICD-10-CM | POA: Diagnosis not present

## 2021-09-19 DIAGNOSIS — R159 Full incontinence of feces: Secondary | ICD-10-CM

## 2021-09-19 LAB — URINALYSIS, COMPLETE W/RFL CULTURE
Bacteria, UA: NONE SEEN /HPF
Bilirubin Urine: NEGATIVE
Casts: NONE SEEN /LPF
Crystals: NONE SEEN /HPF
Glucose, UA: NEGATIVE
Hyaline Cast: NONE SEEN /LPF
Ketones, ur: NEGATIVE
Leukocyte Esterase: NEGATIVE
Nitrites, Initial: NEGATIVE
Protein, ur: NEGATIVE
RBC / HPF: NONE SEEN /HPF (ref 0–2)
Specific Gravity, Urine: 1.025 (ref 1.001–1.035)
WBC, UA: NONE SEEN /HPF (ref 0–5)
Yeast: NONE SEEN /HPF
pH: 6 (ref 5.0–8.0)

## 2021-09-19 LAB — NO CULTURE INDICATED

## 2021-09-19 MED ORDER — FLUCONAZOLE 150 MG PO TABS: 150.0000 mg | ORAL_TABLET | Freq: Every day | ORAL | 3 refills | Status: AC

## 2021-09-19 NOTE — Progress Notes (Signed)
    Norma Lopez August 08, 1961 [id number]        60 y.o.  G4P0013 Married  RP: Left vulvar pain and tingling/Urinary and stool incontinence  HPI:  Left vulvar pain and tingling/Urinary and stool incontinence since a bad fall which resulted in left sciatic nerve pains radiating to the left leg this summer. CT scan of abdomen and Pelvis with no acute findings on 05/26/2021.   OB History  Gravida Para Term Preterm AB Living  4 3     1 3   SAB IAB Ectopic Multiple Live Births               # Outcome Date GA Lbr Len/2nd Weight Sex Delivery Anes PTL Lv  4 AB           3 Para           2 Para           1 Para             Past medical history,surgical history, problem list, medications, allergies, family history and social history were all reviewed and documented in the EPIC chart.   Directed ROS with pertinent positives and negatives documented in the history of present illness/assessment and plan.  Exam:  Vitals:   09/19/21 1140  BP: 110/76  Temp: 99.4 F (37.4 C)  TempSrc: Oral   General appearance:  Normal  Gynecologic exam: Vulva normal in appearance.  Left vulvar tenderness at the labia majora.  Anal sphincter tone present but low.  High rectocele stable.  Vaginal discharge c/w yeast.  U/A Negative   Assessment/Plan:  60 y.o. 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  Decision to refer to Neurosurgeon.  2. Anal sphincter incontinence Able to contract her anal sphincter, but the tone is weak.  Refer to PT for Pelvic floor/Anal Sphincter reinforcement  3. Urinary incontinence, unspecified type U/A Negative, patient reassured.  PT for pelvic floor reinforcement.  Avoid bladder irritants such as caffeine, tea, soda, chocolate. - Urinalysis,Complete w/RFL Culture  4. Yeast vaginitis Will treat with fluconazole.  Usage reviewed and prescription sent to pharmacy.  Other orders - albuterol (VENTOLIN HFA)  108 (90 Base) MCG/ACT inhaler; Inhale 2 puffs into the lungs every 6 (six) hours as needed. - TRULICITY 6.96 EX/5.2WU SOPN; SMARTSIG:0.5 Milliliter(s) SUB-Q Once a Week - ONETOUCH ULTRA test strip; TEST BLOOD SUGARS DAILY - predniSONE (DELTASONE) 10 MG tablet; PLEASE SEE ATTACHED FOR DETAILED DIRECTIONS - traMADol (ULTRAM) 50 MG tablet; Take 50 mg by mouth every 6 (six) hours as needed. - methotrexate 250 MG/10ML injection; SMARTSIG:0.3 Milliliter(s) SUB-Q Once a Week - fluconazole (DIFLUCAN) 150 MG tablet; Take 1 tablet (150 mg total) by mouth daily for 3 days.   Princess Bruins MD, 12:06 PM 09/19/2021

## 2021-09-22 ENCOUNTER — Telehealth: Payer: Self-pay | Admitting: *Deleted

## 2021-09-22 DIAGNOSIS — R159 Full incontinence of feces: Secondary | ICD-10-CM

## 2021-09-22 DIAGNOSIS — R32 Unspecified urinary incontinence: Secondary | ICD-10-CM

## 2021-09-22 NOTE — Telephone Encounter (Signed)
-----   Message from Princess Bruins, MD sent at 09/19/2021 12:15 PM EDT ----- Regarding: Refer to Physical Therapy and Neurosurgery Urinary/stool incontinence with weak anal sphincter tone.  PT to reinforce the pelvic floor/anal sphincter.  Realignment of the pelvis?  Left vulvar pain/tingling, Pudendal nerve compression? Left sciatic nerve pain with bulging discs in lumbar area?  Refer to Neurosurgeon.

## 2021-09-22 NOTE — Telephone Encounter (Signed)
Referral for Physical therapy placed at [facility] Physical therapy center at [facility], they will call to schedule. Office notes will be faxed to [place] once [facility] finishes the office note.

## 2021-09-24 ENCOUNTER — Encounter: Payer: Self-pay | Admitting: Obstetrics & Gynecology

## 2021-09-25 NOTE — Telephone Encounter (Signed)
Office notes faxed to [facility], they will call to schedule.

## 2021-10-03 NOTE — Telephone Encounter (Signed)
Patient scheduled on 10/07/21 @ 10:00 am with Dr.Stern

## 2021-10-27 ENCOUNTER — Other Ambulatory Visit: Payer: Self-pay

## 2021-10-28 ENCOUNTER — Other Ambulatory Visit: Payer: Self-pay

## 2021-10-28 ENCOUNTER — Encounter: Payer: Self-pay | Admitting: Cardiology

## 2021-10-28 ENCOUNTER — Ambulatory Visit (INDEPENDENT_AMBULATORY_CARE_PROVIDER_SITE_OTHER): Payer: BC Managed Care – PPO | Admitting: Cardiology

## 2021-10-28 VITALS — BP 126/80 | HR 85 | Ht 65.0 in | Wt 242.2 lb

## 2021-10-28 DIAGNOSIS — D869 Sarcoidosis, unspecified: Secondary | ICD-10-CM | POA: Diagnosis not present

## 2021-10-28 DIAGNOSIS — G4734 Idiopathic sleep related nonobstructive alveolar hypoventilation: Secondary | ICD-10-CM | POA: Diagnosis not present

## 2021-10-28 DIAGNOSIS — I10 Essential (primary) hypertension: Secondary | ICD-10-CM | POA: Diagnosis not present

## 2021-10-28 DIAGNOSIS — R918 Other nonspecific abnormal finding of lung field: Secondary | ICD-10-CM

## 2021-10-28 DIAGNOSIS — I493 Ventricular premature depolarization: Secondary | ICD-10-CM

## 2021-10-28 NOTE — Progress Notes (Signed)
Cardiology Office Note:    Date:  10/28/2021   ID:  Norma Lopez, DOB February 24, 1961, MRN [id number]  PCP:  Verdell Carmine., MD  Cardiologist:  Jenne Campus, MD    Referring MD: Verdell Carmine., MD   Chief Complaint  Patient presents with   Shortness of Breath   Palpitations    History of Present Illness:    Norma Lopez is a 60 y.o. female with complex past medical history that include sarcoidosis with pulmonary involvement, palpitations some form of PVCs, essential hypertension, diabetes.  She comes today 2 months of follow-up.  She did have her surgery on her lower extremities however now she does have stool incontinence which of course is very troubling to her.  She also gained significant amount of weight to reason for that is the fact that she could not get her medication for sarcoidosis and she ended up being put on steroids which make her gain weight and make her feel very bad.  Described to have some palpitations any effort will bring her heart rate up she also have desaturation during the night which make her heart rate up.  Palpitations we had a  Past Medical History:  Diagnosis Date   ADD (attention deficit disorder with hyperactivity)    Adnexal mass 10/15/2015   Age-related nuclear cataract of both eyes 01/13/2016   Allergic rhinitis    ALLERGIC RHINITIS 06/11/2008   Qualifier: Diagnosis of  By: Annamaria Boots MD, Clinton D    Allergy    Anxiety and depression    Arthropathy of cervical facet joint 01/30/2019   Formatting of this note might be different from the original. Added automatically from request for surgery [id number]   Asthma    ASTHMA 04/20/2008   Qualifier: Diagnosis of  By: Joya Gaskins MD, Burnett Harry    Atypical chest pain 12/04/2020   Bilateral carpal tunnel syndrome 01/13/2016   Chronic dryness of both eyes 01/13/2016   Chronic obstructive pulmonary disease ([place]) 02/03/2021   Cognitive complaints with normal neuropsychological exam 01/13/2016   Complete detachment  of hamstring muscle    Complete rupture of left proximal hamstring tendon 12/02/2020   Formatting of this note might be different from the original. Added automatically from request for surgery [id number]   Conjunctival concretions of both eyes 01/13/2016   Conjunctival hemorrhage of right eye 06/17/2016   COPD (chronic obstructive pulmonary disease) ([place])    Cough 04/26/2008   Qualifier: Diagnosis of  By: Royal Piedra NP, Tammy     Daytime somnolence 01/13/2016   Diabetes ([place])    Diabetes mellitus type 2, controlled ([place]) 01/13/2016   Diarrhea, functional    Diverticulosis    Dysphagia 07/27/2016   Dyspnea on exertion 01/13/2016   Dysrhythmia 2015   none since 2015   Edema 01/13/2016   Elevated liver enzymes 11/02/2017   Episodic tension-type headache, not intractable 03/10/2018   Essential hypertension 10/15/2015   Family history of colon cancer    Fibrosis of lung ([place])    Gait disorder 07/30/2016   Gastroesophageal reflux disease 04/20/2008   Formatting of this note might be different from the original. Overview:  Qualifier: Diagnosis of  By: Burt Knack CMA, Lori   Generalized anxiety disorder 12/28/1988   GERD 04/20/2008   Qualifier: Diagnosis of  By: Burt Knack CMA, Cecille Rubin     GERD (gastroesophageal reflux disease)    History of pneumoconiosis    with ILD with nocturnal hypoxemia on 02 2 l/min   History of varicose veins 01/13/2016  Hyperlipidemia    HYPERLIPIDEMIA 04/20/2008   Qualifier: Diagnosis of  By: Joya Gaskins MD, Burnett Harry    Hypoxemia 01/13/2016   IBS (irritable bowel syndrome)    Immunocompromised state ([place]) 08/18/2017   Joint pain, knee 01/13/2016   Left groin hernia 01/13/2016   Leukopenia 11/15/2018   Low back pain without sciatica 07/30/2016   Lumbar herniated disc    Migraines    Mild intermittent asthma 10/15/2015   Multiple lung nodules on CT    Neck pain 07/30/2016   Neuromuscular disorder (HCC)    neuropathy feet   Nocturnal hypoxemia 04/20/2008   patient  reports using CPAP machine at night Formatting of this note might be different from the original. Overview:  Qualifier: Diagnosis of  By: Annamaria Boots MD, Kasandra Knudsen  Formatting of this note might be different from the original. Overview:  Qualifier: Diagnosis of  By: Joya Gaskins MD, Christell Faith of this note might be different from the original. 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 Overview:  Qualifier: Diagnosis of  By: Joya Gaskins MD, Burnett Harry   PONV (postoperative nausea and vomiting)    PONV (postoperative nausea and vomiting)    Precordial chest pain 08/26/2018   Premature ventricular contraction 02/02/2011   none since 2015 Formatting of this note might be different from the original. 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   Presbyopia 06/17/2016   PTSD (post-traumatic stress disorder)    Pulmonary fibrosis, unspecified ([place]) 01/13/2016   Rectal incontinence    Restless leg syndrome    S/P left knee arthroscopy 03/05/2020   Sarcoidosis    Severe episode of recurrent major depressive disorder, without psychotic features ([place]) 01/13/2016   Situational anxiety 01/15/2016   Sleep apnea    patient reports using CPAP machine at night   SLEEP APNEA 11/11/2008   Qualifier: Diagnosis of  By: Annamaria Boots MD, Clinton D    Spondylosis without myelopathy or radiculopathy, thoracic region 01/30/2019   Formatting of this note might be different from the original. Added automatically  from request for surgery [phone]  Formatting of this note might be different from the original. Added automatically from request for surgery [phone] Formatting of this note might be different from the original. Added automatically from request for surgery [id number]   Steroid-induced diabetes ([place]) 07/01/2016   Symptomatic menopausal or female climacteric states 01/13/2016   Thoracic back sprain 11/04/2017   Thyroid nodule    Ventricular premature depolarization 02/02/2011   Overview:  Overview:  Qualifier: Diagnosis of  By: Via LPN, Victoria Vera Bing D deficiency 01/13/2016    Past Surgical History:  Procedure Laterality Date   BREAST EXCISIONAL BIOPSY Left 1985, 2006   Benign   CHOLECYSTECTOMY     COLONOSCOPY  06/23/2017   Colonic polyp status post polypectomy. Pancolonic diverticulosis predominantly in the sigmoid colon'   ESOPHAGOGASTRODUODENOSCOPY  07/25/2008   Healed esophageal  erosions. Irregular Z-line suggestive of gastroesophageal reflux (biopsied to rule out short-segment Barrett's esophagus) Mild gastritis.   HAMSTRING SURGERY  11/2020   ANCHOR BOLT    KNEE ARTHROSCOPY     x3   KNEE ARTHROSCOPY WITH MEDIAL MENISECTOMY Left 02/26/2020   Procedure: KNEE ARTHROSCOPY WITH MEDIAL MENISECTOMY;  Surgeon: Vickey Huger, MD;  Location: [facility];  Service: Orthopedics;  Laterality: Left;   TONSILLECTOMY     TOTAL ABDOMINAL HYSTERECTOMY  1992   TUBAL LIGATION     veins stripped  Left 1989    Current Medications: Current Meds  Medication Sig   acetaminophen (TYLENOL) 500 MG tablet Take 500-1,000 mg by mouth every 6 (six) hours as needed (for pain.).   Adalimumab 40 MG/0.4ML PSKT Inject 40 mg into the skin once a week. Patient reports once weekly injections   albuterol (VENTOLIN HFA) 108 (90 Base) MCG/ACT inhaler Inhale 2 puffs into the lungs every 6 (six) hours as needed for wheezing or shortness of breath.   Armodafinil 250 MG tablet Take 250 mg by mouth daily.   Cholecalciferol (VITAMIN  D-3) 125 MCG (5000 UT) TABS Take 5,000 Units by mouth daily.   desvenlafaxine (PRISTIQ) 100 MG 24 hr tablet Take 1 tablet (100 mg total) by mouth daily.   diphenhydrAMINE (BENADRYL) 25 MG tablet Take 25 mg by mouth every 6 (six) hours as needed for itching or allergies (allergic reactions.).   EQUETRO 100 MG CP12 12 hr capsule Take 100 mg by mouth at bedtime.   EQUETRO 300 MG CP12 Take 1 capsule by mouth at bedtime.   estradiol (CLIMARA - DOSED IN MG/24 HR) 0.075 mg/24hr patch Place 1 patch (0.075 mg total) onto the skin once a week. Please specify directions, refills and quantity   fluconazole (DIFLUCAN) 150 MG tablet Take 150 mg by mouth as needed for other. Infection   Fluticasone Furoate 100 MCG/ACT AEPB Inhale 1 puff into the lungs at bedtime. ARNUITY ELLIPTA   L-Methylfolate 15 MG TABS Take 15 mg by mouth at bedtime.    lisinopril (ZESTRIL) 20 MG tablet Take 20 mg by mouth daily.   loratadine (CLARITIN) 10 MG tablet Take 10 mg by mouth at bedtime.   methotrexate 250 MG/10ML injection Inject 30 mg/m2 into the muscle once a week. Friday's   metoprolol succinate (TOPROL-XL) 25 MG 24 hr tablet Take 25 mg by mouth daily.   Omega-3 Fatty Acids (FISH OIL ULTRA) 1400 MG CAPS Take 1,400 mg by mouth daily.   ondansetron (ZOFRAN-ODT) 4 MG disintegrating tablet Take 1 tablet by mouth every 8 (eight) hours as needed for nausea or vomiting.   ONETOUCH ULTRA test strip 1 each by Other route as needed for other (Glucose).   OXYGEN Inhale 2 L into the lungs at bedtime.    OZEMPIC, 0.25 OR 0.5 MG/DOSE, 2 MG/1.5ML SOPN Inject 0.5 mg into the skin once a week.   pantoprazole (PROTONIX) 40 MG tablet Take 1 tablet (40 mg total) by mouth daily.   pramipexole (MIRAPEX) 1 MG tablet Take 1 mg by mouth at bedtime.   rosuvastatin (CRESTOR) 10 MG tablet Take 10 mg by mouth every other day.     Allergies:   Gadolinium, Latex, Pedi-pre tape spray [wound dressing adhesive], Azithromycin, Shellfish allergy, and  Wellbutrin [bupropion]   Social History   Socioeconomic History   Marital status: Married    Spouse name: Not on file   Number of children: Not on file   Years of education: Not on file  Highest education level: Not on file  Occupational History    Employer: [employer]   Occupation: RN  Tobacco Use   Smoking status: Former    Years: 6.00    Types: Cigarettes    Quit date: 12/28/1996    Years since quitting: 24.8   Smokeless tobacco: Never   Tobacco comments:    Exposed to second hand smoke  Vaping Use   Vaping Use: Never used  Substance and Sexual Activity   Alcohol use: No   Drug use: No   Sexual activity: Yes    Partners: Male    Birth control/protection: Surgical    Comment: 1st intercourse- 14, partners- 68, married- 69 yrs , hysterectomy  Other Topics Concern   Not on file  Social History Narrative   Married with 3 children   3-4 cups caffeine per day   Exercises 2-3 times per week   Social Determinants of Health   Financial Resource Strain: Not on file  Food Insecurity: Not on file  Transportation Needs: Not on file  Physical Activity: Not on file  Stress: Not on file  Social Connections: Not on file     Family History: The patient's family history includes Alcohol abuse in her father; Allergies in an other family member; Anxiety disorder in her mother and another family member; Arthritis in her mother and another family member; Asthma in her sister and sister; Breast cancer (age of onset: 78) in her sister; COPD in her father and mother; Cancer in her father; Cancer (age of onset: 71) in her sister; Depression in her mother; Diabetes in her father and sister; Heart attack in her father; Heart disease in her father; Hyperlipidemia in an other family member; Obesity in an other family member; Sleep apnea in her sister. ROS:   Please see the history of present illness.    All 14 point review of systems negative except as described per history of present  illness  EKGs/Labs/Other Studies Reviewed:      Recent Labs: 05/26/2021: ALT 50; BUN 15; Creatinine, Ser 0.89; Hemoglobin 14.5; Platelets 251; Potassium 3.9; Sodium 137  Recent Lipid Panel No results found for: CHOL, TRIG, HDL, CHOLHDL, VLDL, LDLCALC, LDLDIRECT  Physical Exam:    VS:  BP 126/80 (BP Location: Left Arm, Patient Position: Sitting)   Pulse 85   Ht 5\' 5"  (1.651 m)   Wt 242 lb 3.2 oz (109.9 kg)   SpO2 93%   BMI 40.30 kg/m     Wt Readings from Last 3 Encounters:  10/28/21 242 lb 3.2 oz (109.9 kg)  05/26/21 243 lb (110.2 kg)  02/24/21 229 lb (103.9 kg)     GEN:  Well nourished, well developed in no acute distress HEENT: Normal NECK: No JVD; No carotid bruits LYMPHATICS: No lymphadenopathy CARDIAC: RRR, no murmurs, no rubs, no gallops RESPIRATORY:  Clear to auscultation without rales, wheezing or rhonchi  ABDOMEN: Soft, non-tender, non-distended MUSCULOSKELETAL:  No edema; No deformity  SKIN: Warm and dry LOWER EXTREMITIES: no swelling NEUROLOGIC:  Alert and oriented x 3 PSYCHIATRIC:  Normal affect   ASSESSMENT:    1. Premature ventricular contraction   2. Essential hypertension   3. Nocturnal hypoxemia   4. Sarcoidosis   5. Multiple lung nodules on CT    PLAN:    In order of problems listed above:  Palpitations with in form of PVCs.  We had a long discussion with the with the situation.  She is back on Humira which make her feel much  better.  We will get a wait some time and see if palpitation improved if not we will put another monitor on.  She did have quite extensive cardiac evaluation done before her surgery.  She did have coronary CT angio which likely was negative, she also had a echocardiogram showed preserved left ventricle ejection fraction. Essential hypertension blood pressure seems well controlled continue present management.  Dyslipidemia, I do not have her latest fasting lipid profile we will call primary care physician to get  it.   Medication Adjustments/Labs and Tests Ordered: Current medicines are reviewed at length with the patient today.  Concerns regarding medicines are outlined above.  No orders of the defined types were placed in this encounter.  Medication changes: No orders of the defined types were placed in this encounter.   Signed, Park Liter, MD, [facility] 10/28/2021 11:49 AM    [place]

## 2021-10-28 NOTE — Telephone Encounter (Signed)
1.Physical therapy called and spoke with patient and reports patient told her "The patient is being referred to see a specialist doctor at [facility]. The patient asked to be called back in a month."         Encounter will be closed patient is aware of referral.

## 2021-10-28 NOTE — Patient Instructions (Signed)

## 2021-11-22 ENCOUNTER — Other Ambulatory Visit: Payer: Self-pay | Admitting: Gastroenterology

## 2022-01-30 DIAGNOSIS — M5432 Sciatica, left side: Secondary | ICD-10-CM | POA: Insufficient documentation

## 2022-02-03 ENCOUNTER — Other Ambulatory Visit: Payer: Self-pay

## 2022-02-04 ENCOUNTER — Encounter: Payer: Self-pay | Admitting: Cardiology

## 2022-02-04 ENCOUNTER — Ambulatory Visit: Payer: BC Managed Care – PPO | Admitting: Cardiology

## 2022-02-11 ENCOUNTER — Other Ambulatory Visit: Payer: Self-pay | Admitting: Obstetrics & Gynecology

## 2022-02-11 ENCOUNTER — Other Ambulatory Visit: Payer: Self-pay | Admitting: Gastroenterology

## 2022-02-12 NOTE — Telephone Encounter (Signed)
AEX scheduled 02/20/22. Last AEX 01/30/21.

## 2022-02-18 ENCOUNTER — Ambulatory Visit
Admission: RE | Admit: 2022-02-18 | Discharge: 2022-02-18 | Disposition: A | Discharge status: Home / Self Care | Payer: BC Managed Care – PPO | Source: Ambulatory Visit | Attending: Obstetrics & Gynecology | Admitting: Obstetrics & Gynecology

## 2022-02-18 ENCOUNTER — Other Ambulatory Visit: Payer: Self-pay | Admitting: Obstetrics & Gynecology

## 2022-02-18 DIAGNOSIS — Z1231 Encounter for screening mammogram for malignant neoplasm of breast: Secondary | ICD-10-CM

## 2022-02-20 ENCOUNTER — Other Ambulatory Visit: Payer: Self-pay

## 2022-02-20 ENCOUNTER — Ambulatory Visit (INDEPENDENT_AMBULATORY_CARE_PROVIDER_SITE_OTHER): Payer: BC Managed Care – PPO | Admitting: Obstetrics & Gynecology

## 2022-02-20 ENCOUNTER — Encounter: Payer: Self-pay | Admitting: Obstetrics & Gynecology

## 2022-02-20 VITALS — BP 118/70 | HR 72 | Resp 16 | Ht 64.75 in | Wt 236.0 lb

## 2022-02-20 DIAGNOSIS — Z01419 Encounter for gynecological examination (general) (routine) without abnormal findings: Secondary | ICD-10-CM | POA: Diagnosis not present

## 2022-02-20 DIAGNOSIS — Z9079 Acquired absence of other genital organ(s): Secondary | ICD-10-CM

## 2022-02-20 DIAGNOSIS — Z79890 Hormone replacement therapy (postmenopausal): Secondary | ICD-10-CM

## 2022-02-20 DIAGNOSIS — Z90722 Acquired absence of ovaries, bilateral: Secondary | ICD-10-CM

## 2022-02-20 DIAGNOSIS — Z90710 Acquired absence of both cervix and uterus: Secondary | ICD-10-CM | POA: Diagnosis not present

## 2022-02-20 DIAGNOSIS — Z6839 Body mass index (BMI) 39.0-39.9, adult: Secondary | ICD-10-CM

## 2022-02-20 MED ORDER — ESTRADIOL 0.075 MG/24HR TD PTWK: 0.0750 mg | MEDICATED_PATCH | TRANSDERMAL | 4 refills | Status: DC

## 2022-02-20 NOTE — Progress Notes (Signed)
Norma Lopez February 28, 1961 [id number]   History:    61 y.o. [id number] Married.     RP:  Established patient presenting for annual gyn exam    HPI: S/P Total Hysterectomy.  Postmenopause on Estradiol 0.075 patch weekly.  Vasomotor menopausal symptoms well controled on that dosage.  No pelvic pain.  Mild dryness with IC, recommend coconut oil as needed. Pap 01/2018 Negative.  BMI decreased to 39.58. Breasts wnl.  Screening Mammo Negative 01/2022.  Health labs with Family MD. Bone Density normal 02/2018. Colono 2018.  H/O diverticulitis.  Had a fall resulting in detachment of the left hamstrings, surgery done in 11/2020, doing PT currently.  Tender at the left vulva since then, seen by Neuro, planning a nerve block.  Past medical history,surgical history, family history and social history were all reviewed and documented in the EPIC chart.  Gynecologic History No LMP recorded. Patient has had a hysterectomy.  Obstetric History OB History  Gravida Para Term Preterm AB Living  4 3     1 3   SAB IAB Ectopic Multiple Live Births               # Outcome Date GA Lbr Len/2nd Weight Sex Delivery Anes PTL Lv  4 AB           3 Para           2 Para           1 Para              ROS: A ROS was performed and pertinent positives and negatives are included in the history.  GENERAL: No fevers or chills. HEENT: No change in vision, no earache, sore throat or sinus congestion. NECK: No pain or stiffness. CARDIOVASCULAR: No chest pain or pressure. No palpitations. PULMONARY: No shortness of breath, cough or wheeze. GASTROINTESTINAL: No abdominal pain, nausea, vomiting or diarrhea, melena or bright red blood per rectum. GENITOURINARY: No urinary frequency, urgency, hesitancy or dysuria. MUSCULOSKELETAL: No joint or muscle pain, no back pain, no recent trauma. DERMATOLOGIC: No rash, no itching, no lesions. ENDOCRINE: No polyuria, polydipsia, no heat or cold intolerance. No recent change in weight. HEMATOLOGICAL:  No anemia or easy bruising or bleeding. NEUROLOGIC: No headache, seizures, numbness, tingling or weakness. PSYCHIATRIC: No depression, no loss of interest in normal activity or change in sleep pattern.     Exam:   BP 118/70    Pulse 72    Resp 16    Ht 5' 4.75" (1.645 m)    Wt 236 lb (107 kg)    BMI 39.58 kg/m   Body mass index is 39.58 kg/m.  General appearance : Well developed well nourished female. No acute distress HEENT: Eyes: no retinal hemorrhage or exudates,  Neck supple, trachea midline, no carotid bruits, no thyroidmegaly Lungs: Clear to auscultation, no rhonchi or wheezes, or rib retractions  Heart: Regular rate and rhythm, no murmurs or gallops Breast:Examined in sitting and supine position were symmetrical in appearance, no palpable masses or tenderness,  no skin retraction, no nipple inversion, no nipple discharge, no skin discoloration, no axillary or supraclavicular lymphadenopathy Abdomen: no palpable masses or tenderness, no rebound or guarding Extremities: no edema or skin discoloration or tenderness  Pelvic: Vulva: Normal             Vagina: No gross lesions or discharge  Cervix/Uterus absent  Adnexa  Without masses or tenderness  Anus: Normal   Assessment/Plan:  61 y.o. female  for annual exam   1. Well female exam with routine gynecological exam S/P Total Hysterectomy.  Postmenopause on Estradiol 0.075 patch weekly.  Vasomotor menopausal symptoms well controled on that dosage.  No pelvic pain.  Mild dryness with IC, recommend coconut oil as needed. Pap 01/2018 Negative.  BMI decreased to 39.58. Breasts wnl.  Screening Mammo Negative 01/2022.  Health labs with Family MD. Bone Density normal 02/2018. Colono 2018.  H/O diverticulitis.  Had a fall resulting in detachment of the left hamstrings, surgery done in 11/2020, doing PT currently.  Tender at the left vulva since then, seen by Neuro, planning a nerve block.  2. H/O total hysterectomy with bilateral  salpingo-oophorectomy (BSO)  3. Postmenopausal hormone replacement therapy S/P Total Hysterectomy.  Postmenopause on Estradiol 0.075 patch weekly.  Vasomotor menopausal symptoms well controled on that dosage.  No pelvic pain.  Mild dryness with IC, recommend coconut oil as needed.  Bone Density normal in 02/2018.  4. Class 2 severe obesity due to excess calories with serious comorbidity and body mass index (BMI) of 39.0 to 39.9 in adult [facility]) Lower calorie/carb diet.  Increase fitness activities.  Other orders - estradiol (CLIMARA - DOSED IN MG/24 HR) 0.075 mg/24hr patch; Place 1 patch (0.075 mg total) onto the skin once a week. Please specify directions, refills and quantity   Princess Bruins MD, 10:54 AM 02/20/2022

## 2022-04-17 ENCOUNTER — Other Ambulatory Visit: Payer: Self-pay | Admitting: Gastroenterology

## 2022-05-22 ENCOUNTER — Ambulatory Visit (INDEPENDENT_AMBULATORY_CARE_PROVIDER_SITE_OTHER): Payer: BC Managed Care – PPO | Admitting: Gastroenterology

## 2022-05-22 ENCOUNTER — Telehealth: Payer: Self-pay | Admitting: *Deleted

## 2022-05-22 ENCOUNTER — Encounter: Payer: Self-pay | Admitting: Gastroenterology

## 2022-05-22 VITALS — BP 132/82 | HR 79 | Ht 64.75 in | Wt 237.0 lb

## 2022-05-22 DIAGNOSIS — K219 Gastro-esophageal reflux disease without esophagitis: Secondary | ICD-10-CM | POA: Diagnosis not present

## 2022-05-22 DIAGNOSIS — Z800 Family history of malignant neoplasm of digestive organs: Secondary | ICD-10-CM

## 2022-05-22 DIAGNOSIS — K581 Irritable bowel syndrome with constipation: Secondary | ICD-10-CM

## 2022-05-22 DIAGNOSIS — R1310 Dysphagia, unspecified: Secondary | ICD-10-CM

## 2022-05-22 DIAGNOSIS — Z86010 Personal history of colonic polyps: Secondary | ICD-10-CM | POA: Diagnosis not present

## 2022-05-22 NOTE — Progress Notes (Signed)
Chief Complaint: FU  Referring Provider:  Verdell Carmine., MD      ASSESSMENT AND PLAN;   #1. GERD with eso dysphagia  #2. H/O colonic polyps 05/2017. FH colon Ca (sis at age 61)  #3. IBS-C with occ fecal incontinence   Plan:  -EGD with dil/colon -Continue protonix '40mg'$  po QD -Run it by Jenny Reichmann Nulty(Takes O2 at night d/t OSA) for [facility] procedures. -Kegel exercises.    I discussed EGD/Colonoscopy- the indications, risks, alternatives and potential complications including, but not limited to bleeding, infection, reaction to meds, damage to internal organs, cardiac and/or pulmonary problems, and perforation requiring surgery. The possibility that significant findings could be missed was explained. All ? were answered. Pt consents to proceed.  HPI:    Norma Lopez is a 61 y.o. female  With multiple medical problems including H/O anxiety/depression, OSA (on CPAP and O2 at night), sarcoidosis Dx 2009 off MTX, previously on Remicade in 2018 - 02/2019 and started on Humira  02/2020, GERD, diverticulitis x 2 in 2014 and 2015, C. difficile colitis 06/2020 and colon polyps. S/P total hysterectomy. S/P left knee medial and lateral meniscus tears 02/26/2020, recent L hamstring repair 11/2020.  For follow-up visit.  Intermittent dysphagia-mostly to solids.  Heartburn is better on Protonix.  He is being considered for gastric sleeve at [facility] by Dr. Florene Glen.  No nausea/vomiting/regurgitation/odynophagia.  With longstanding history of intermittent constipation.  Better now.  Does pass pellet-like stools.  No melena or hematochezia.  Abdominal bloating which gets better with BMs.  Occ fecal incontinence.   Had left hamstring tear after fall in October.  Underwent surgical repair Dec 2021.   Has not been on any antibiotics recently.  No fever chills or night sweats.   Past GI procedures: Colonoscopy 05/2017: colonic polyp s/p polypectomy (TA), pancolonic diverticulosis   EGD 06/2008:  Healed distal esophageal erosions, small HH, irregular Z-line with neg bx for Barrett's, mild gastritis. S/P dil 48Fr. Neg SB Bx for celiac, neg CLO, neg eso Bx  Past Medical History:  Diagnosis Date   ADD (attention deficit disorder with hyperactivity)    Adnexal mass 10/15/2015   Age-related nuclear cataract of both eyes 01/13/2016   Allergic rhinitis    ALLERGIC RHINITIS 06/11/2008   Qualifier: Diagnosis of  By: Annamaria Boots MD, Clinton D    Allergy    Anxiety and depression    Arthropathy of cervical facet joint 01/30/2019   Formatting of this note might be different from the original. Added automatically from request for surgery [id number]   Asthma    ASTHMA 04/20/2008   Qualifier: Diagnosis of  By: Joya Gaskins MD, Burnett Harry    Atypical chest pain 12/04/2020   Bilateral carpal tunnel syndrome 01/13/2016   Chronic dryness of both eyes 01/13/2016   Chronic obstructive pulmonary disease ([place]) 02/03/2021   Cognitive complaints with normal neuropsychological exam 01/13/2016   Complete detachment of hamstring muscle    Complete rupture of left proximal hamstring tendon 12/02/2020   Formatting of this note might be different from the original. Added automatically from request for surgery [id number]   Conjunctival concretions of both eyes 01/13/2016   Conjunctival hemorrhage of right eye 06/17/2016   COPD (chronic obstructive pulmonary disease) ([place])    Cough 04/26/2008   Qualifier: Diagnosis of  By: Royal Piedra NP, Tammy     Daytime somnolence 01/13/2016   Diabetes ([place])    Diabetes mellitus type 2, controlled ([place]) 01/13/2016   Diarrhea, functional  Diverticulosis    Dysphagia 07/27/2016   Dyspnea on exertion 01/13/2016   Dysrhythmia 2015   none since 2015   Edema 01/13/2016   Elevated liver enzymes 11/02/2017   Episodic tension-type headache, not intractable 03/10/2018   Essential hypertension 10/15/2015   Family history of colon cancer    Fibrosis of lung ([place])    Gait disorder 07/30/2016    Gastroesophageal reflux disease 04/20/2008   Formatting of this note might be different from the original. Overview:  Qualifier: Diagnosis of  By: Burt Knack CMA, Lori   Generalized anxiety disorder 12/28/1988   GERD 04/20/2008   Qualifier: Diagnosis of  By: Burt Knack CMA, Cecille Rubin     GERD (gastroesophageal reflux disease)    History of pneumoconiosis    with ILD with nocturnal hypoxemia on 02 2 l/min   History of varicose veins 01/13/2016   Hyperlipidemia    HYPERLIPIDEMIA 04/20/2008   Qualifier: Diagnosis of  By: Joya Gaskins MD, Burnett Harry    Hypoxemia 01/13/2016   IBS (irritable bowel syndrome)    Immunocompromised state ([place]) 08/18/2017   Joint pain, knee 01/13/2016   Left groin hernia 01/13/2016   Leukopenia 11/15/2018   Low back pain without sciatica 07/30/2016   Lumbar herniated disc    Migraines    Mild intermittent asthma 10/15/2015   Multiple lung nodules on CT    Neck pain 07/30/2016   Neuromuscular disorder (H[facility])    neuropathy feet   Neuropathy    left eye   Nocturnal hypoxemia 04/20/2008   patient reports using CPAP machine at night Formatting of this note might be different from the original. Overview:  Qualifier: Diagnosis of  By: Annamaria Boots MD, Clinton D  Formatting of this note might be different from the original. Overview:  Qualifier: Diagnosis of  By: Joya Gaskins MD, Christell Faith of this note might be different from the original. 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 Overview:  Qualifier: Diagnosis of  By: Joya Gaskins MD, Burnett Harry    PONV (postoperative nausea and vomiting)    PONV (postoperative nausea and vomiting)    Precordial chest pain 08/26/2018   Premature ventricular contraction 02/02/2011   none since 2015 Formatting of this note might be different from the original. 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   Presbyopia 06/17/2016   PTSD (post-traumatic stress disorder)    Pulmonary fibrosis, unspecified ([place]) 01/13/2016   Rectal incontinence    Restless leg syndrome    S/P left knee arthroscopy 03/05/2020   Sarcoidosis    Severe episode of recurrent major depressive disorder, without psychotic features ([place]) 01/13/2016   Situational anxiety 01/15/2016   Sleep apnea    patient reports using CPAP machine at night   SLEEP APNEA 11/11/2008   Qualifier: Diagnosis of  By: Annamaria Boots MD, Clinton D    Spondylosis without myelopathy or radiculopathy, thoracic region 01/30/2019   Formatting of this note  might be different from the original. Added automatically from request for surgery [phone]  Formatting of this note might be different from the original. Added automatically from request for surgery [id number] Formatting of this note might be different from the original. 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 Pancolonic diverticulosis predominantly in the sigmoid colon'   ESOPHAGOGASTRODUODENOSCOPY  07/25/2008   Healed esophageal  erosions. Irregular Z-line suggestive of gastroesophageal reflux (biopsied to rule out short-segment Barrett's esophagus) Mild gastritis.   HAMSTRING SURGERY  11/2020   ANCHOR BOLT    KNEE ARTHROSCOPY     x3   KNEE ARTHROSCOPY WITH MEDIAL MENISECTOMY Left 02/26/2020   Procedure: KNEE ARTHROSCOPY WITH MEDIAL MENISECTOMY;  Surgeon: Vickey Huger, MD;  Location: [facility];  Service: Orthopedics;  Laterality: Left;   TONSILLECTOMY     TOTAL ABDOMINAL HYSTERECTOMY  1992   TUBAL LIGATION     veins stripped  Left 1989    Family History  Problem Relation Age of Onset   COPD Mother    Arthritis Mother    Depression Mother    Anxiety disorder Mother    COPD Father    Alcohol abuse Father    Heart disease Father    Diabetes Father    Cancer Father        lung   Heart attack Father    Asthma Sister    Cancer Sister 46       colon   Asthma Sister    Diabetes Sister    Breast cancer Sister 32   Sleep apnea Sister    Hyperlipidemia Other    Allergies Other    Obesity Other    Anxiety disorder Other    Arthritis Other     Social History   Tobacco Use   Smoking status: Former    Years: 6.00    Types: Cigarettes    Quit date: 12/28/1996    Years since quitting: 25.4   Smokeless tobacco: Never   Tobacco comments:    Exposed to second hand smoke  Vaping Use   Vaping Use: Never used  Substance Use Topics   Alcohol use: No   Drug use: No    Current Outpatient Medications  Medication Sig Dispense Refill   acetaminophen (TYLENOL) 500 MG tablet Take 500-1,000 mg by mouth every 6 (six) hours as needed (for pain.).     Adalimumab 40 MG/0.4ML PSKT Inject 40 mg into the skin once a week. Patient reports once weekly injections     albuterol (VENTOLIN HFA) 108 (90 Base) MCG/ACT inhaler Inhale 2 puffs into the lungs every 6 (six) hours as needed for wheezing or shortness of breath.     busPIRone (BUSPAR) 10 MG tablet Take 10 mg by mouth 2 (two) times daily.     Cholecalciferol (VITAMIN D-3)  125 MCG (5000 UT) TABS Take 5,000 Units by mouth daily.     Coenzyme Q10 100 MG capsule Take 10 mg by mouth.  desvenlafaxine (PRISTIQ) 100 MG 24 hr tablet Take 1 tablet (100 mg total) by mouth daily. 30 tablet 0   diphenhydrAMINE (BENADRYL) 25 MG tablet Take 25 mg by mouth every 6 (six) hours as needed for itching or allergies (allergic reactions.).     estradiol (CLIMARA - DOSED IN MG/24 HR) 0.075 mg/24hr patch Place 1 patch (0.075 mg total) onto the skin once a week. Please specify directions, refills and quantity 12 patch 4   famotidine (PEPCID) 10 MG tablet daily as needed.     Fluticasone Furoate 100 MCG/ACT AEPB Inhale 1 puff into the lungs at bedtime. ARNUITY ELLIPTA     L-Methylfolate 15 MG TABS Take 15 mg by mouth at bedtime.      lisinopril (ZESTRIL) 10 MG tablet      metoprolol succinate (TOPROL-XL) 25 MG 24 hr tablet Take 25 mg by mouth daily.     mycophenolate (CELLCEPT) 500 MG tablet Take 500 mg by mouth 2 (two) times daily. Takes 3 tablets ( 1500 MG ) daily     Omega-3 Fatty Acids (FISH OIL ULTRA) 1400 MG CAPS Take 1,400 mg by mouth daily.     ONETOUCH ULTRA test strip 1 each by Other route as needed for other (Glucose).     OXYGEN Inhale 2 L into the lungs at bedtime.      OZEMPIC, 0.25 OR 0.5 MG/DOSE, 2 MG/1.5ML SOPN Inject 0.5 mg into the skin once a week.     pantoprazole (PROTONIX) 40 MG tablet Take 1 tablet (40 mg total) by mouth daily. Please call [phone] to schedule an office visit for more refills 30 tablet 2   rosuvastatin (CRESTOR) 10 MG tablet Take 10 mg by mouth every other day.  1   No current facility-administered medications for this visit.    Allergies  Allergen Reactions   Gadolinium Itching, Rash, Shortness Of Breath, Swelling and Tinitus   Latex Itching, Rash and Other (See Comments)    Blisters     Pedi-Pre Tape Spray [Wound Dressing Adhesive] Rash    redness   Pregabalin     Other reaction(s): Other (See Comments) "anxious"   Azithromycin  Palpitations   Budesonide-Formoterol Fumarate Palpitations    Tachycardia Other reaction(s): Other (See Comments)   Shellfish Allergy Rash    SHRIMP   Wellbutrin [Bupropion] Palpitations    Tachycardia     Review of Systems:  neg     Physical Exam:    BP 132/82   Pulse 79   Ht 5' 4.75" (1.645 m)   Wt 237 lb (107.5 kg)   SpO2 95%   BMI 39.74 kg/m  Wt Readings from Last 3 Encounters:  05/22/22 237 lb (107.5 kg)  02/20/22 236 lb (107 kg)  10/28/21 242 lb 3.2 oz (109.9 kg)   Constitutional:  Well-developed, in no acute distress. Psychiatric: Normal mood and affect. Behavior is normal. HEENT: Pupils normal.  Conjunctivae are normal. No scleral icterus. Cardiovascular: Normal rate, regular rhythm. No edema Pulmonary/chest: Effort normal and breath sounds normal. No wheezing, rales or rhonchi. Abdominal: Soft, nondistended. Nontender. Bowel sounds active throughout. There are no masses palpable. No hepatomegaly.  Incisional umbilical hernia Neurological: Alert and oriented to person place and time. Skin: Skin is warm and dry. No rashes noted.  Data Reviewed: I have personally reviewed following labs and imaging studies  CBC:    Latest Ref Rng & Units 05/26/2021    1:00 PM 10/09/2020   12:02 PM 05/08/2020   11:23 AM  CBC  WBC 4.0 - 10.5 K/uL 4.1   6.3   3.7    Hemoglobin 12.0 - 15.0 g/dL 14.5   14.4   14.7    Hematocrit 36.0 - 46.0 % 43.9   42.6   44.2    Platelets 150 - 400 K/uL 251   317.0   290.0      CMP:    Latest Ref Rng & Units 05/26/2021    1:00 PM 12/10/2020    2:33 PM 04/30/2020    3:37 PM  CMP  Glucose 70 - 99 mg/dL 117   84   93    BUN 6 - 20 mg/dL '15   15   16    '$ Creatinine 0.44 - 1.00 mg/dL 0.89   0.87   0.88    Sodium 135 - 145 mmol/L 137   142   138    Potassium 3.5 - 5.1 mmol/L 3.9   4.1   3.9    Chloride 98 - 111 mmol/L 103   104   105    CO2 22 - 32 mmol/L '27   26   27    '$ Calcium 8.9 - 10.3 mg/dL 9.1   8.8   8.7    Total Protein 6.5 - 8.1  g/dL 7.2    6.6    Total Bilirubin 0.3 - 1.2 mg/dL 0.4    0.3    Alkaline Phos 38 - 126 U/L 102    60    AST 15 - 41 U/L 36    19    ALT 0 - 44 U/L 50    20         Radiology Studies: No results found.     Carmell Austria, MD 05/22/2022, 2:50 PM  Cc: Verdell Carmine., MD

## 2022-05-22 NOTE — Patient Instructions (Signed)
If you are age 60 or older, your body mass index should be between 23-30. Your Body mass index is 39.74 kg/m. If this is out of the aforementioned range listed, please consider follow up with your Primary Care Provider.  If you are age 38 or younger, your body mass index should be between 19-25. Your Body mass index is 39.74 kg/m. If this is out of the aformentioned range listed, please consider follow up with your Primary Care Provider.   ________________________________________________________  The [facility] providers would like to encourage you to use [facility] to communicate with providers for non-urgent requests or questions.  Due to long hold times on the telephone, sending your provider a message by [facility] may be a faster and more efficient way to get a response.  Please allow 48 business hours for a response.  Please remember that this is for non-urgent requests.  _______________________________________________________  Please call back in 2 weeks regarding your EGD/Colon. You have been given blank instructions as well  Continue Protonix  Kegel Exercises  Kegel exercises can help strengthen your pelvic floor muscles. The pelvic floor is a group of muscles that support your rectum, small intestine, and bladder. In females, pelvic floor muscles also help support the uterus. These muscles help you control the flow of urine and stool (feces). Kegel exercises are painless and simple. They do not require any equipment. Your provider may suggest Kegel exercises to: Improve bladder and bowel control. Improve sexual response. Improve weak pelvic floor muscles after surgery to remove the uterus (hysterectomy) or after pregnancy, in females. Improve weak pelvic floor muscles after prostate gland removal or surgery, in males. Kegel exercises involve squeezing your pelvic floor muscles. These are the same muscles you squeeze when you try to stop the flow of urine or keep from passing gas. The  exercises can be done while sitting, standing, or lying down, but it is best to vary your position. Ask your health care provider which exercises are safe for you. Do exercises exactly as told by your health care provider and adjust them as directed. Do not begin these exercises until told by your health care provider. Exercises How to do Kegel exercises: Squeeze your pelvic floor muscles tight. You should feel a tight lift in your rectal area. If you are a female, you should also feel a tightness in your vaginal area. Keep your stomach, buttocks, and legs relaxed. Hold the muscles tight for up to 10 seconds. Breathe normally. Relax your muscles for up to 10 seconds. Repeat as told by your health care provider. Repeat this exercise daily as told by your health care provider. Continue to do this exercise for at least 4-6 weeks, or for as long as told by your health care provider. You may be referred to a physical therapist who can help you learn more about how to do Kegel exercises. Depending on your condition, your health care provider may recommend: Varying how long you squeeze your muscles. Doing several sets of exercises every day. Doing exercises for several weeks. Making Kegel exercises a part of your regular exercise routine. This information is not intended to replace advice given to you by your health care provider. Make sure you discuss any questions you have with your health care provider. Document Revised: 04/24/2021 Document Reviewed: 04/24/2021 Elsevier Patient Education  [place].

## 2022-05-24 ENCOUNTER — Other Ambulatory Visit: Payer: Self-pay | Admitting: Gastroenterology

## 2022-05-29 NOTE — Addendum Note (Signed)
Addended by: Curlene Labrum E on: 05/29/2022 10:04 AM   Modules accepted: Orders

## 2022-06-03 ENCOUNTER — Telehealth: Payer: Self-pay

## 2022-06-03 NOTE — Telephone Encounter (Signed)
LVM  Called pt to go over her instructions for her procedure at [facility]

## 2022-06-03 NOTE — Telephone Encounter (Signed)
Patient said she understood her instructions and if she had any questions she will give [place] a call.

## 2022-06-15 ENCOUNTER — Encounter: Payer: Self-pay | Admitting: Gastroenterology

## 2022-06-19 NOTE — Telephone Encounter (Signed)
Actually,  lets get EGD done  later,  once you are done with antibiotics. So, as to check if HP has been eradicated. RG

## 2022-07-05 DIAGNOSIS — Z8659 Personal history of other mental and behavioral disorders: Secondary | ICD-10-CM | POA: Insufficient documentation

## 2022-07-05 DIAGNOSIS — F3342 Major depressive disorder, recurrent, in full remission: Secondary | ICD-10-CM | POA: Insufficient documentation

## 2022-07-05 HISTORY — DX: Major depressive disorder, recurrent, in full remission: F33.42

## 2022-07-05 HISTORY — DX: Personal history of other mental and behavioral disorders: Z86.59

## 2022-07-30 ENCOUNTER — Encounter (HOSPITAL_COMMUNITY): Payer: Self-pay | Admitting: Gastroenterology

## 2022-08-04 ENCOUNTER — Encounter: Payer: Self-pay | Admitting: Cardiology

## 2022-08-04 ENCOUNTER — Ambulatory Visit: Payer: BC Managed Care – PPO | Admitting: Cardiology

## 2022-08-04 VITALS — BP 130/72 | HR 69 | Ht 65.0 in | Wt 233.0 lb

## 2022-08-04 DIAGNOSIS — I10 Essential (primary) hypertension: Secondary | ICD-10-CM | POA: Diagnosis not present

## 2022-08-04 DIAGNOSIS — E119 Type 2 diabetes mellitus without complications: Secondary | ICD-10-CM

## 2022-08-04 DIAGNOSIS — R002 Palpitations: Secondary | ICD-10-CM

## 2022-08-04 NOTE — Progress Notes (Signed)
Cardiology Office Note:    Date:  08/04/2022   ID:  Norma Lopez, DOB [birth year], MRN [id number]  PCP:  Verdell Carmine., MD  Cardiologist:  Jenne Campus, MD    Referring MD: Verdell Carmine., MD   Chief Complaint  Patient presents with   Medication Management    History of Present Illness:    Norma Lopez is a 61 y.o. female with past medical history significant for sarcoidosis with pulmonary involvement, palpitations and PVCs now suppressed complete with small dose of beta-blocker, essential hypertension, diabetes.  She comes today 2 months for follow-up.  Overall she is doing very well.  She denies have any chest pain tightness squeezing pressure mid chest.  She does have some psychiatric problem and she was on multiple psychiatric medication but stopped this medication by herself and feels dramatically better.  She does have psychotherapy.  And that helps her a lot.  She is talking about potentially doing gastric bypass surgery to help with weight loss.  Cardiac wise doing well denies have any symptoms that would suggest reactivation of cardiac problems.  Past Medical History:  Diagnosis Date   ADD (attention deficit disorder with hyperactivity)    Adnexal mass 10/15/2015   Age-related nuclear cataract of both eyes 01/13/2016   Allergic rhinitis    ALLERGIC RHINITIS 06/11/2008   Qualifier: Diagnosis of  By: Annamaria Boots MD, Clinton D    Allergy    Anxiety and depression    Arthropathy of cervical facet joint 01/30/2019   Formatting of this note might be different from the original. Added automatically from request for surgery [id number]   Asthma    ASTHMA 04/20/2008   Qualifier: Diagnosis of  By: Joya Gaskins MD, Burnett Harry    Atypical chest pain 12/04/2020   Bilateral carpal tunnel syndrome 01/13/2016   Chronic dryness of both eyes 01/13/2016   Chronic obstructive pulmonary disease ([place]) 02/03/2021   Cognitive complaints with normal neuropsychological exam 01/13/2016   Complete  detachment of hamstring muscle    Complete rupture of left proximal hamstring tendon 12/02/2020   Formatting of this note might be different from the original. Added automatically from request for surgery [id number]   Conjunctival concretions of both eyes 01/13/2016   Conjunctival hemorrhage of right eye 06/17/2016   COPD (chronic obstructive pulmonary disease) ([place])    Cough 04/26/2008   Qualifier: Diagnosis of  By: Royal Piedra NP, Tammy     Daytime somnolence 01/13/2016   Diabetes ([place])    Diabetes mellitus type 2, controlled ([place]) 01/13/2016   Diarrhea, functional    Diverticulosis    Dysphagia 07/27/2016   Dyspnea on exertion 01/13/2016   Dysrhythmia 2015   none since 2015   Edema 01/13/2016   Elevated liver enzymes 11/02/2017   Episodic tension-type headache, not intractable 03/10/2018   Essential hypertension 10/15/2015   Family history of colon cancer    Fibrosis of lung ([place])    Gait disorder 07/30/2016   Gastroesophageal reflux disease 04/20/2008   Formatting of this note might be different from the original. Overview:  Qualifier: Diagnosis of  By: Burt Knack CMA, Lori   Generalized anxiety disorder 12/28/1988   GERD 04/20/2008   Qualifier: Diagnosis of  By: Burt Knack CMA, Cecille Rubin     GERD (gastroesophageal reflux disease)    History of pneumoconiosis    with ILD with nocturnal hypoxemia on 02 2 l/min   History of varicose veins 01/13/2016   Hyperlipidemia    HYPERLIPIDEMIA 04/20/2008   Qualifier: Diagnosis of  By: Joya Gaskins MD, Burnett Harry    Hypoxemia 01/13/2016   IBS (irritable bowel syndrome)    Immunocompromised state ([place]) 08/18/2017   Joint pain, knee 01/13/2016   Left groin hernia 01/13/2016   Leukopenia 11/15/2018   Low back pain without sciatica 07/30/2016   Lumbar herniated disc    Migraines    Mild intermittent asthma 10/15/2015   Multiple lung nodules on CT    Neck pain 07/30/2016   Neuromuscular disorder (HCC)    neuropathy feet   Neuropathy    left eye    Nocturnal hypoxemia 04/20/2008   patient reports using CPAP machine at night Formatting of this note might be different from the original. Overview:  Qualifier: Diagnosis of  By: Annamaria Boots MD, Kasandra Knudsen  Formatting of this note might be different from the original. Overview:  Qualifier: Diagnosis of  By: Joya Gaskins MD, Christell Faith of this note might be different from the original. 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 Overview:  Qualifier: Diagnosis of  By: Joya Gaskins MD, Burnett Harry   PONV (postoperative nausea and vomiting)    PONV (postoperative nausea and vomiting)    Precordial chest pain 08/26/2018   Premature ventricular contraction 02/02/2011   none since 2015 Formatting of this note might be different from the original. 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   Presbyopia 06/17/2016   PTSD (post-traumatic stress disorder)    Pulmonary fibrosis, unspecified ([place]) 01/13/2016   Rectal incontinence    Restless leg syndrome    S/P left knee arthroscopy 03/05/2020   Sarcoidosis    Severe episode of recurrent major depressive disorder, without psychotic features ([place]) 01/13/2016   Situational anxiety 01/15/2016   Sleep apnea    patient reports using CPAP machine at night   SLEEP APNEA 11/11/2008   Qualifier: Diagnosis of  By: Annamaria Boots MD, Clinton D    Spondylosis without myelopathy or radiculopathy, thoracic region 01/30/2019   Formatting of this note might be  different from the original. Added automatically from request for surgery [id number]  Formatting of this note might be different from the original. Added automatically from request for surgery [id number] Formatting of this note might be different from the original. 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 Pancolonic diverticulosis predominantly in the sigmoid colon'   ESOPHAGOGASTRODUODENOSCOPY  07/25/2008   Healed esophageal erosions. Irregular Z-line suggestive  of gastroesophageal reflux (biopsied to rule out short-segment Barrett's esophagus) Mild gastritis.   HAMSTRING SURGERY  11/2020   ANCHOR BOLT    KNEE ARTHROSCOPY     x3   KNEE ARTHROSCOPY WITH MEDIAL MENISECTOMY Left 02/26/2020   Procedure: KNEE ARTHROSCOPY WITH MEDIAL MENISECTOMY;  Surgeon: Vickey Huger, MD;  Location: [facility];  Service: Orthopedics;  Laterality: Left;   TONSILLECTOMY     TOTAL ABDOMINAL HYSTERECTOMY  1992   TUBAL LIGATION     veins stripped  Left 1989    Current Medications: Current Meds  Medication Sig   acetaminophen (TYLENOL) 500 MG tablet Take 500-1,000 mg by mouth every 6 (six) hours as needed for moderate pain or mild pain.   Adalimumab 40 MG/0.4ML PSKT Inject 40 mg into the skin once a week. Patient reports once weekly injections   albuterol (VENTOLIN HFA) 108 (90 Base) MCG/ACT inhaler Inhale 2 puffs into the lungs every 6 (six) hours as needed for wheezing or shortness of breath.   Cholecalciferol  (VITAMIN D-3 PO) Take 2,000 Units by mouth daily.   Coenzyme Q10 100 MG capsule Take 100 mg by mouth daily.   desvenlafaxine (PRISTIQ) 100 MG 24 hr tablet Take 1 tablet (100 mg total) by mouth daily.   diphenhydrAMINE (BENADRYL) 25 MG tablet Take 25 mg by mouth every 6 (six) hours as needed for itching or allergies (allergic reactions.).   estradiol (CLIMARA - DOSED IN MG/24 HR) 0.075 mg/24hr patch Place 1 patch (0.075 mg total) onto the skin once a week. Please specify directions, refills and quantity (Patient taking differently: Place 0.075 mg onto the skin every Saturday. Please specify directions, refills and quantity)   famotidine (PEPCID) 20 MG tablet Take 20 mg by mouth in the morning.   Fluticasone Furoate 100 MCG/ACT AEPB Inhale 1 puff into the lungs at bedtime. ARNUITY ELLIPTA   L-Methylfolate 15 MG TABS Take 15 mg by mouth at bedtime. Deplin   lisinopril (ZESTRIL) 20 MG tablet Take 10 mg by mouth at bedtime.   Magnesium Oxide 250 MG TABS Take 250 mg by mouth at bedtime.   metoprolol succinate (TOPROL-XL) 25 MG 24 hr tablet Take 12.5 mg by mouth every morning.   Omega-3 Fatty Acids (FISH OIL ULTRA) 1400 MG CAPS Take 2,000 mg by mouth 2 (two) times daily.   ONETOUCH ULTRA test strip 1 each by Other route as needed for other (Glucose).   OXYGEN Inhale 1 L into the lungs at bedtime.   OZEMPIC, 0.25 OR 0.5 MG/DOSE, 2 MG/1.5ML SOPN Inject 1 mg into the skin once a week. Friday's   pantoprazole (PROTONIX) 40 MG tablet Take 1 tablet (40 mg total) by mouth daily. (Patient taking differently: Take 40 mg by mouth at bedtime.)   pramipexole (MIRAPEX) 1.5 MG tablet Take 0.75 mg by mouth at bedtime.   promethazine (PHENERGAN) 25 MG tablet Take 25 mg by mouth every 6 (six) hours as needed for nausea.   rosuvastatin (CRESTOR) 10 MG tablet Take 10 mg by mouth at bedtime.     Allergies:   Latex, Pedi-pre tape spray [wound dressing adhesive], Pregabalin, Azithromycin, Budesonide-formoterol fumarate,  Gadolinium, Shellfish allergy, and Wellbutrin [bupropion]   Social History   Socioeconomic History   Marital status: Married    Spouse name: Not on file   Number of children: Not on file   Years of education: Not on file   Highest education level: Not on file  Occupational History    Employer: [employer]   Occupation: Therapist, sports  Tobacco Use   Smoking status: Former    Years: 6.00    Types: Cigarettes    Quit date: 12/28/1996    Years since quitting: 25.6   Smokeless tobacco: Never   Tobacco comments:    Exposed to second hand smoke  Vaping Use   Vaping Use: Never used  Substance and Sexual Activity   Alcohol use: No   Drug use: No   Sexual activity: Yes    Partners: Male    Birth control/protection: Surgical    Comment: 1st intercourse- 14, partners- 4, hysterectomy  Other Topics Concern   Not on file  Social History Narrative   Married with 3 children   3-4 cups caffeine per day   Exercises 2-3 times per week   Social Determinants of Health   Financial Resource Strain: Not on file  Food Insecurity: Not on file  Transportation Needs: Not on file  Physical Activity: Not on file  Stress: Not on file  Social Connections: Not on file     Family History: The patient's family history includes Alcohol abuse in her father; Allergies in an other family member; Anxiety disorder in her mother and another family member; Arthritis in her mother and another family member; Asthma in her sister and sister; Breast cancer (age of onset: 39) in her sister; COPD in her father and mother; Cancer in her father; Cancer (age of onset: 49) in her sister; Depression in her mother; Diabetes in her father and sister; Heart attack in her father; Heart disease in her father; Hyperlipidemia in an other family member; Obesity in an other family member; Sleep apnea in her sister. ROS:   Please see the history of present illness.    All 14 point review of systems negative except as described per history of present  illness  EKGs/Labs/Other Studies Reviewed:      Recent Labs: No results found for requested labs within last 365 days.  Recent Lipid Panel No results found for: "CHOL", "TRIG", "HDL", "CHOLHDL", "VLDL", "LDLCALC", "LDLDIRECT"  Physical Exam:    VS:  BP 130/72 (BP Location: Left Arm, Patient Position: Sitting)   Pulse 69   Ht '5\' 5"'$  (1.651 m)   Wt 233 lb (105.7 kg)   SpO2 96%   BMI 38.77 kg/m     Wt Readings from Last 3 Encounters:  08/04/22 233 lb (105.7 kg)  05/22/22 237 lb (107.5 kg)  02/20/22 236 lb (107 kg)     GEN:  Well nourished, well developed in no acute distress HEENT: Normal NECK: No JVD; No carotid bruits LYMPHATICS: No lymphadenopathy CARDIAC: RRR, no murmurs, no rubs, no gallops RESPIRATORY:  Clear to auscultation without rales, wheezing or rhonchi  ABDOMEN: Soft, non-tender, non-distended MUSCULOSKELETAL:  No edema; No deformity  SKIN: Warm and dry LOWER EXTREMITIES: no swelling NEUROLOGIC:  Alert and oriented x 3 PSYCHIATRIC:  Normal affect   ASSESSMENT:    1. Essential hypertension   2. Controlled type 2 diabetes mellitus without complication, without long-term current use of insulin (HCC)   3. Palpitations    PLAN:    In order of problems listed above:  Essential hypertension blood presently well controlled continue present management. Diabetes seems to be under control Palpitations denies having any. Cardiac wise she is doing fine if she is talking about potentially having gastric bypass surgery that should be no contrast cardiac contraindication for that procedure   Medication Adjustments/Labs and Tests Ordered: Current medicines are reviewed at length with the patient today.  Concerns regarding medicines are outlined above.  No orders of the defined types were placed in this encounter.  Medication changes: No orders of the defined types were placed in this encounter.   Signed, Park Liter, MD, [facility] 08/04/2022 2:55 PM    [place]

## 2022-08-04 NOTE — Patient Instructions (Signed)

## 2022-08-05 ENCOUNTER — Encounter (HOSPITAL_COMMUNITY): Payer: Self-pay | Admitting: Gastroenterology

## 2022-08-05 NOTE — Anesthesia Preprocedure Evaluation (Addendum)
Anesthesia Evaluation  Patient identified by MRN, date of birth, ID band Patient awake    Reviewed: Allergy & Precautions, NPO status , Patient's Chart, lab work & pertinent test results, reviewed documented beta blocker date and time   History of Anesthesia Complications (+) PONV and history of anesthetic complications  Airway Mallampati: II  TM Distance: >3 FB Neck ROM: Full    Dental no notable dental hx.    Pulmonary asthma , sleep apnea and Continuous Positive Airway Pressure Ventilation , COPD,  oxygen dependent, former smoker,  sarcoidosis   Pulmonary exam normal        Cardiovascular hypertension, Pt. on medications and Pt. on home beta blockers Normal cardiovascular exam     Neuro/Psych negative neurological ROS  negative psych ROS   GI/Hepatic Neg liver ROS, GERD  Controlled and Medicated,  Endo/Other  diabetes (on Ozempic)BMI 39  Renal/GU negative Renal ROS  negative genitourinary   Musculoskeletal  (+) Arthritis ,   Abdominal   Peds  Hematology negative hematology ROS (+)   Anesthesia Other Findings Day of surgery medications reviewed with patient.  Reproductive/Obstetrics negative OB ROS                           Anesthesia Physical Anesthesia Plan  ASA: 2  Anesthesia Plan: MAC   Post-op Pain Management: Minimal or no pain anticipated   Induction:   PONV Risk Score and Plan: Treatment may vary due to age or medical condition and Propofol infusion  Airway Management Planned: Natural Airway and Nasal Cannula  Additional Equipment: None  Intra-op Plan:   Post-operative Plan:   Informed Consent: I have reviewed the patients History and Physical, chart, labs and discussed the procedure including the risks, benefits and alternatives for the proposed anesthesia with the patient or authorized representative who has indicated his/her understanding and acceptance.        Plan Discussed with: CRNA  Anesthesia Plan Comments:        Anesthesia Quick Evaluation

## 2022-08-06 ENCOUNTER — Ambulatory Visit (HOSPITAL_COMMUNITY)
Admission: RE | Admit: 2022-08-06 | Discharge: 2022-08-06 | Disposition: A | Discharge status: Home / Self Care | Payer: BC Managed Care – PPO | Attending: Gastroenterology | Admitting: Gastroenterology

## 2022-08-06 ENCOUNTER — Other Ambulatory Visit: Payer: Self-pay

## 2022-08-06 ENCOUNTER — Encounter (HOSPITAL_COMMUNITY): Payer: Self-pay | Admitting: Gastroenterology

## 2022-08-06 ENCOUNTER — Ambulatory Visit (HOSPITAL_COMMUNITY): Payer: BC Managed Care – PPO | Admitting: Anesthesiology

## 2022-08-06 ENCOUNTER — Encounter (HOSPITAL_COMMUNITY)
Admission: RE | Disposition: A | Discharge status: Home / Self Care | Payer: Self-pay | Source: Home / Self Care | Attending: Gastroenterology

## 2022-08-06 DIAGNOSIS — E119 Type 2 diabetes mellitus without complications: Secondary | ICD-10-CM | POA: Diagnosis not present

## 2022-08-06 DIAGNOSIS — I10 Essential (primary) hypertension: Secondary | ICD-10-CM | POA: Diagnosis not present

## 2022-08-06 DIAGNOSIS — Z79899 Other long term (current) drug therapy: Secondary | ICD-10-CM | POA: Diagnosis not present

## 2022-08-06 DIAGNOSIS — Z860100 Personal history of colon polyps, unspecified: Secondary | ICD-10-CM

## 2022-08-06 DIAGNOSIS — K219 Gastro-esophageal reflux disease without esophagitis: Secondary | ICD-10-CM | POA: Insufficient documentation

## 2022-08-06 DIAGNOSIS — K621 Rectal polyp: Secondary | ICD-10-CM | POA: Diagnosis not present

## 2022-08-06 DIAGNOSIS — K5730 Diverticulosis of large intestine without perforation or abscess without bleeding: Secondary | ICD-10-CM | POA: Diagnosis not present

## 2022-08-06 DIAGNOSIS — K449 Diaphragmatic hernia without obstruction or gangrene: Secondary | ICD-10-CM | POA: Diagnosis not present

## 2022-08-06 DIAGNOSIS — D869 Sarcoidosis, unspecified: Secondary | ICD-10-CM | POA: Insufficient documentation

## 2022-08-06 DIAGNOSIS — Z9981 Dependence on supplemental oxygen: Secondary | ICD-10-CM | POA: Diagnosis not present

## 2022-08-06 DIAGNOSIS — K640 First degree hemorrhoids: Secondary | ICD-10-CM | POA: Diagnosis not present

## 2022-08-06 DIAGNOSIS — J449 Chronic obstructive pulmonary disease, unspecified: Secondary | ICD-10-CM | POA: Diagnosis not present

## 2022-08-06 DIAGNOSIS — Z87891 Personal history of nicotine dependence: Secondary | ICD-10-CM | POA: Diagnosis not present

## 2022-08-06 DIAGNOSIS — Z800 Family history of malignant neoplasm of digestive organs: Secondary | ICD-10-CM

## 2022-08-06 DIAGNOSIS — Z09 Encounter for follow-up examination after completed treatment for conditions other than malignant neoplasm: Secondary | ICD-10-CM | POA: Diagnosis not present

## 2022-08-06 DIAGNOSIS — J45909 Unspecified asthma, uncomplicated: Secondary | ICD-10-CM | POA: Diagnosis not present

## 2022-08-06 DIAGNOSIS — R1310 Dysphagia, unspecified: Secondary | ICD-10-CM | POA: Diagnosis present

## 2022-08-06 DIAGNOSIS — Z6839 Body mass index (BMI) 39.0-39.9, adult: Secondary | ICD-10-CM | POA: Insufficient documentation

## 2022-08-06 DIAGNOSIS — G4730 Sleep apnea, unspecified: Secondary | ICD-10-CM | POA: Insufficient documentation

## 2022-08-06 DIAGNOSIS — K581 Irritable bowel syndrome with constipation: Secondary | ICD-10-CM

## 2022-08-06 HISTORY — PX: COLONOSCOPY WITH PROPOFOL: SHX5780

## 2022-08-06 HISTORY — PX: BIOPSY: SHX5522

## 2022-08-06 HISTORY — PX: MALONEY DILATION: SHX5535

## 2022-08-06 HISTORY — PX: ESOPHAGOGASTRODUODENOSCOPY (EGD) WITH PROPOFOL: SHX5813

## 2022-08-06 HISTORY — PX: POLYPECTOMY: SHX5525

## 2022-08-06 LAB — GLUCOSE, CAPILLARY: Glucose-Capillary: 119 mg/dL — ABNORMAL HIGH (ref 70–99)

## 2022-08-06 SURGERY — ESOPHAGOGASTRODUODENOSCOPY (EGD) WITH PROPOFOL
Anesthesia: Monitor Anesthesia Care

## 2022-08-06 MED ORDER — PROPOFOL 500 MG/50ML IV EMUL
INTRAVENOUS | Status: AC
  Filled 2022-08-06: qty 50

## 2022-08-06 MED ORDER — ONDANSETRON HCL 4 MG/2ML IJ SOLN
INTRAMUSCULAR | Status: DC | PRN
  Administered 2022-08-06: 4 mg via INTRAVENOUS

## 2022-08-06 MED ORDER — EPHEDRINE SULFATE-NACL 50-0.9 MG/10ML-% IV SOSY
PREFILLED_SYRINGE | INTRAVENOUS | Status: DC | PRN
  Administered 2022-08-06 (×2): 5 mg via INTRAVENOUS

## 2022-08-06 MED ORDER — PROPOFOL 10 MG/ML IV BOLUS
INTRAVENOUS | Status: DC | PRN
  Administered 2022-08-06: 50 mg via INTRAVENOUS
  Administered 2022-08-06: 20 mg via INTRAVENOUS

## 2022-08-06 MED ORDER — LACTATED RINGERS IV SOLN
INTRAVENOUS | Status: AC | PRN
  Administered 2022-08-06: 20 mL/h via INTRAVENOUS

## 2022-08-06 MED ORDER — LIDOCAINE 2% (20 MG/ML) 5 ML SYRINGE
INTRAMUSCULAR | Status: DC | PRN
  Administered 2022-08-06: 100 mg via INTRAVENOUS

## 2022-08-06 MED ORDER — SODIUM CHLORIDE 0.9 % IV SOLN: INTRAVENOUS | Status: DC

## 2022-08-06 MED ORDER — PROPOFOL 500 MG/50ML IV EMUL
INTRAVENOUS | Status: DC | PRN
  Administered 2022-08-06: 150 ug/kg/min via INTRAVENOUS

## 2022-08-06 SURGICAL SUPPLY — 25 items

## 2022-08-06 NOTE — Transfer of Care (Signed)
Immediate Anesthesia Transfer of Care Note  Patient: Norma Lopez  Procedure(s) Performed: Procedure(s): ESOPHAGOGASTRODUODENOSCOPY (EGD) WITH PROPOFOL (N/A) COLONOSCOPY WITH PROPOFOL (N/A) BIOPSY MALONEY DILATION POLYPECTOMY  Patient Location: PACU  Anesthesia Type:MAC  Level of Consciousness: Patient easily awoken, sedated, comfortable, cooperative, following commands, responds to stimulation.   Airway & Oxygen Therapy: Patient spontaneously breathing, ventilating well, oxygen via simple oxygen mask.  Post-op Assessment: Report given to PACU RN, vital signs reviewed and stable, moving all extremities.   Post vital signs: Reviewed and stable.  Complications: No apparent anesthesia complications Last Vitals:  Vitals Value Taken Time  BP 101/69 08/06/22 0818  Temp    Pulse 68 08/06/22 0820  Resp 14 08/06/22 0820  SpO2 100 % 08/06/22 0820  Vitals shown include unvalidated device data.  Last Pain:  Vitals:   08/06/22 0648  TempSrc: Temporal  PainSc: 0-No pain         Complications: No notable events documented.

## 2022-08-06 NOTE — H&P (Addendum)
Chief Complaint: FU  Referring Provider:  No ref. provider found      ASSESSMENT AND PLAN;   #1. GERD with eso dysphagia  #2. H/O colonic polyps 05/2017. FH colon Ca (sis at age 61)  #3. IBS-C with occ fecal incontinence   Plan:  -EGD with dil/colon -Continue protonix '40mg'$  po QD -Run it by Jenny Reichmann Nulty(Takes O2 at night d/t OSA) for [facility] procedures. -Kegel exercises.    I discussed EGD/Colonoscopy- the indications, risks, alternatives and potential complications including, but not limited to bleeding, infection, reaction to meds, damage to internal organs, cardiac and/or pulmonary problems, and perforation requiring surgery. The possibility that significant findings could be missed was explained. All ? were answered. Pt consents to proceed.  HPI:    Norma Lopez is a 61 y.o. female  With multiple medical problems including H/O anxiety/depression, OSA (on CPAP and O2 at night), sarcoidosis Dx 2009 off MTX, previously on Remicade in 2018 - 02/2019 and started on Humira  02/2020, GERD, diverticulitis x 2 in 2014 and 2015, C. difficile colitis 06/2020 and colon polyps. S/P total hysterectomy. S/P left knee medial and lateral meniscus tears 02/26/2020, recent L hamstring repair 11/2020.  For follow-up visit.  Intermittent dysphagia-mostly to solids.  Heartburn is better on Protonix.  He is being considered for gastric sleeve at [facility] by Dr. Florene Glen.  No nausea/vomiting/regurgitation/odynophagia.  With longstanding history of intermittent constipation.  Better now.  Does pass pellet-like stools.  No melena or hematochezia.  Abdominal bloating which gets better with BMs.  Occ fecal incontinence.   Had left hamstring tear after fall in October.  Underwent surgical repair Dec 2021.   Has not been on any antibiotics recently.  No fever chills or night sweats.   Past GI procedures: Colonoscopy 05/2017: colonic polyp s/p polypectomy (TA), pancolonic diverticulosis   EGD 06/2008:  Healed distal esophageal erosions, small HH, irregular Z-line with neg bx for Barrett's, mild gastritis. S/P dil 48Fr. Neg SB Bx for celiac, neg CLO, neg eso Bx  Past Medical History:  Diagnosis Date   ADD (attention deficit disorder with hyperactivity)    Adnexal mass 10/15/2015   Age-related nuclear cataract of both eyes 01/13/2016   Allergic rhinitis    Anxiety and depression    Arthropathy of cervical facet joint 01/30/2019   Formatting of this note might be different from the original. Added automatically from request for surgery [id number]   Asthma    Atypical chest pain 12/04/2020   Bilateral carpal tunnel syndrome 01/13/2016   Chronic dryness of both eyes 01/13/2016   Chronic obstructive pulmonary disease ([place]) 02/03/2021   Cognitive complaints with normal neuropsychological exam 01/13/2016   Complete rupture of left proximal hamstring tendon 12/02/2020   Formatting of this note might be different from the original. Added automatically from request for surgery [id number]   Conjunctival concretions of both eyes 01/13/2016   Conjunctival hemorrhage of right eye 06/17/2016   COPD (chronic obstructive pulmonary disease) ([place])    Cough 04/26/2008   Qualifier: Diagnosis of  By: Royal Piedra NP, Tammy     Daytime somnolence 01/13/2016   Diabetes mellitus type 2, controlled ([place]) 01/13/2016   Diarrhea, functional    Diverticulosis    Dysphagia 07/27/2016   Dyspnea on exertion 01/13/2016   Dysrhythmia 2015   none since 2015   Edema 01/13/2016   Elevated liver enzymes 11/02/2017   Episodic tension-type headache, not intractable 03/10/2018   Essential hypertension 10/15/2015   Family history of colon  cancer    Fibrosis of lung ([place])    Gait disorder 07/30/2016   Generalized anxiety disorder 12/28/1988   GERD (gastroesophageal reflux disease)    History of pneumoconiosis    with ILD with nocturnal hypoxemia on 02 2 l/min   History of varicose veins 01/13/2016   Hyperlipidemia     HYPERLIPIDEMIA 04/20/2008   Qualifier: Diagnosis of  By: Joya Gaskins MD, Burnett Harry    Hypoxemia 01/13/2016   IBS (irritable bowel syndrome)    Immunocompromised state ([place]) 08/18/2017   Joint pain, knee 01/13/2016   Left groin hernia 01/13/2016   Leukopenia 11/15/2018   Low back pain without sciatica 07/30/2016   Lumbar herniated disc    Migraines    Multiple lung nodules on CT    Neck pain 07/30/2016   Neuromuscular disorder (HCC)    neuropathy feet   Nocturnal hypoxemia 04/20/2008   patient reports using CPAP machine at night Formatting of this note might be different from the original. Overview:  Qualifier: Diagnosis of  By: Annamaria Boots MD, Kasandra Knudsen  Formatting of this note might be different from the original. Overview:  Qualifier: Diagnosis of  By: Joya Gaskins MD, Christell Faith of this note might be different from the original. 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 Overview:  Qualifier: Diagnosis of  By: Joya Gaskins MD, Burnett Harry   PONV (postoperative nausea and vomiting)    Precordial chest pain 08/26/2018   Premature ventricular contraction 02/02/2011   none since 2015 Formatting of this note might be different from the original. Overview:  Qualifier: Diagnosis of  By: Via LPN, Stanford Breed 01/13/2016   Presbyopia 06/17/2016   PTSD (post-traumatic stress disorder)    Pulmonary fibrosis, unspecified ([place]) 01/13/2016   Rectal incontinence    Restless leg syndrome    S/P left knee arthroscopy 03/05/2020   Sarcoidosis    Severe episode of recurrent major depressive disorder, without psychotic features ([place]) 01/13/2016   Situational anxiety  01/15/2016   Sleep apnea    patient reports using CPAP machine at night   Spondylosis without myelopathy or radiculopathy, thoracic region 01/30/2019   Formatting of this note might be different from the original. Added automatically from request for surgery [id number]  Formatting of this note might be different from the original. Added automatically from request for surgery [id number] Formatting of this note might be different from the original. 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 Pancolonic diverticulosis predominantly in the sigmoid colon'   COLONOSCOPY WITH PROPOFOL N/A 08/06/2022   Procedure: COLONOSCOPY WITH PROPOFOL;  Surgeon:  Jackquline Denmark, MD;  Location: [facility];  Service: Gastroenterology;  Laterality: N/A;   ESOPHAGOGASTRODUODENOSCOPY  07/25/2008   Healed esophageal erosions. Irregular Z-line suggestive of gastroesophageal reflux (biopsied to rule out short-segment Barrett's esophagus) Mild gastritis.   ESOPHAGOGASTRODUODENOSCOPY (EGD) WITH PROPOFOL N/A 08/06/2022   Procedure: ESOPHAGOGASTRODUODENOSCOPY (EGD) WITH PROPOFOL;  Surgeon: Jackquline Denmark, MD;  Location: [facility];  Service: Gastroenterology;  Laterality: N/A;   HAMSTRING SURGERY  11/2020   ANCHOR BOLT    KNEE ARTHROSCOPY     x3   KNEE ARTHROSCOPY WITH MEDIAL MENISECTOMY Left 02/26/2020   Procedure: KNEE ARTHROSCOPY WITH MEDIAL MENISECTOMY;  Surgeon: Vickey Huger, MD;  Location: [facility];  Service:  Orthopedics;  Laterality: Left;   MALONEY DILATION  08/06/2022   Procedure: MALONEY DILATION;  Surgeon: Jackquline Denmark, MD;  Location: [facility];  Service: Gastroenterology;;   POLYPECTOMY  08/06/2022   Procedure: POLYPECTOMY;  Surgeon: Jackquline Denmark, MD;  Location: [facility];  Service: Gastroenterology;;   TONSILLECTOMY     TOTAL ABDOMINAL HYSTERECTOMY  1992   TUBAL LIGATION     veins stripped  Left 1989    Family History  Problem Relation Age of Onset   COPD Mother    Arthritis Mother    Depression Mother    Anxiety disorder Mother    COPD Father    Alcohol abuse Father    Heart disease Father    Diabetes Father    Cancer Father        lung   Heart attack Father    Asthma Sister    Cancer Sister 61       colon   Asthma Sister    Diabetes Sister    Breast cancer Sister 36   Sleep apnea Sister    Hyperlipidemia Other    Allergies Other    Obesity Other    Anxiety disorder Other    Arthritis Other     Social History   Tobacco Use   Smoking status: Former    Years: 6.00    Types: Cigarettes    Quit date: 12/28/1996    Years since quitting: 25.6   Smokeless tobacco: Never   Tobacco comments:    Exposed to second hand smoke  Vaping Use   Vaping Use: Never used  Substance Use Topics   Alcohol use: No   Drug use: No    No current facility-administered medications for this encounter.   Current Outpatient Medications  Medication Sig Dispense Refill   acetaminophen (TYLENOL) 500 MG tablet Take 500-1,000 mg by mouth every 6 (six) hours as needed for moderate pain or mild pain.     Adalimumab 40 MG/0.4ML PSKT Inject 40 mg into the skin once a week. Patient reports once weekly injections     Cholecalciferol (VITAMIN D-3 PO) Take 2,000 Units by mouth daily.     Coenzyme Q10 100 MG capsule Take 100 mg by mouth daily.     desvenlafaxine (PRISTIQ) 100 MG 24 hr tablet Take 1 tablet (100 mg total) by mouth daily. 30 tablet 0   estradiol (CLIMARA - DOSED IN MG/24 HR)  0.075 mg/24hr patch Place 1 patch (0.075 mg total) onto the skin once a week. Please specify directions, refills and quantity (Patient taking differently: Place 0.075 mg onto the skin every Saturday. Please specify directions, refills and quantity) 12 patch 4   Fluticasone Furoate 100 MCG/ACT AEPB Inhale 1 puff into the lungs at bedtime. ARNUITY ELLIPTA     L-Methylfolate 15 MG TABS Take 15  mg by mouth at bedtime. [place]     lisinopril (ZESTRIL) 20 MG tablet Take 10 mg by mouth at bedtime.     Magnesium Oxide 250 MG TABS Take 250 mg by mouth at bedtime.     metoprolol succinate (TOPROL-XL) 25 MG 24 hr tablet Take 12.5 mg by mouth every morning.     Omega-3 Fatty Acids (FISH OIL ULTRA) 1400 MG CAPS Take 2,000 mg by mouth 2 (two) times daily.     ONETOUCH ULTRA test strip 1 each by Other route as needed for other (Glucose).     OXYGEN Inhale 1 L into the lungs at bedtime.     pantoprazole (PROTONIX) 40 MG tablet Take 1 tablet (40 mg total) by mouth daily. (Patient taking differently: Take 40 mg by mouth at bedtime.) 90 tablet 4   pramipexole (MIRAPEX) 1.5 MG tablet Take 0.75 mg by mouth at bedtime.     promethazine (PHENERGAN) 25 MG tablet Take 25 mg by mouth every 6 (six) hours as needed for nausea.     rosuvastatin (CRESTOR) 10 MG tablet Take 10 mg by mouth at bedtime.  1   albuterol (VENTOLIN HFA) 108 (90 Base) MCG/ACT inhaler Inhale 2 puffs into the lungs every 6 (six) hours as needed for wheezing or shortness of breath.     diphenhydrAMINE (BENADRYL) 25 MG tablet Take 25 mg by mouth every 6 (six) hours as needed for itching or allergies (allergic reactions.).     meloxicam (MOBIC) 15 MG tablet Take 1 tablet (15 mg total) by mouth daily. 30 tablet 3   methylPREDNISolone (MEDROL DOSEPAK) 4 MG TBPK tablet 6 day dose pack - take as directed 21 tablet 0    Allergies  Allergen Reactions   Latex Itching, Rash and Other (See Comments)    Blisters     Pedi-Pre Tape Spray [Wound Dressing Adhesive]  Rash    redness   Pregabalin     Other reaction(s): Other (See Comments) "anxious"   Azithromycin Palpitations   Budesonide-Formoterol Fumarate Palpitations    Tachycardia Other reaction(s): Other (See Comments)   Gadolinium Other (See Comments)    Sneezing/scratchy throat/redness on chest   Shellfish Allergy Rash    SHRIMP   Wellbutrin [Bupropion] Palpitations    Tachycardia     Review of Systems:  neg     Physical Exam:    BP 125/64   Pulse 65   Temp (!) 97.5 F (36.4 C) (Temporal)   Resp 15   SpO2 95%  Wt Readings from Last 3 Encounters:  08/04/22 105.7 kg  05/22/22 107.5 kg  02/20/22 107 kg   Constitutional:  Well-developed, in no acute distress. Psychiatric: Normal mood and affect. Behavior is normal. HEENT: Pupils normal.  Conjunctivae are normal. No scleral icterus. Cardiovascular: Normal rate, regular rhythm. No edema Pulmonary/chest: Effort normal and breath sounds normal. No wheezing, rales or rhonchi. Abdominal: Soft, nondistended. Nontender. Bowel sounds active throughout. There are no masses palpable. No hepatomegaly.  Incisional umbilical hernia Neurological: Alert and oriented to person place and time. Skin: Skin is warm and dry. No rashes noted.  Data Reviewed: I have personally reviewed following labs and imaging studies  CBC:    Latest Ref Rng & Units 05/26/2021    1:00 PM 10/09/2020   12:02 PM 05/08/2020   11:23 AM  CBC  WBC 4.0 - 10.5 K/uL 4.1  6.3  3.7   Hemoglobin 12.0 - 15.0 g/dL 14.5  14.4  14.7   Hematocrit 36.0 - 46.0 %  43.9  42.6  44.2   Platelets 150 - 400 K/uL 251  317.0  290.0     CMP:    Latest Ref Rng & Units 05/26/2021    1:00 PM 12/10/2020    2:33 PM 04/30/2020    3:37 PM  CMP  Glucose 70 - 99 mg/dL 117  84  93   BUN 6 - 20 mg/dL '15  15  16   '$ Creatinine 0.44 - 1.00 mg/dL 0.89  0.87  0.88   Sodium 135 - 145 mmol/L 137  142  138   Potassium 3.5 - 5.1 mmol/L 3.9  4.1  3.9   Chloride 98 - 111 mmol/L 103  104  105   CO2  22 - 32 mmol/L '27  26  27   '$ Calcium 8.9 - 10.3 mg/dL 9.1  8.8  8.7   Total Protein 6.5 - 8.1 g/dL 7.2   6.6   Total Bilirubin 0.3 - 1.2 mg/dL 0.4   0.3   Alkaline Phos 38 - 126 U/L 102   60   AST 15 - 41 U/L 36   19   ALT 0 - 44 U/L 50   20        Radiology Studies: DG Foot Complete Right  Result Date: 08/11/2022 Please see detailed radiograph report in office note.  DG Foot Complete Left  Result Date: 08/11/2022 Please see detailed radiograph report in office note.      Carmell Austria, MD 08/15/2022, 2:08 PM  Cc: No ref. provider found  Jackquline Denmark, MD Physician Gastroenterology Progress Notes    Signed Encounter Date:  05/22/2022   Signed                                                                                                                                                                                                    Chief Complaint: FU   Referring Provider:  Verdell Carmine., MD        ASSESSMENT AND PLAN;    #1. GERD with eso dysphagia   #2. H/O colonic polyps 05/2017. FH colon Ca (sis at age 46)   #3. IBS-C with occ fecal incontinence     Plan:   -EGD with dil/colon -Continue protonix '40mg'$  po QD -Run it by Jenny Reichmann Nulty(Takes O2 at night d/t OSA) for L[facility] procedures. -Kegel exercises.       I discussed EGD/Colonoscopy- the indications, risks, alternatives and potential complications including, but not limited to bleeding, infection, reaction to meds, damage to internal organs, cardiac and/or pulmonary problems, and perforation requiring surgery. The possibility that  significant findings could be missed was explained. All ? were answered. Pt consents to proceed.   HPI:     Norma Lopez is a 61 y.o. female  With multiple medical problems including H/O anxiety/depression, OSA (on CPAP and O2 at night), sarcoidosis Dx 2009 off MTX, previously on Remicade in 2018 - 02/2019 and started  on Humira  02/2020, GERD, diverticulitis x 2 in 2014 and 2015, C. difficile colitis 06/2020 and colon polyps. S/P total hysterectomy. S/P left knee medial and lateral meniscus tears 02/26/2020, recent L hamstring repair 11/2020.   For follow-up visit.   Intermittent dysphagia-mostly to solids.  Heartburn is better on Protonix.  He is being considered for gastric sleeve at [facility] by Dr. Florene Glen.  No nausea/vomiting/regurgitation/odynophagia.   With longstanding history of intermittent constipation.  Better now.  Does pass pellet-like stools.  No melena or hematochezia.  Abdominal bloating which gets better with BMs.   Occ fecal incontinence.     Had left hamstring tear after fall in October.  Underwent surgical repair Dec 2021.    Has not been on any antibiotics recently.   No fever chills or night sweats.     Past GI procedures: Colonoscopy 05/2017: colonic polyp s/p polypectomy (TA), pancolonic diverticulosis   EGD 06/2008: Healed distal esophageal erosions, small HH, irregular Z-line with neg bx for Barrett's, mild gastritis. S/P dil 48Fr. Neg SB Bx for celiac, neg CLO, neg eso Bx       Past Medical History:  Diagnosis Date   ADD (attention deficit disorder with hyperactivity)     Adnexal mass 10/15/2015   Age-related nuclear cataract of both eyes 01/13/2016   Allergic rhinitis     ALLERGIC RHINITIS 06/11/2008    Qualifier: Diagnosis of  By: Annamaria Boots MD, Clinton D    Allergy     Anxiety and depression     Arthropathy of cervical facet joint 01/30/2019    Formatting of this note might be different from the original. Added automatically from request for surgery [id number]   Asthma     ASTHMA 04/20/2008    Qualifier: Diagnosis of  By: Joya Gaskins MD, Burnett Harry    Atypical chest pain 12/04/2020   Bilateral carpal tunnel syndrome 01/13/2016   Chronic dryness of both eyes 01/13/2016   Chronic obstructive pulmonary disease ([place]) 02/03/2021   Cognitive complaints with normal neuropsychological  exam 01/13/2016   Complete detachment of hamstring muscle     Complete rupture of left proximal hamstring tendon 12/02/2020    Formatting of this note might be different from the original. Added automatically from request for surgery [id number]   Conjunctival concretions of both eyes 01/13/2016   Conjunctival hemorrhage of right eye 06/17/2016   COPD (chronic obstructive pulmonary disease) ([place])     Cough 04/26/2008    Qualifier: Diagnosis of  By: Royal Piedra NP, Tammy     Daytime somnolence 01/13/2016   Diabetes ([place])     Diabetes mellitus type 2, controlled ([place]) 01/13/2016   Diarrhea, functional     Diverticulosis     Dysphagia 07/27/2016   Dyspnea on exertion 01/13/2016   Dysrhythmia 2015    none since 2015   Edema 01/13/2016   Elevated liver enzymes 11/02/2017   Episodic tension-type headache, not intractable 03/10/2018   Essential hypertension 10/15/2015   Family history of colon cancer     Fibrosis of lung ([place])     Gait disorder 07/30/2016   Gastroesophageal reflux disease 04/20/2008    Formatting of this note  might be different from the original. Overview:  Qualifier: Diagnosis of  By: Burt Knack CMA, Lori   Generalized anxiety disorder 12/28/1988   GERD 04/20/2008    Qualifier: Diagnosis of  By: Burt Knack CMA, Cecille Rubin     GERD (gastroesophageal reflux disease)     History of pneumoconiosis      with ILD with nocturnal hypoxemia on 02 2 l/min   History of varicose veins 01/13/2016   Hyperlipidemia     HYPERLIPIDEMIA 04/20/2008    Qualifier: Diagnosis of  By: Joya Gaskins MD, Burnett Harry    Hypoxemia 01/13/2016   IBS (irritable bowel syndrome)     Immunocompromised state ([place]) 08/18/2017   Joint pain, knee 01/13/2016   Left groin hernia 01/13/2016   Leukopenia 11/15/2018   Low back pain without sciatica 07/30/2016   Lumbar herniated disc     Migraines     Mild intermittent asthma 10/15/2015   Multiple lung nodules on CT     Neck pain 07/30/2016   Neuromuscular disorder (HCC)       neuropathy feet   Neuropathy      left eye   Nocturnal hypoxemia 04/20/2008    patient reports using CPAP machine at night Formatting of this note might be different from the original. Overview:  Qualifier: Diagnosis of  By: Annamaria Boots MD, Clinton D  Formatting of this note might be different from the original. Overview:  Qualifier: Diagnosis of  By: Joya Gaskins MD, Christell Faith of this note might be different from the original. 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 Overview:  Qualifier: Diagnosis of  By: Joya Gaskins MD, Burnett Harry   PONV (postoperative nausea and vomiting)     PONV (postoperative nausea and vomiting)     Precordial chest pain 08/26/2018   Premature ventricular contraction 02/02/2011    none since 2015 Formatting of this note might be different from the original. 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   Presbyopia 06/17/2016   PTSD (post-traumatic stress disorder)     Pulmonary fibrosis, unspecified ([place]) 01/13/2016   Rectal incontinence     Restless leg syndrome     S/P left knee arthroscopy 03/05/2020   Sarcoidosis     Severe episode of recurrent major depressive disorder, without psychotic features ([place]) 01/13/2016   Situational anxiety 01/15/2016   Sleep apnea      patient reports using CPAP machine at night   SLEEP APNEA 11/11/2008    Qualifier: Diagnosis of  By: Annamaria Boots MD, Clinton D    Spondylosis without myelopathy or radiculopathy,  thoracic region 01/30/2019    Formatting of this note might be different from the original. Added automatically from request for surgery [id number]  Formatting of this note might be different from the original. Added automatically from request for surgery [id number] Formatting of this note might be different from the original. 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 Pancolonic diverticulosis predominantly in the sigmoid colon'   ESOPHAGOGASTRODUODENOSCOPY   07/25/2008    Healed esophageal erosions. Irregular Z-line suggestive of gastroesophageal reflux (biopsied to rule out short-segment Barrett's esophagus) Mild gastritis.   HAMSTRING SURGERY   11/2020    ANCHOR BOLT    KNEE ARTHROSCOPY        x3   KNEE ARTHROSCOPY WITH MEDIAL MENISECTOMY Left 02/26/2020    Procedure: KNEE ARTHROSCOPY WITH MEDIAL MENISECTOMY;  Surgeon: Vickey Huger, MD;  Location: [facility];  Service: Orthopedics;  Laterality: Left;   TONSILLECTOMY       TOTAL ABDOMINAL HYSTERECTOMY   1992   TUBAL LIGATION       veins stripped  Left 1989           Family History  Problem Relation Age of Onset   COPD Mother     Arthritis Mother     Depression Mother     Anxiety disorder Mother     COPD Father     Alcohol abuse Father     Heart disease Father     Diabetes Father     Cancer Father          lung   Heart attack Father     Asthma Sister     Cancer Sister 34        colon   Asthma Sister      Diabetes Sister     Breast cancer Sister 59   Sleep apnea Sister     Hyperlipidemia Other     Allergies Other     Obesity Other     Anxiety disorder Other     Arthritis Other        Social History         Tobacco Use   Smoking status: Former      Years: 6.00      Types: Cigarettes      Quit date: 12/28/1996      Years since quitting: 25.4   Smokeless tobacco: Never   Tobacco comments:      Exposed to second hand smoke  Vaping Use   Vaping Use: Never used  Substance Use Topics   Alcohol use: No   Drug use: No            Current Outpatient Medications  Medication Sig Dispense Refill   acetaminophen (TYLENOL) 500 MG tablet Take 500-1,000 mg by mouth every 6 (six) hours as needed (for pain.).       Adalimumab 40 MG/0.4ML PSKT Inject 40 mg into the skin once a week. Patient reports once weekly injections       albuterol (VENTOLIN HFA) 108 (90 Base) MCG/ACT inhaler Inhale 2 puffs into the lungs every 6 (six) hours as needed for wheezing or shortness of breath.       busPIRone (BUSPAR) 10 MG tablet Take 10  mg by mouth 2 (two) times daily.       Cholecalciferol (VITAMIN D-3) 125 MCG (5000 UT) TABS Take 5,000 Units by mouth daily.       Coenzyme Q10 100 MG capsule Take 10 mg by mouth.       desvenlafaxine (PRISTIQ) 100 MG 24 hr tablet Take 1 tablet (100 mg total) by mouth daily. 30 tablet 0   diphenhydrAMINE (BENADRYL) 25 MG tablet Take 25 mg by mouth every 6 (six) hours as needed for itching or allergies (allergic reactions.).       estradiol (CLIMARA - DOSED IN MG/24 HR) 0.075 mg/24hr patch Place 1 patch (0.075 mg total) onto the skin once a week. Please specify directions, refills and quantity 12 patch 4   famotidine (PEPCID) 10 MG tablet daily as needed.       Fluticasone Furoate 100 MCG/ACT AEPB Inhale 1 puff into the lungs at bedtime. ARNUITY ELLIPTA       L-Methylfolate 15 MG TABS Take 15 mg by mouth at bedtime.        lisinopril (ZESTRIL) 10 MG tablet         metoprolol  succinate (TOPROL-XL) 25 MG 24 hr tablet Take 25 mg by mouth daily.       mycophenolate (CELLCEPT) 500 MG tablet Take 500 mg by mouth 2 (two) times daily. Takes 3 tablets ( 1500 MG ) daily       Omega-3 Fatty Acids (FISH OIL ULTRA) 1400 MG CAPS Take 1,400 mg by mouth daily.       ONETOUCH ULTRA test strip 1 each by Other route as needed for other (Glucose).       OXYGEN Inhale 2 L into the lungs at bedtime.        OZEMPIC, 0.25 OR 0.5 MG/DOSE, 2 MG/1.5ML SOPN Inject 0.5 mg into the skin once a week.       pantoprazole (PROTONIX) 40 MG tablet Take 1 tablet (40 mg total) by mouth daily. Please call [phone] to schedule an office visit for more refills 30 tablet 2   rosuvastatin (CRESTOR) 10 MG tablet Take 10 mg by mouth every other day.   1    No current facility-administered medications for this visit.           Allergies  Allergen Reactions   Gadolinium Itching, Rash, Shortness Of Breath, Swelling and Tinitus   Latex Itching, Rash and Other (See Comments)      Blisters       Pedi-Pre Tape Spray [Wound Dressing Adhesive] Rash      redness   Pregabalin        Other reaction(s): Other (See Comments) "anxious"   Azithromycin Palpitations   Budesonide-Formoterol Fumarate Palpitations      Tachycardia Other reaction(s): Other (See Comments)   Shellfish Allergy Rash      SHRIMP   Wellbutrin [Bupropion] Palpitations      Tachycardia        Review of Systems:  neg       Physical Exam:     BP 132/82   Pulse 79   Ht 5' 4.75" (1.645 m)   Wt 237 lb (107.5 kg)   SpO2 95%   BMI 39.74 kg/m     Wt Readings from Last 3 Encounters:  05/22/22 237 lb (107.5 kg)  02/20/22 236 lb (107 kg)  10/28/21 242 lb 3.2 oz (109.9 kg)    Constitutional:  Well-developed, in no acute distress. Psychiatric: Normal mood and affect. Behavior is normal.  HEENT: Pupils normal.  Conjunctivae are normal. No scleral icterus. Cardiovascular: Normal rate, regular rhythm. No edema Pulmonary/chest:  Effort normal and breath sounds normal. No wheezing, rales or rhonchi. Abdominal: Soft, nondistended. Nontender. Bowel sounds active throughout. There are no masses palpable. No hepatomegaly.  Incisional umbilical hernia Neurological: Alert and oriented to person place and time. Skin: Skin is warm and dry. No rashes noted.   Data Reviewed: I have personally reviewed following labs and imaging studies   CBC:     Latest Ref Rng & Units 05/26/2021    1:00 PM 10/09/2020   12:02 PM 05/08/2020   11:23 AM  CBC  WBC 4.0 - 10.5 K/uL 4.1   6.3   3.7    Hemoglobin 12.0 - 15.0 g/dL 14.5   14.4   14.7    Hematocrit 36.0 - 46.0 % 43.9   42.6   44.2    Platelets 150 - 400 K/uL 251   317.0   290.0        CMP:     Latest Ref Rng & Units 05/26/2021    1:00 PM 12/10/2020    2:33 PM 04/30/2020    3:37 PM  CMP  Glucose 70 - 99 mg/dL 117   84   93    BUN 6 - 20 mg/dL '15   15   16    '$ Creatinine 0.44 - 1.00 mg/dL 0.89   0.87   0.88    Sodium 135 - 145 mmol/L 137   142   138    Potassium 3.5 - 5.1 mmol/L 3.9   4.1   3.9    Chloride 98 - 111 mmol/L 103   104   105    CO2 22 - 32 mmol/L '27   26   27    '$ Calcium 8.9 - 10.3 mg/dL 9.1   8.8   8.7    Total Protein 6.5 - 8.1 g/dL 7.2     6.6    Total Bilirubin 0.3 - 1.2 mg/dL 0.4     0.3    Alkaline Phos 38 - 126 U/L 102     60    AST 15 - 41 U/L 36     19    ALT 0 - 44 U/L 50     20              Radiology Studies: No results found.         Carmell Austria, MD 05/22/2022, 2:50 PM   Cc: Verdell Carmine., MD            Electronically signed by Jackquline Denmark, MD at 05/22/2022  4:40 PM Office Visit on 05/22/2022  Office Visit on 05/22/2022   Detailed Report  Note shared with patient Additional Documentation  Vitals:  BP 132/82 Pulse 79 Ht 5' 4.75" (1.645 m) Wt 107.5 kg SpO2 95% BMI 39.74 kg/m BSA 2.22 m  More Vitals  Flowsheets:  Anthropometrics, NEWS, MEWS Score   Encounter Info:  Billing Info, History, Allergies, Detailed  Report    Orders Placed  Ambulatory referral to Gastroenterology Authorized Procedural/ Surgical Case Request: ESOPHAGOGASTRODUODENOSCOPY (EGD) WITH PROPOFOL, COLONOSCOPY WITH PROPOFOL Once Medication Changes   Lisinopril    (Change in therapy)  20 MG Tabs, Take 10 mg by mouth at bedtime.    (Completed Course)  carBAMazepine (Antipsychotic)    (Completed Course)      (Completed Course)  Patient not taking:  Reported on 02/20/2022    (Completed Course)    (  Completed Course)    (Completed Course)    (Change in therapy) Medication List Visit Diagnoses   Gastroesophageal reflux disease, unspecified whether esophagitis present  Dysphagia, unspecified type  History of colonic polyps  Family history of colon cancer  Irritable bowel syndrome with constipation Problem List

## 2022-08-06 NOTE — Discharge Instructions (Addendum)
YOU HAD AN ENDOSCOPIC PROCEDURE TODAY: Refer to the procedure report and other information in the discharge instructions given to you for any specific questions about what was found during the examination. If this information does not answer your questions, please call [place] office at [phone] to clarify.  ° °YOU SHOULD EXPECT: Some feelings of bloating in the abdomen. Passage of more gas than usual. Walking can help get rid of the air that was put into your GI tract during the procedure and reduce the bloating. If you had a lower endoscopy (such as a colonoscopy or flexible sigmoidoscopy) you may notice spotting of blood in your stool or on the toilet paper. Some abdominal soreness may be present for a day or two, also. ° °DIET: Your first meal following the procedure should be a light meal and then it is ok to progress to your normal diet. A half-sandwich or bowl of soup is an example of a good first meal. Heavy or fried foods are harder to digest and may make you feel nauseous or bloated. Drink plenty of fluids but you should avoid alcoholic beverages for 24 hours. If you had a esophageal dilation, please see attached instructions for diet.   ° °ACTIVITY: Your care partner should take you home directly after the procedure. You should plan to take it easy, moving slowly for the rest of the day. You can resume normal activity the day after the procedure however YOU SHOULD NOT DRIVE, use power tools, machinery or perform tasks that involve climbing or major physical exertion for 24 hours (because of the sedation medicines used during the test).  ° °SYMPTOMS TO REPORT IMMEDIATELY: °A gastroenterologist can be reached at any hour. Please call [phone]  for any of the following symptoms:  °Following lower endoscopy (colonoscopy, flexible sigmoidoscopy) °Excessive amounts of blood in the stool  °Significant tenderness, worsening of abdominal pains  °Swelling of the abdomen that is new, acute  °Fever of 100° or  higher  °Following upper endoscopy (EGD, EUS, ERCP, esophageal dilation) °Vomiting of blood or coffee ground material  °New, significant abdominal pain  °New, significant chest pain or pain under the shoulder blades  °Painful or persistently difficult swallowing  °New shortness of breath  °Black, tarry-looking or red, bloody stools ° °FOLLOW UP:  °If any biopsies were taken you will be contacted by phone or by letter within the next 1-3 weeks. Call [phone]  if you have not heard about the biopsies in 3 weeks.  °Please also call with any specific questions about appointments or follow up tests. ° °

## 2022-08-06 NOTE — Op Note (Signed)
[facility] Patient Name: Norma Lopez Procedure Date: 08/06/2022 MRN: [id number] Attending MD: Jackquline Denmark , MD Date of Birth: May 12, 1961 CSN: [id number] Age: 61 Admit Type: Outpatient Procedure:                Colonoscopy Indications:              H/O colonic polyps 05/2017. FH colon Ca (sis at age                            39) Providers:                Jackquline Denmark, MD, Benay Pillow, RN, Despina Pole, Technician Referring MD:              Medicines:                Monitored Anesthesia Care Complications:            No immediate complications. Estimated Blood Loss:     Estimated blood loss: none. Procedure:                Pre-Anesthesia Assessment:                           - Prior to the procedure, a History and Physical                            was performed, and patient medications and                            allergies were reviewed. The patient's tolerance of                            previous anesthesia was also reviewed. The risks                            and benefits of the procedure and the sedation                            options and risks were discussed with the patient.                            All questions were answered, and informed consent                            was obtained. Prior Anticoagulants: The patient has                            taken no previous anticoagulant or antiplatelet                            agents. ASA Grade Assessment: II - A patient with                            mild  systemic disease. After reviewing the risks                            and benefits, the patient was deemed in                            satisfactory condition to undergo the procedure.                           After obtaining informed consent, the colonoscope                            was passed under direct vision. Throughout the                            procedure, the patient's blood pressure, pulse, and                             oxygen saturations were monitored continuously. The                            [id number]-HQ190L ([id number]) Olympus colonoscope was                            introduced through the anus and advanced to the 2                            cm into the ileum. The colonoscopy was performed                            without difficulty. The patient tolerated the                            procedure well. The quality of the bowel                            preparation was good. The terminal ileum, ileocecal                            valve, appendiceal orifice, and rectum were                            photographed. Scope In: 8:01:38 AM Scope Out: 8:14:00 AM Scope Withdrawal Time: 0 hours 9 minutes 6 seconds  Total Procedure Duration: 0 hours 12 minutes 22 seconds  Findings:      A 6 mm polyp was found in the rectum. The polyp was sessile. The polyp       was removed with a cold snare. Resection and retrieval were complete.      Multiple medium-mouthed diverticula were found in the sigmoid colon,       descending colon and few in ascending colon.      Non-bleeding internal hemorrhoids were found during retroflexion. The       hemorrhoids were small and Grade I (internal hemorrhoids that do not  prolapse).      The terminal ileum appeared normal.      The exam was otherwise without abnormality on direct and retroflexion       views. Impression:               - One 6 mm polyp in the rectum, removed with a cold                            snare. Resected and retrieved.                           - Pancolonic diverticulosis predominantly in the                            sigmoid colon.                           - Non-bleeding internal hemorrhoids.                           - The examined portion of the ileum was normal.                           - The examination was otherwise normal on direct                            and retroflexion views. Moderate Sedation:      Not  Applicable - Patient had care per Anesthesia. Recommendation:           - Patient has a contact number available for                            emergencies. The signs and symptoms of potential                            delayed complications were discussed with the                            patient. Return to normal activities tomorrow.                            Written discharge instructions were provided to the                            patient.                           - High fiber diet.                           - Continue present medications.                           - Await pathology results.                           - Repeat colonoscopy for surveillance based  on                            pathology results.                           - The findings and recommendations were discussed                            with the patient's family. Procedure Code(s):        --- Professional ---                           [phone], Colonoscopy, flexible; with removal of                            tumor(s), polyp(s), or other lesion(s) by snare                            technique Diagnosis Code(s):        --- Professional ---                           Z83.71, Family history of colonic polyps                           K62.1, Rectal polyp                           K64.0, First degree hemorrhoids                           K57.30, Diverticulosis of large intestine without                            perforation or abscess without bleeding CPT copyright 2019 [employer]. All rights reserved. The codes documented in this report are preliminary and upon coder review may  be revised to meet current compliance requirements. Jackquline Denmark, MD 08/06/2022 8:20:30 AM This report has been signed electronically. Number of Addenda: 0

## 2022-08-06 NOTE — Anesthesia Procedure Notes (Signed)
Procedure Name: MAC Date/Time: 08/06/2022 7:42 AM  Performed by: Deliah Boston, CRNAPre-anesthesia Checklist: Patient identified, Emergency Drugs available, Suction available and Patient being monitored Patient Re-evaluated:Patient Re-evaluated prior to induction Oxygen Delivery Method: Simple face mask Preoxygenation: Pre-oxygenation with 100% oxygen Placement Confirmation: positive ETCO2 and breath sounds checked- equal and bilateral

## 2022-08-06 NOTE — Op Note (Signed)
[facility] Patient Name: Norma Lopez Procedure Date: 08/06/2022 MRN: [id number] Attending MD: Jackquline Denmark , MD Date of Birth: 1961-11-15 CSN: [id number] Age: 61 Admit Type: Outpatient Procedure:                Upper GI endoscopy Indications:              Dysphagia. GERD Providers:                Jackquline Denmark, MD, Benay Pillow, RN, Despina Pole, Technician Referring MD:              Medicines:                Monitored Anesthesia Care Complications:            No immediate complications. Estimated Blood Loss:     Estimated blood loss: none. Procedure:                Pre-Anesthesia Assessment:                           - Prior to the procedure, a History and Physical                            was performed, and patient medications and                            allergies were reviewed. The patient's tolerance of                            previous anesthesia was also reviewed. The risks                            and benefits of the procedure and the sedation                            options and risks were discussed with the patient.                            All questions were answered, and informed consent                            was obtained. Prior Anticoagulants: The patient has                            taken no previous anticoagulant or antiplatelet                            agents. ASA Grade Assessment: II - A patient with                            mild systemic disease. After reviewing the risks                            and  benefits, the patient was deemed in                            satisfactory condition to undergo the procedure.                           After obtaining informed consent, the endoscope was                            passed under direct vision. Throughout the                            procedure, the patient's blood pressure, pulse, and                            oxygen saturations were monitored  continuously. The                            [id number]-[id number] ([id number]) Olympus endoscope was introduced                            through the mouth, and advanced to the second part                            of duodenum. The upper GI endoscopy was                            accomplished without difficulty. The patient                            tolerated the procedure well. Scope In: Scope Out: Findings:      The examined esophagus was normal. Biopsies were obtained from the       proximal and distal esophagus with cold forceps for histology of       suspected eosinophilic esophagitis. The scope was withdrawn. Dilation       was performed with a Maloney dilator with mild resistance at 52 Fr.      A minimal hiatal hernia was present.      The entire examined stomach was normal. Biopsies were taken with a cold       forceps for histology.      The examined duodenum was normal. Impression:               -Minimal hiatal hernia.                           -Otherwise normal EGD. S/p empiric esophageal                            dilatation. Moderate Sedation:      Not Applicable - Patient had care per Anesthesia. Recommendation:           - Patient has a contact number available for                            emergencies. The signs and symptoms of potential  delayed complications were discussed with the                            patient. Return to normal activities tomorrow.                            Written discharge instructions were provided to the                            patient.                           - Post dilatation diet.                           - Continue present medications.                           - Await pathology results.                           - The findings and recommendations were discussed                            with the patient's family. Procedure Code(s):        --- Professional ---                           [phone],  Esophagogastroduodenoscopy, flexible,                            transoral; with biopsy, single or multiple                           43450, Dilation of esophagus, by unguided sound or                            bougie, single or multiple passes Diagnosis Code(s):        --- Professional ---                           K44.9, Diaphragmatic hernia without obstruction or                            gangrene                           R13.10, Dysphagia, unspecified CPT copyright 2019 American [employer] Association. All rights reserved. The codes documented in this report are preliminary and upon coder review may  be revised to meet current compliance requirements. Jackquline Denmark, MD 08/06/2022 7:56:37 AM This report has been signed electronically. Number of Addenda: 0

## 2022-08-06 NOTE — Anesthesia Postprocedure Evaluation (Signed)
Anesthesia Post Note  Patient: Norma Lopez  Procedure(s) Performed: ESOPHAGOGASTRODUODENOSCOPY (EGD) WITH PROPOFOL COLONOSCOPY WITH PROPOFOL BIOPSY [place] POLYPECTOMY     Patient location during evaluation: PACU Anesthesia Type: MAC Level of consciousness: awake and alert Pain management: pain level controlled Vital Signs Assessment: post-procedure vital signs reviewed and stable Respiratory status: spontaneous breathing, nonlabored ventilation and respiratory function stable Cardiovascular status: blood pressure returned to baseline Postop Assessment: no apparent nausea or vomiting Anesthetic complications: no   No notable events documented.  Last Vitals:  Vitals:   08/06/22 0821 08/06/22 0830  BP: 114/65 105/74  Pulse: 67 71  Resp: 13 19  Temp:    SpO2: 100% 97%    Last Pain:  Vitals:   08/06/22 0830  TempSrc:   PainSc: 0-No pain                 Marthenia Rolling

## 2022-08-07 LAB — SURGICAL PATHOLOGY

## 2022-08-10 ENCOUNTER — Encounter: Payer: Self-pay | Admitting: Gastroenterology

## 2022-08-11 ENCOUNTER — Ambulatory Visit (INDEPENDENT_AMBULATORY_CARE_PROVIDER_SITE_OTHER): Payer: BC Managed Care – PPO

## 2022-08-11 ENCOUNTER — Encounter: Payer: Self-pay | Admitting: Podiatry

## 2022-08-11 ENCOUNTER — Ambulatory Visit: Payer: BC Managed Care – PPO | Admitting: Podiatry

## 2022-08-11 DIAGNOSIS — M7750 Other enthesopathy of unspecified foot: Secondary | ICD-10-CM

## 2022-08-11 DIAGNOSIS — M722 Plantar fascial fibromatosis: Secondary | ICD-10-CM | POA: Diagnosis not present

## 2022-08-11 DIAGNOSIS — M2041 Other hammer toe(s) (acquired), right foot: Secondary | ICD-10-CM

## 2022-08-11 DIAGNOSIS — M7752 Other enthesopathy of left foot: Secondary | ICD-10-CM | POA: Diagnosis not present

## 2022-08-11 MED ORDER — MELOXICAM 15 MG PO TABS: 15.0000 mg | ORAL_TABLET | Freq: Every day | ORAL | 3 refills | Status: DC

## 2022-08-11 MED ORDER — METHYLPREDNISOLONE 4 MG PO TBPK: ORAL_TABLET | ORAL | 0 refills | Status: DC

## 2022-08-11 MED ORDER — TRIAMCINOLONE ACETONIDE 40 MG/ML IJ SUSP
20.0000 mg | Freq: Once | INTRAMUSCULAR | Status: AC
  Administered 2022-08-11: 20 mg

## 2022-08-11 NOTE — Progress Notes (Signed)
Subjective:  Patient ID: Norma Lopez, female    DOB: [birth year],  MRN: [id number] HPI Chief Complaint  Patient presents with   Foot Pain    Plantar heel right - aching x 4 weeks, throbbing at night, tried tylenol and ibuprofen  Lateral foot left - intermittent pain 5th met base   New Patient (Initial Visit)    Est pt 2019    61 y.o. female presents with the above complaint.   ROS: Denies fever chills nausea vomiting muscle aches pains calf pain back pain chest pain shortness of breath.  Past Medical History:  Diagnosis Date   ADD (attention deficit disorder with hyperactivity)    Adnexal mass 10/15/2015   Age-related nuclear cataract of both eyes 01/13/2016   Allergic rhinitis    Anxiety and depression    Arthropathy of cervical facet joint 01/30/2019   Formatting of this note might be different from the original. Added automatically from request for surgery [id number]   Asthma    Atypical chest pain 12/04/2020   Bilateral carpal tunnel syndrome 01/13/2016   Chronic dryness of both eyes 01/13/2016   Chronic obstructive pulmonary disease ([place]) 02/03/2021   Cognitive complaints with normal neuropsychological exam 01/13/2016   Complete rupture of left proximal hamstring tendon 12/02/2020   Formatting of this note might be different from the original. Added automatically from request for surgery [id number]   Conjunctival concretions of both eyes 01/13/2016   Conjunctival hemorrhage of right eye 06/17/2016   COPD (chronic obstructive pulmonary disease) ([place])    Cough 04/26/2008   Qualifier: Diagnosis of  By: Royal Piedra NP, Tammy     Daytime somnolence 01/13/2016   Diabetes mellitus type 2, controlled ([place]) 01/13/2016   Diarrhea, functional    Diverticulosis    Dysphagia 07/27/2016   Dyspnea on exertion 01/13/2016   Dysrhythmia 2015   none since 2015   Edema 01/13/2016   Elevated liver enzymes 11/02/2017   Episodic tension-type headache, not intractable 03/10/2018   Essential  hypertension 10/15/2015   Family history of colon cancer    Fibrosis of lung ([place])    Gait disorder 07/30/2016   Generalized anxiety disorder 12/28/1988   GERD (gastroesophageal reflux disease)    History of pneumoconiosis    with ILD with nocturnal hypoxemia on 02 2 l/min   History of varicose veins 01/13/2016   Hyperlipidemia    HYPERLIPIDEMIA 04/20/2008   Qualifier: Diagnosis of  By: Joya Gaskins MD, Burnett Harry    Hypoxemia 01/13/2016   IBS (irritable bowel syndrome)    Immunocompromised state ([place]) 08/18/2017   Joint pain, knee 01/13/2016   Left groin hernia 01/13/2016   Leukopenia 11/15/2018   Low back pain without sciatica 07/30/2016   Lumbar herniated disc    Migraines    Multiple lung nodules on CT    Neck pain 07/30/2016   Neuromuscular disorder (H[facility])    neuropathy feet   Nocturnal hypoxemia 04/20/2008   patient reports using CPAP machine at night Formatting of this note might be different from the original. Overview:  Qualifier: Diagnosis of  By: Annamaria Boots MD, Clinton D  Formatting of this note might be different from the original. Overview:  Qualifier: Diagnosis of  By: Joya Gaskins MD, Christell Faith of this note might be different from the original. 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 Overview:  Qualifier: Diagnosis of  By: Joya Gaskins MD, Burnett Harry   PONV (postoperative nausea and vomiting)    Precordial chest pain 08/26/2018   Premature ventricular contraction 02/02/2011   none since 2015 Formatting of this note might be different from the original. Overview:  Qualifier: Diagnosis of  By: Via LPN, Stanford Breed 01/13/2016   Presbyopia  06/17/2016   PTSD (post-traumatic stress disorder)    Pulmonary fibrosis, unspecified ([place]) 01/13/2016   Rectal incontinence    Restless leg syndrome    S/P left knee arthroscopy 03/05/2020   Sarcoidosis    Severe episode of recurrent major depressive disorder, without psychotic features ([place]) 01/13/2016   Situational anxiety 01/15/2016   Sleep apnea    patient reports using CPAP machine at night   Spondylosis without myelopathy or radiculopathy, thoracic region 01/30/2019   Formatting of this note might be different from the original. Added automatically from request for surgery [phone]  Formatting of this note might be different from the original. Added automatically from request for surgery [id number] Formatting of this note might be different from the original. 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 Pancolonic diverticulosis predominantly in the sigmoid colon'   COLONOSCOPY WITH PROPOFOL N/A 08/06/2022   Procedure: COLONOSCOPY WITH PROPOFOL;  Surgeon: Jackquline Denmark, MD;  Location: [facility];  Service: Gastroenterology;  Laterality: N/A;   ESOPHAGOGASTRODUODENOSCOPY  07/25/2008   Healed esophageal erosions. Irregular Z-line suggestive of gastroesophageal reflux (biopsied to rule out short-segment Barrett's esophagus) Mild gastritis.   ESOPHAGOGASTRODUODENOSCOPY (EGD) WITH PROPOFOL N/A 08/06/2022   Procedure: ESOPHAGOGASTRODUODENOSCOPY (EGD) WITH PROPOFOL;  Surgeon: Jackquline Denmark, MD;  Location: [facility];  Service: Gastroenterology;  Laterality: N/A;   HAMSTRING SURGERY  11/2020   ANCHOR BOLT    KNEE ARTHROSCOPY     x3   KNEE ARTHROSCOPY WITH MEDIAL MENISECTOMY Left 02/26/2020   Procedure: KNEE ARTHROSCOPY WITH MEDIAL MENISECTOMY;  Surgeon: Vickey Huger, MD;  Location: [facility];  Service: Orthopedics;  Laterality: Left;   MALONEY DILATION  08/06/2022   Procedure: MALONEY DILATION;  Surgeon: Jackquline Denmark, MD;  Location: [facility];  Service: Gastroenterology;;   POLYPECTOMY  08/06/2022   Procedure: POLYPECTOMY;  Surgeon: Jackquline Denmark, MD;  Location: [facility];  Service: Gastroenterology;;   TONSILLECTOMY     TOTAL ABDOMINAL HYSTERECTOMY  1992   TUBAL LIGATION     veins stripped  Left 1989    Current Outpatient Medications:    meloxicam (MOBIC) 15 MG tablet, Take 1 tablet (15 mg total) by mouth daily., Disp: 30 tablet, Rfl: 3   methylPREDNISolone (MEDROL DOSEPAK) 4 MG TBPK tablet, 6 day dose pack - take as directed, Disp: 21 tablet, Rfl: 0   acetaminophen (TYLENOL) 500 MG tablet, Take 500-1,000 mg by mouth  every 6 (six) hours as needed for moderate pain or mild pain., Disp: , Rfl:    Adalimumab 40 MG/0.4ML PSKT, Inject 40 mg into the skin once a week. Patient reports once weekly injections, Disp: , Rfl:    albuterol (VENTOLIN HFA) 108 (90 Base) MCG/ACT inhaler, Inhale 2 puffs into the lungs every 6 (six) hours as needed for wheezing or shortness of breath., Disp: , Rfl:    Cholecalciferol (VITAMIN D-3 PO), Take 2,000 Units by mouth daily., Disp: , Rfl:    Coenzyme Q10 100 MG capsule, Take 100 mg by mouth daily., Disp: , Rfl:    desvenlafaxine (PRISTIQ) 100 MG 24 hr tablet, Take 1 tablet (100 mg total) by mouth daily., Disp: 30 tablet, Rfl: 0   diphenhydrAMINE (BENADRYL) 25 MG tablet, Take 25 mg by mouth every 6 (six) hours as needed for itching or allergies (allergic reactions.)., Disp: , Rfl:    estradiol (CLIMARA - DOSED IN MG/24 HR) 0.075 mg/24hr  patch, Place 1 patch (0.075 mg total) onto the skin once a week. Please specify directions, refills and quantity (Patient taking differently: Place 0.075 mg onto the skin every Saturday. Please specify directions, refills and quantity), Disp: 12 patch, Rfl: 4   Fluticasone Furoate 100 MCG/ACT AEPB, Inhale 1 puff into the lungs at bedtime. ARNUITY ELLIPTA, Disp: , Rfl:    L-Methylfolate 15 MG TABS, Take 15 mg by mouth at bedtime. [place], Disp: , Rfl:    lisinopril (ZESTRIL) 20 MG tablet, Take 10 mg by mouth at bedtime., Disp: , Rfl:    Magnesium Oxide 250 MG TABS, Take 250 mg by mouth at bedtime., Disp: , Rfl:    metoprolol succinate (TOPROL-XL) 25 MG 24 hr tablet, Take 12.5 mg by mouth every morning., Disp: , Rfl:    Omega-3 Fatty Acids (FISH OIL ULTRA) 1400 MG CAPS, Take 2,000 mg by mouth 2 (two) times daily., Disp: , Rfl:    ONETOUCH ULTRA test strip, 1 each by Other route as needed for other (Glucose)., Disp: , Rfl:    OXYGEN, Inhale 1 L into the lungs at bedtime., Disp: , Rfl:    pantoprazole (PROTONIX) 40 MG tablet, Take 1 tablet (40 mg total) by mouth daily. (Patient taking differently: Take 40 mg by mouth at bedtime.), Disp: 90 tablet, Rfl: 4   pramipexole (MIRAPEX) 1.5 MG tablet, Take 0.75 mg by mouth at bedtime., Disp: , Rfl:    promethazine (PHENERGAN) 25 MG tablet, Take 25 mg by mouth every 6 (six) hours as needed for nausea., Disp: , Rfl:    rosuvastatin (CRESTOR) 10 MG tablet, Take 10 mg by mouth at bedtime., Disp: , Rfl: 1  Allergies  Allergen Reactions   Latex Itching, Rash and Other (See Comments)    Blisters     Pedi-Pre Tape Spray [Wound Dressing Adhesive] Rash    redness   Pregabalin     Other reaction(s): Other (See Comments) "anxious"   Azithromycin Palpitations   Budesonide-Formoterol Fumarate Palpitations    Tachycardia Other reaction(s): Other (See Comments)   Gadolinium Other (See Comments)    Sneezing/scratchy throat/redness on chest   Shellfish Allergy Rash     SHRIMP   Wellbutrin [Bupropion] Palpitations    Tachycardia    Review of Systems Objective:  There were no vitals filed for this visit.  General: Well developed, nourished, in no acute distress, alert and oriented x3   Dermatological: Skin is warm, dry and supple bilateral. Nails x 10 are well maintained; remaining integument appears unremarkable  at this time. There are no open sores, no preulcerative lesions, no rash or signs of infection present.  Vascular: Dorsalis Pedis artery and Posterior Tibial artery pedal pulses are 2/4 bilateral with immedate capillary fill time. Pedal hair growth present. No varicosities and no lower extremity edema present bilateral.   Neruologic: Grossly intact via light touch bilateral. Vibratory intact via tuning fork bilateral. Protective threshold with Semmes Wienstein monofilament intact to all pedal sites bilateral. Patellar and Achilles deep tendon reflexes 2+ bilateral. No Babinski or clonus noted bilateral.   Musculoskeletal: No gross boney pedal deformities bilateral. No pain, crepitus, or limitation noted with foot and ankle range of motion bilateral. Muscular strength 5/5 in all groups tested bilateral.  Moderate to severe pain on palpation medial calcaneal tubercle of the right heel.  No pain on medial-lateral compression of the calcaneus she does have some thickness and tenderness on palpation of the fifth metatarsal base of the left foot.  No erythema no signs of infection.  Gait: Unassisted, Nonantalgic.    Radiographs:  Radiographs taken today demonstrate soft tissue increase in density plantar fascial Caney insertion site osseously mature individual.  Plantar distally oriented calcaneal heel spurs as well as posterior approximately oriented calcaneal spurs are present.  Consistent with Planter fasciitis.  Assessment & Plan:   Assessment: Planter fasciitis right.  Plan: Injected the right heel today 20 mg Kenalog milligrams Marcaine  point maximal tenderness.  Start her Medrol Dosepak to be followed by meloxicam.  Also placed on plantar fascia brace to be followed by a night splint.  Discussed appropriate shoe gear stretching exercise ice therapy and shoe modifications.     Masiah Lewing T. [place], [place]

## 2022-08-15 ENCOUNTER — Encounter: Payer: Self-pay | Admitting: Gastroenterology

## 2022-09-10 ENCOUNTER — Ambulatory Visit: Payer: BC Managed Care – PPO | Admitting: Podiatry

## 2023-01-26 ENCOUNTER — Other Ambulatory Visit: Payer: Self-pay | Admitting: Obstetrics & Gynecology

## 2023-01-26 DIAGNOSIS — Z1231 Encounter for screening mammogram for malignant neoplasm of breast: Secondary | ICD-10-CM

## 2023-02-24 ENCOUNTER — Ambulatory Visit: Payer: BC Managed Care – PPO | Admitting: Obstetrics & Gynecology

## 2023-03-10 DIAGNOSIS — R278 Other lack of coordination: Secondary | ICD-10-CM | POA: Insufficient documentation

## 2023-03-10 DIAGNOSIS — M7072 Other bursitis of hip, left hip: Secondary | ICD-10-CM

## 2023-03-10 DIAGNOSIS — N319 Neuromuscular dysfunction of bladder, unspecified: Secondary | ICD-10-CM | POA: Insufficient documentation

## 2023-03-10 HISTORY — DX: Other bursitis of hip, left hip: M70.72

## 2023-03-10 HISTORY — DX: Other lack of coordination: R27.8

## 2023-03-10 HISTORY — DX: Neuromuscular dysfunction of bladder, unspecified: N31.9

## 2023-03-16 ENCOUNTER — Ambulatory Visit
Admission: RE | Admit: 2023-03-16 | Discharge: 2023-03-16 | Disposition: A | Discharge status: Home / Self Care | Payer: BC Managed Care – PPO | Source: Ambulatory Visit | Attending: Obstetrics & Gynecology | Admitting: Obstetrics & Gynecology

## 2023-03-16 DIAGNOSIS — N644 Mastodynia: Secondary | ICD-10-CM | POA: Insufficient documentation

## 2023-03-16 DIAGNOSIS — Z1231 Encounter for screening mammogram for malignant neoplasm of breast: Secondary | ICD-10-CM

## 2023-03-16 HISTORY — DX: Mastodynia: N64.4

## 2023-03-17 ENCOUNTER — Other Ambulatory Visit: Payer: Self-pay | Admitting: Obstetrics & Gynecology

## 2023-03-17 DIAGNOSIS — N644 Mastodynia: Secondary | ICD-10-CM

## 2023-04-27 ENCOUNTER — Ambulatory Visit: Payer: BC Managed Care – PPO | Admitting: Obstetrics & Gynecology

## 2023-05-05 ENCOUNTER — Ambulatory Visit: Admission: RE | Admit: 2023-05-05 | Payer: BC Managed Care – PPO | Source: Ambulatory Visit

## 2023-05-05 ENCOUNTER — Ambulatory Visit
Admission: RE | Admit: 2023-05-05 | Discharge: 2023-05-05 | Disposition: A | Discharge status: Home / Self Care | Payer: BC Managed Care – PPO | Source: Ambulatory Visit | Attending: Obstetrics & Gynecology | Admitting: Obstetrics & Gynecology

## 2023-05-05 DIAGNOSIS — N644 Mastodynia: Secondary | ICD-10-CM

## 2023-05-12 DIAGNOSIS — S31831A Laceration without foreign body of anus, initial encounter: Secondary | ICD-10-CM

## 2023-05-12 DIAGNOSIS — Z79890 Hormone replacement therapy (postmenopausal): Secondary | ICD-10-CM

## 2023-05-12 HISTORY — DX: Laceration without foreign body of anus, initial encounter: S31.831A

## 2023-05-13 MED ORDER — ESTRADIOL 0.075 MG/24HR TD PTWK
0.0750 mg | MEDICATED_PATCH | TRANSDERMAL | 0 refills | Status: AC
Start: 2023-05-13 — End: ?

## 2023-05-13 NOTE — Telephone Encounter (Signed)
Last AEX 02/20/22 w/ ML--scheduled for 07/28/2023 w/ JC. Last mammo 05/05/2023-neg birads 1  Rx pend.

## 2023-06-10 ENCOUNTER — Ambulatory Visit (INDEPENDENT_AMBULATORY_CARE_PROVIDER_SITE_OTHER): Payer: BC Managed Care – PPO

## 2023-06-10 ENCOUNTER — Encounter: Payer: Self-pay | Admitting: Podiatry

## 2023-06-10 ENCOUNTER — Ambulatory Visit (INDEPENDENT_AMBULATORY_CARE_PROVIDER_SITE_OTHER): Payer: BC Managed Care – PPO | Admitting: Podiatry

## 2023-06-10 DIAGNOSIS — M7752 Other enthesopathy of left foot: Secondary | ICD-10-CM

## 2023-06-10 DIAGNOSIS — D2372 Other benign neoplasm of skin of left lower limb, including hip: Secondary | ICD-10-CM

## 2023-06-10 MED ORDER — DEXAMETHASONE SODIUM PHOSPHATE 120 MG/30ML IJ SOLN
2.0000 mg | Freq: Once | INTRAMUSCULAR | Status: AC
Start: 2023-06-10 — End: 2023-06-10
  Administered 2023-06-10: 2 mg via INTRA_ARTICULAR

## 2023-06-10 NOTE — Progress Notes (Signed)
She presents today chief complaint of fifth met base is aching is some redness and swelling she is callused area is been like this for a few months as tender walking also has a real tender soft not on the lower leg.  Objective: Vital signs stable alert oriented x 3 the knot on the lower leg demonstrates what is most likely a rupture of the small vein starting to develop into a venous lake.  She also has pain on palpation of the fifth metatarsal base at the styloid process.  Radiographs taken today demonstrate a small spur approximately oriented along the insertion of the peroneus brevis.  Does not demonstrate any type of fracture or demineralized area.  Assessment: Probable bursitis of the fifth metatarsal base possibly secondary to longstanding Planter fasciitis.  Plan: I injected the area today with dexamethasone local anesthetic tolerated procedure well discussed appropriate shoe gear discussed the possibility of injecting her heel next visit.

## 2023-06-21 DIAGNOSIS — N393 Stress incontinence (female) (male): Secondary | ICD-10-CM | POA: Insufficient documentation

## 2023-06-21 HISTORY — DX: Stress incontinence (female) (male): N39.3

## 2023-07-14 ENCOUNTER — Other Ambulatory Visit: Payer: Self-pay | Admitting: Obstetrics & Gynecology

## 2023-07-14 DIAGNOSIS — Z79890 Hormone replacement therapy (postmenopausal): Secondary | ICD-10-CM

## 2023-07-15 NOTE — Telephone Encounter (Signed)
Medication refill request: estradiol patch Last AEX:  02-20-22 Next AEX: 07-28-23 Last MMG (if hormonal medication request): 05-05-23 Refill authorized: last refilled 05-13-23 # 12 with 0 refills. Patient should have enough to last until her appt. Rx denied

## 2023-07-28 ENCOUNTER — Ambulatory Visit: Payer: BC Managed Care – PPO | Admitting: Radiology

## 2023-08-17 ENCOUNTER — Encounter: Payer: Self-pay | Admitting: Cardiology

## 2023-08-17 ENCOUNTER — Ambulatory Visit: Payer: BC Managed Care – PPO | Attending: Cardiology | Admitting: Cardiology

## 2023-08-17 VITALS — BP 143/88 | HR 78 | Ht 64.5 in | Wt 205.4 lb

## 2023-08-17 DIAGNOSIS — E119 Type 2 diabetes mellitus without complications: Secondary | ICD-10-CM | POA: Diagnosis not present

## 2023-08-17 DIAGNOSIS — D869 Sarcoidosis, unspecified: Secondary | ICD-10-CM | POA: Diagnosis not present

## 2023-08-17 DIAGNOSIS — R0609 Other forms of dyspnea: Secondary | ICD-10-CM

## 2023-08-17 DIAGNOSIS — I10 Essential (primary) hypertension: Secondary | ICD-10-CM

## 2023-08-17 NOTE — Patient Instructions (Signed)
Medication Instructions:  Your physician recommends that you continue on your current medications as directed. Please refer to the Current Medication list given to you today.  *If you need a refill on your cardiac medications before your next appointment, please call your pharmacy*   Lab Work: None Ordered If you have labs (blood work) drawn today and your tests are completely normal, you will receive your results only by: MyChart Message (if you have MyChart) OR A paper copy in the mail If you have any lab test that is abnormal or we need to change your treatment, we will call you to review the results.   Testing/Procedures: None Ordered   Follow-Up: At [facility], you and your health needs are our priority.  As part of our continuing mission to provide you with exceptional heart care, we have created designated Provider Care Teams.  These Care Teams include your primary Cardiologist (physician) and Advanced Practice Providers (APPs -  Physician Assistants and Nurse Practitioners) who all work together to provide you with the care you need, when you need it.  We recommend signing up for the patient portal called "MyChart".  Sign up information is provided on this After Visit Summary.  MyChart is used to connect with patients for Virtual Visits (Telemedicine).  Patients are able to view lab/test results, encounter notes, upcoming appointments, etc.  Non-urgent messages can be sent to your provider as well.   To learn more about what you can do with MyChart, go to [email or web address].    Your next appointment:   4 month(s)  The format for your next appointment:   In Person  Provider:   Robert Krasowski, MD    Other Instructions NA  

## 2023-08-17 NOTE — Addendum Note (Signed)
Addended by: Baldo Ash D on: 08/17/2023 03:01 PM   Modules accepted: Orders

## 2023-08-17 NOTE — Progress Notes (Signed)
Cardiology Office Note:    Date:  08/17/2023   ID:  Norma Lopez, DOB [birth year], MRN [id number]  PCP:  Raynelle Jan., MD  Cardiologist:  Gypsy Balsam, MD    Referring MD: Raynelle Jan., MD   Chief Complaint  Patient presents with   Hospitalization Follow-up    08/09/2023 [facility]     History of Present Illness:    Norma Lopez is a 62 y.o. female with past medical history significant for sarcoidosis with pulmonary involvement, palpitations with PVCs-suppressed with beta-blocker, essential hypertension, diabetes, since have seen her last time she did have abdominal surgery to reduce her stomach she lost 50 pounds since that time and then later she required some pelvic floor surgery because of some incontinence.  Did quite well doing okay feeling better.  Have permission to start exercising on the regular basis she is looking forward to it.  She did have some pain in the right side of her chest.  She went to hospital CT of her chest was done she was found to have some small pericardial effusion pain since that time was subsided.  She thinks that the pain was related to cough.  Biochemical markers were negative  Past Medical History:  Diagnosis Date   ADD (attention deficit disorder with hyperactivity)    Adnexal mass 10/15/2015   Age-related nuclear cataract of both eyes 01/13/2016   Allergic rhinitis    Anxiety and depression    Arthropathy of cervical facet joint 01/30/2019   Formatting of this note might be different from the original. Added automatically from request for surgery [id number]   Asthma    Atypical chest pain 12/04/2020   Bilateral carpal tunnel syndrome 01/13/2016   Chronic dryness of both eyes 01/13/2016   Chronic obstructive pulmonary disease (HCC) 02/03/2021   Cognitive complaints with normal neuropsychological exam 01/13/2016   Complete rupture of left proximal hamstring tendon 12/02/2020   Formatting of this note might be different from the  original. Added automatically from request for surgery [id number]   Conjunctival concretions of both eyes 01/13/2016   Conjunctival hemorrhage of right eye 06/17/2016   COPD (chronic obstructive pulmonary disease) (HCC)    Cough 04/26/2008   Qualifier: Diagnosis of  By: Clent Ridges NP, Tammy     Daytime somnolence 01/13/2016   Diabetes mellitus type 2, controlled (HCC) 01/13/2016   Diarrhea, functional    Diverticulosis    Dysphagia 07/27/2016   Dyspnea on exertion 01/13/2016   Dysrhythmia 2015   none since 2015   Edema 01/13/2016   Elevated liver enzymes 11/02/2017   Episodic tension-type headache, not intractable 03/10/2018   Essential hypertension 10/15/2015   Family history of colon cancer    Fibrosis of lung (HCC)    Gait disorder 07/30/2016   Generalized anxiety disorder 12/28/1988   GERD (gastroesophageal reflux disease)    History of pneumoconiosis    with ILD with nocturnal hypoxemia on 02 2 l/min   History of varicose veins 01/13/2016   Hyperlipidemia    HYPERLIPIDEMIA 04/20/2008   Qualifier: Diagnosis of  By: Delford Field MD, Charlcie Cradle    Hypoxemia 01/13/2016   IBS (irritable bowel syndrome)    Immunocompromised state (HCC) 08/18/2017   Joint pain, knee 01/13/2016   Left groin hernia 01/13/2016   Leukopenia 11/15/2018   Low back pain without sciatica 07/30/2016   Lumbar herniated disc    Migraines    Multiple lung nodules on CT    Neck pain 07/30/2016   Neuromuscular  disorder (HCC)    neuropathy feet   Nocturnal hypoxemia 04/20/2008   patient reports using CPAP machine at night Formatting of this note might be different from the original. Overview:  Qualifier: Diagnosis of  By: Maple Hudson MD, Rennis Chris  Formatting of this note might be different from the original. Overview:  Qualifier: Diagnosis of  By: Delford Field MD, Cline Cools of this note might be different from the original. 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 Overview:  Qualifier: Diagnosis of  By: Delford Field MD, Charlcie Cradle   PONV (postoperative nausea and vomiting)    Precordial chest pain 08/26/2018   Premature ventricular contraction 02/02/2011   none since 2015 Formatting of this note might be different from the original. Overview:  Qualifier: Diagnosis of  By: Via LPN, Ardis Rowan 01/13/2016   Presbyopia 06/17/2016   PTSD (post-traumatic stress disorder)    Pulmonary fibrosis, unspecified (HCC) 01/13/2016   Rectal incontinence    Restless leg syndrome    S/P left knee arthroscopy 03/05/2020   Sarcoidosis    Severe episode of recurrent major depressive disorder, without psychotic features (HCC) 01/13/2016   Situational anxiety 01/15/2016   Sleep apnea    patient reports using CPAP machine at night   Spondylosis without myelopathy or radiculopathy, thoracic region 01/30/2019   Formatting of this note might be different from the original. Added automatically from request for surgery [id number]  Formatting of this note might be different from the original. Added automatically from request for surgery [id number] Formatting of this note might be different from the original. 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 Pancolonic diverticulosis predominantly in the sigmoid colon'   COLONOSCOPY WITH PROPOFOL N/A 08/06/2022   Procedure: COLONOSCOPY WITH PROPOFOL;  Surgeon: Lynann Bologna, MD;  Location: [facility];  Service: Gastroenterology;  Laterality: N/A;   ESOPHAGOGASTRODUODENOSCOPY  07/25/2008   Healed esophageal erosions. Irregular Z-line suggestive of gastroesophageal reflux (biopsied to rule out short-segment Barrett's esophagus) Mild gastritis.   ESOPHAGOGASTRODUODENOSCOPY (EGD) WITH PROPOFOL N/A 08/06/2022   Procedure: ESOPHAGOGASTRODUODENOSCOPY (EGD) WITH PROPOFOL;  Surgeon: Lynann Bologna, MD;  Location: [facility];  Service: Gastroenterology;  Laterality: N/A;   HAMSTRING SURGERY  11/2020   ANCHOR BOLT    KNEE ARTHROSCOPY     x3   KNEE ARTHROSCOPY WITH MEDIAL MENISECTOMY Left 02/26/2020   Procedure: KNEE ARTHROSCOPY WITH MEDIAL MENISECTOMY;  Surgeon: Dannielle Huh, MD;  Location: [facility];  Service: Orthopedics;  Laterality: Left;   MALONEY DILATION  08/06/2022   Procedure: Elease Hashimoto DILATION;  Surgeon: Lynann Bologna, MD;  Location:  [facility];  Service: Gastroenterology;;   POLYPECTOMY  08/06/2022   Procedure: POLYPECTOMY;  Surgeon: Lynann Bologna, MD;  Location: [facility];  Service: Gastroenterology;;   TONSILLECTOMY     TOTAL ABDOMINAL HYSTERECTOMY  1992   TUBAL LIGATION     veins stripped  Left 1989    Current Medications: Current Meds  Medication Sig   acetaminophen (TYLENOL) 500 MG tablet Take 500-1,000 mg by mouth every 6 (six) hours as needed for moderate pain or mild pain.   Adalimumab 40 MG/0.4ML PSKT Inject 40 mg into the skin once a week. Patient reports once weekly injections   albuterol (VENTOLIN HFA) 108 (90 Base) MCG/ACT inhaler Inhale 2 puffs into the lungs every 6 (six) hours as needed for wheezing or shortness of breath.   Cholecalciferol (VITAMIN D-3 PO) Take 2,000 Units by mouth daily.   Coenzyme Q10  100 MG capsule Take 100 mg by mouth daily.   desvenlafaxine (PRISTIQ) 100 MG 24 hr tablet Take 1 tablet (100 mg total) by mouth daily.   diphenhydrAMINE (BENADRYL) 25 MG tablet Take 25 mg by mouth every 6 (six) hours as needed for itching or allergies (allergic reactions.).   estradiol (CLIMARA - DOSED IN MG/24 HR) 0.075 mg/24hr patch Place 1 patch (0.075 mg total) onto the skin once a week. Please specify directions, refills and quantity   Fluticasone Furoate 100 MCG/ACT AEPB Inhale 1 puff into the lungs at bedtime. ARNUITY ELLIPTA   L-Methylfolate 15 MG TABS Take 15 mg by mouth at bedtime. Deplin   lisinopril (ZESTRIL) 20 MG tablet Take 10 mg by mouth at bedtime.   Magnesium Oxide 250 MG TABS Take 250 mg by mouth at bedtime.   meloxicam (MOBIC) 15 MG tablet Take 1 tablet (15 mg total) by mouth daily.   methylPREDNISolone (MEDROL DOSEPAK) 4 MG TBPK tablet 6 day dose pack - take as directed (Patient taking differently: Take 4 mg by mouth as directed. 6 day dose pack - take as directed)   metoprolol succinate (TOPROL-XL) 25 MG 24 hr tablet Take 12.5 mg by mouth every morning.   Omega-3 Fatty Acids (FISH OIL ULTRA) 1400 MG CAPS Take 2,000 mg by mouth 2 (two) times daily.   ONETOUCH ULTRA test strip 1 each by Other route as needed for other (Glucose).   OXYGEN Inhale 1 L into the lungs at bedtime.   pantoprazole (PROTONIX) 40 MG tablet Take 1 tablet (40 mg total) by mouth daily. (Patient taking differently: Take 40 mg by mouth at bedtime.)   pramipexole (MIRAPEX) 1.5 MG tablet Take 0.75 mg by mouth at bedtime.   promethazine (PHENERGAN) 25 MG tablet Take 25 mg by mouth every 6 (six) hours as needed for nausea.   rosuvastatin (CRESTOR) 10 MG tablet Take 10 mg by mouth at bedtime.     Allergies:   Latex, Nickel, Infliximab, Pregabalin, Azithromycin, Gadolinium, and Wellbutrin [bupropion]   Social History   Socioeconomic History   Marital status: Married    Spouse name: Not on file   Number of  children: Not on file   Years of education: Not on file   Highest education level: Not on file  Occupational History    Employer: [employer]   Occupation: RN  Tobacco Use   Smoking status: Former    Current packs/day: 0.00    Types: Cigarettes    Start date: 12/28/1990    Quit date: 12/28/1996    Years since quitting: 26.6   Smokeless tobacco: Never   Tobacco comments:  Exposed to second hand smoke  Vaping Use   Vaping status: Never Used  Substance and Sexual Activity   Alcohol use: No   Drug use: No   Sexual activity: Yes    Partners: Male    Birth control/protection: Surgical    Comment: 1st intercourse- 14, partners- 5, hysterectomy  Other Topics Concern   Not on file  Social History Narrative   Married with 3 children   3-4 cups caffeine per day   Exercises 2-3 times per week   Social Determinants of Health   Financial Resource Strain: Low Risk  (12/04/2021)   [facility] prior to 02/27/2023., [facility] visits prior to 02/27/2023.   Overall Financial Resource Strain (CARDIA)    Difficulty of Paying Living Expenses: Not hard at all  Food Insecurity: Low Risk  (04/21/2023)   [facility]   Food vital sign    Within the past 12 months, you worried that your food would run out before you got money to buy more: Never true    Within the past 12 months, the food you bought just didn't last and you didn't have money to get more. : Never true  Transportation Needs: No Transportation Needs (04/21/2023)   [facility]   Transportation    In the past 12 months, has lack of reliable transportation kept you from medical appointments, meetings, work or from getting things needed for daily living? : No  Physical Activity: Insufficiently Active (12/04/2021)   Received from [facility] visits prior to 02/27/2023., [facility] visits  prior to 02/27/2023.   Exercise Vital Sign    Days of Exercise per Week: 2 days    Minutes of Exercise per Session: 10 min  Stress: Stress Concern Present (12/04/2021)   [facility] prior to 02/27/2023., [facility] visits prior to 02/27/2023.   [employer] of Occupational Health - Occupational Stress Questionnaire    Feeling of Stress : To some extent  Social Connections: Socially Integrated (12/04/2021)   Received from [facility] visits prior to 02/27/2023., [facility] visits prior to 02/27/2023.   Social Advertising account executive [NHANES]    Frequency of Communication with Friends and Family: More than three times a week    Frequency of Social Gatherings with Friends and Family: Twice a week    Attends Religious Services: 1 to 4 times per year    Active Member of [employer] or Organizations: Yes    Attends Banker Meetings: 1 to 4 times per year    Marital Status: Married     Family History: The patient's family history includes Alcohol abuse in her father; Allergies in an other family member; Anxiety disorder in her mother and another family member; Arthritis in her mother and another family member; Asthma in her sister and sister; Breast cancer (age of onset: 110) in her sister; COPD in her father and mother; Cancer in her father; Cancer (age of onset: 22) in her sister; Depression in her mother; Diabetes in her father and sister; Heart attack in her father; Heart disease in her father; Hyperlipidemia in an other family member; Obesity in an other family member; Sleep apnea in her sister. ROS:   Please see the history of present illness.    All 14 point review  of systems negative except as described per history of present illness  EKGs/Labs/Other Studies Reviewed:    EKG Interpretation Date/Time:  Tuesday August 17 2023 14:21:00 EDT Ventricular Rate:  78 PR  Interval:  188 QRS Duration:  82 QT Interval:  386 QTC Calculation: 440 R Axis:   32  Text Interpretation: Normal sinus rhythm Low voltage QRS Borderline ECG When compared with ECG of 16-Feb-2020 13:45, No significant change was found Confirmed by Gypsy Balsam [id number]) on 08/17/2023 2:41:39 PM    Recent Labs: No results found for requested labs within last 365 days.  Recent Lipid Panel No results found for: "CHOL", "TRIG", "HDL", "CHOLHDL", "VLDL", "LDLCALC", "LDLDIRECT"  Physical Exam:    VS:  BP (!) 143/88 (BP Location: Left Arm, Patient Position: Sitting)   Pulse 78   Ht 5' 4.5" (1.638 m)   Wt 205 lb 6.4 oz (93.2 kg)   SpO2 96%   BMI 34.71 kg/m     Wt Readings from Last 3 Encounters:  08/17/23 205 lb 6.4 oz (93.2 kg)  08/04/22 233 lb (105.7 kg)  05/22/22 237 lb (107.5 kg)     GEN:  Well nourished, well developed in no acute distress HEENT: Normal NECK: No JVD; No carotid bruits LYMPHATICS: No lymphadenopathy CARDIAC: RRR, no murmurs, no rubs, no gallops RESPIRATORY:  Clear to auscultation without rales, wheezing or rhonchi  ABDOMEN: Soft, non-tender, non-distended MUSCULOSKELETAL:  No edema; No deformity  SKIN: Warm and dry LOWER EXTREMITIES: no swelling NEUROLOGIC:  Alert and oriented x 3 PSYCHIATRIC:  Normal affect   ASSESSMENT:    1. Essential hypertension   2. Sarcoidosis   3. Controlled type 2 diabetes mellitus without complication, without long-term current use of insulin (HCC)    PLAN:    In order of problems listed above:  Essential hypertension blood pressure well-controlled continue present management. Sarcoidosis stable followed by pulmonologist. Type 2 diabetes should be better with now with weight reduction after gastric bypass surgery, Pericardial fusion only small noted on the CT.  Will get echocardiogram to get a better assessment of the and the amount of fluid and significance of it obviously I doubt it would be something significant but  need to be followed. She noticed when she was having pain she was having also some bradycardia down to 48 but since that time everything looks stable.   Medication Adjustments/Labs and Tests Ordered: Current medicines are reviewed at length with the patient today.  Concerns regarding medicines are outlined above.  Orders Placed This Encounter  Procedures   EKG 12-Lead   Medication changes: No orders of the defined types were placed in this encounter.   Signed, Georgeanna Lea, MD, [facility] 08/17/2023 2:56 PM    [facility]

## 2023-08-18 ENCOUNTER — Other Ambulatory Visit: Payer: Self-pay

## 2023-08-18 MED ORDER — PANTOPRAZOLE SODIUM 40 MG PO TBEC: 40.0000 mg | DELAYED_RELEASE_TABLET | Freq: Every day | ORAL | 1 refills | Status: DC

## 2023-08-20 ENCOUNTER — Encounter: Payer: Self-pay | Admitting: Cardiology

## 2023-09-07 ENCOUNTER — Encounter: Payer: Self-pay | Admitting: Podiatry

## 2023-09-07 ENCOUNTER — Ambulatory Visit (INDEPENDENT_AMBULATORY_CARE_PROVIDER_SITE_OTHER): Payer: BC Managed Care – PPO | Admitting: Podiatry

## 2023-09-07 DIAGNOSIS — M7752 Other enthesopathy of left foot: Secondary | ICD-10-CM | POA: Diagnosis not present

## 2023-09-07 DIAGNOSIS — M722 Plantar fascial fibromatosis: Secondary | ICD-10-CM

## 2023-09-07 DIAGNOSIS — S86312D Strain of muscle(s) and tendon(s) of peroneal muscle group at lower leg level, left leg, subsequent encounter: Secondary | ICD-10-CM | POA: Diagnosis not present

## 2023-09-08 NOTE — Progress Notes (Signed)
She presents today for follow-up of her painful left foot.  She states that has been bothering her for quite some time all that she has tried has failed to alleviate her symptoms.  She states that is starting to affect her ability to perform her daily activities and to maintain her general good health.  Objective: I have reviewed her past medical history medications allergies surgery social history.  Pulses are palpable.  She has severe pain on palpation of the peroneus brevis tendon at the insertion on the fifth metatarsal base left she has what appears to be a hypertrophic bone overlying the fifth metatarsal base both dorsal lateral and plantarly.  Exquisitely tender on palpation.  There appears to be fluctuance around it which could be a bursitis.  She has tenderness on abduction against resistance.  Assessment: Possible peroneus brevis tear its insertion on the fifth metatarsal base.  Possible internal derangement of the bone as well as a inflamed bursa or bursitis.  Plan: At this point due to conservative therapies having failed including shoe gear changes steroid was nonsteroidals injections we are requesting an MRI of the left foot at the fifth metatarsal base.  This is for conservative, differential diagnoses as well as surgical treatment plans.

## 2023-09-16 DIAGNOSIS — Z7185 Encounter for immunization safety counseling: Secondary | ICD-10-CM | POA: Insufficient documentation

## 2023-09-16 HISTORY — DX: Encounter for immunization safety counseling: Z71.85

## 2023-09-27 ENCOUNTER — Ambulatory Visit (HOSPITAL_COMMUNITY): Payer: BC Managed Care – PPO | Attending: Cardiology

## 2023-09-27 ENCOUNTER — Ambulatory Visit: Payer: BC Managed Care – PPO

## 2023-09-27 DIAGNOSIS — R0609 Other forms of dyspnea: Secondary | ICD-10-CM | POA: Diagnosis not present

## 2023-09-27 LAB — ECHOCARDIOGRAM COMPLETE
Area-P 1/2: 3.91 cm2
S' Lateral: 2.8 cm

## 2023-09-28 ENCOUNTER — Encounter: Payer: Self-pay | Admitting: Cardiology

## 2023-09-28 ENCOUNTER — Telehealth: Payer: Self-pay

## 2023-09-28 DIAGNOSIS — I3139 Other pericardial effusion (noninflammatory): Secondary | ICD-10-CM

## 2023-09-28 NOTE — Telephone Encounter (Signed)
Echo Results reviewed with pt as per Dr. Anna Genre note.  Pt verbalized understanding and had no additional questions. Echo and follow appt scheduled. Routed to PCP

## 2023-09-29 ENCOUNTER — Ambulatory Visit
Admission: RE | Admit: 2023-09-29 | Discharge: 2023-09-29 | Disposition: A | Discharge status: Home / Self Care | Payer: BC Managed Care – PPO | Source: Ambulatory Visit | Attending: Podiatry | Admitting: Podiatry

## 2023-09-29 DIAGNOSIS — M722 Plantar fascial fibromatosis: Secondary | ICD-10-CM

## 2023-09-29 DIAGNOSIS — S86312D Strain of muscle(s) and tendon(s) of peroneal muscle group at lower leg level, left leg, subsequent encounter: Secondary | ICD-10-CM

## 2023-09-29 DIAGNOSIS — M7752 Other enthesopathy of left foot: Secondary | ICD-10-CM

## 2023-09-30 NOTE — Addendum Note (Signed)
Addended by: Baldo Ash D on: 09/30/2023 05:15 PM   Modules accepted: Orders

## 2023-10-01 ENCOUNTER — Encounter: Payer: Self-pay | Admitting: Cardiology

## 2023-10-04 ENCOUNTER — Telehealth: Payer: Self-pay | Admitting: Cardiology

## 2023-10-04 NOTE — Telephone Encounter (Signed)
Pt states no one has called her to schedule her ECHO from [facility]. Please advise

## 2023-10-04 NOTE — Telephone Encounter (Signed)
Spoke with pt regarding ECHO- She called [facility] and they had no received the order that we faxed over yet and would not schedule the ECHO. They told her they would call when they received the order. She stated that she would wait and if she does not hear from them today she will call back.

## 2023-10-07 ENCOUNTER — Encounter: Payer: Self-pay | Admitting: Podiatry

## 2023-10-07 DIAGNOSIS — I3139 Other pericardial effusion (noninflammatory): Secondary | ICD-10-CM | POA: Diagnosis not present

## 2023-10-08 ENCOUNTER — Other Ambulatory Visit (HOSPITAL_COMMUNITY): Payer: BC Managed Care – PPO

## 2023-10-14 ENCOUNTER — Encounter: Payer: Self-pay | Admitting: Cardiology

## 2023-10-25 ENCOUNTER — Encounter: Payer: Self-pay | Admitting: Podiatry

## 2023-11-09 ENCOUNTER — Other Ambulatory Visit: Payer: Self-pay

## 2023-11-09 DIAGNOSIS — R55 Syncope and collapse: Secondary | ICD-10-CM

## 2023-11-12 ENCOUNTER — Ambulatory Visit: Payer: BC Managed Care – PPO | Attending: Cardiology

## 2023-11-12 DIAGNOSIS — R55 Syncope and collapse: Secondary | ICD-10-CM

## 2023-11-22 DIAGNOSIS — R52 Pain, unspecified: Secondary | ICD-10-CM | POA: Insufficient documentation

## 2023-11-22 HISTORY — DX: Pain, unspecified: R52

## 2023-11-28 ENCOUNTER — Encounter: Payer: Self-pay | Admitting: Cardiology

## 2023-11-29 ENCOUNTER — Telehealth: Payer: Self-pay

## 2023-11-29 DIAGNOSIS — I3139 Other pericardial effusion (noninflammatory): Secondary | ICD-10-CM

## 2023-11-29 NOTE — Telephone Encounter (Signed)
Limited Echo for eval of Pericardial effusion only ordered per Dr. Bing Matter.

## 2023-11-30 ENCOUNTER — Ambulatory Visit: Payer: BC Managed Care – PPO | Admitting: Podiatry

## 2023-12-02 ENCOUNTER — Encounter: Payer: Self-pay | Admitting: Podiatry

## 2023-12-02 ENCOUNTER — Ambulatory Visit (INDEPENDENT_AMBULATORY_CARE_PROVIDER_SITE_OTHER): Payer: BC Managed Care – PPO | Admitting: Podiatry

## 2023-12-02 DIAGNOSIS — S86312D Strain of muscle(s) and tendon(s) of peroneal muscle group at lower leg level, left leg, subsequent encounter: Secondary | ICD-10-CM

## 2023-12-04 ENCOUNTER — Encounter: Payer: Self-pay | Admitting: Cardiology

## 2023-12-05 NOTE — Progress Notes (Signed)
Presents today for follow-up of her MRI states that she is recently been diagnosed with severe hypothyroidism.  States that her peroneal tendon seems to be worse  Objective: Still has pain on palpation peroneal tendons of the left lateral ankle.  Fluctuance is still present.  MRI report does demonstrate partial-thickness tear of the peroneus brevis at the level of the lateral malleolus.  And thickening of the medial band of the plantar fascia reflecting chronic intractable plantar fasciitis.  This is his optic changes consistent with heel spur.  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  Plan: She will follow-up with [place] once her T4 and TSH have improved and leveled off.  I will follow-up with her at that time for surgical consult peroneal tendon repair and endoscopic fasciotomy PRP repeat injection.

## 2023-12-09 ENCOUNTER — Ambulatory Visit: Payer: BC Managed Care – PPO | Attending: Cardiology

## 2023-12-09 DIAGNOSIS — I3139 Other pericardial effusion (noninflammatory): Secondary | ICD-10-CM | POA: Diagnosis not present

## 2023-12-09 LAB — ECHOCARDIOGRAM LIMITED
Area-P 1/2: 3.39 cm2
S' Lateral: 3.2 cm

## 2023-12-17 ENCOUNTER — Encounter: Payer: Self-pay | Admitting: Cardiology

## 2023-12-17 ENCOUNTER — Ambulatory Visit: Payer: BC Managed Care – PPO | Attending: Cardiology | Admitting: Cardiology

## 2023-12-17 VITALS — BP 135/75 | HR 58 | Ht 65.0 in | Wt 219.0 lb

## 2023-12-17 DIAGNOSIS — E039 Hypothyroidism, unspecified: Secondary | ICD-10-CM

## 2023-12-17 DIAGNOSIS — I10 Essential (primary) hypertension: Secondary | ICD-10-CM

## 2023-12-17 DIAGNOSIS — I3139 Other pericardial effusion (noninflammatory): Secondary | ICD-10-CM | POA: Diagnosis not present

## 2023-12-17 DIAGNOSIS — D869 Sarcoidosis, unspecified: Secondary | ICD-10-CM

## 2023-12-17 DIAGNOSIS — E119 Type 2 diabetes mellitus without complications: Secondary | ICD-10-CM

## 2023-12-17 HISTORY — DX: Hypothyroidism, unspecified: E03.9

## 2023-12-17 NOTE — Patient Instructions (Addendum)
 Medication Instructions:  Your physician recommends that you continue on your current medications as directed. Please refer to the Current Medication list given to you today.  *If you need a refill on your cardiac medications before your next appointment, please call your pharmacy*   Lab Work: None Ordered If you have labs (blood work) drawn today and your tests are completely normal, you will receive your results only by: MyChart Message (if you have MyChart) OR A paper copy in the mail If you have any lab test that is abnormal or we need to change your treatment, we will call you to review the results.   Testing/Procedures: Your physician has requested that you have an echocardiogram. Echocardiography is a painless test that uses sound waves to create images of your heart. It provides your doctor with information about the size and shape of your heart and how well your heart's chambers and valves are working. This procedure takes approximately one hour. There are no restrictions for this procedure. Please do NOT wear cologne, perfume, aftershave, or lotions (deodorant is allowed). Please arrive 15 minutes prior to your appointment time.  Please note: We ask at that you not bring children with you during ultrasound (echo/ vascular) testing. Due to room size and safety concerns, children are not allowed in the ultrasound rooms during exams. Our front office staff cannot provide observation of children in our lobby area while testing is being conducted. An adult accompanying a patient to their appointment will only be allowed in the ultrasound room at the discretion of the ultrasound technician under special circumstances. We apologize for any inconvenience.    Follow-Up: At [facility], you and your health needs are our priority.  As part of our continuing mission to provide you with exceptional heart care, we have created designated Provider Care Teams.  These Care Teams include your  primary Cardiologist (physician) and Advanced Practice Providers (APPs -  Physician Assistants and Nurse Practitioners) who all work together to provide you with the care you need, when you need it.  We recommend signing up for the patient portal called "MyChart".  Sign up information is provided on this After Visit Summary.  MyChart is used to connect with patients for Virtual Visits (Telemedicine).  Patients are able to view lab/test results, encounter notes, upcoming appointments, etc.  Non-urgent messages can be sent to your provider as well.   To learn more about what you can do with MyChart, go to [email or web address].    Your next appointment:   1 month(s)  The format for your next appointment:   In Person  Provider:   Gypsy Balsam, MD    Other Instructions NA

## 2023-12-17 NOTE — Progress Notes (Unsigned)
Virtual Visit via Video Note   Because of Norma Lopez's co-morbid illnesses, she is at least at moderate risk for complications without adequate follow up.  This format is felt to be most appropriate for this patient at this time.  All issues noted in this document were discussed and addressed.  A limited physical exam was performed with this format.  Please refer to the patient's chart for her consent to telehealth for [facility].     Evaluation Performed:  Follow-up visit  This visit type was conducted due to national recommendations for restrictions regarding the COVID-19 Pandemic (e.g. social distancing).  This format is felt to be most appropriate for this patient at this time.  All issues noted in this document were discussed and addressed.  No physical exam was performed (except for noted visual exam findings with Video Visits).  Please refer to the patient's chart (MyChart message for video visits and phone note for telephone visits) for the patient's consent to telehealth for [facility].  Date:  12/17/2023  ID: Norma Lopez, DOB [birth year], MRN [id number]   Patient Location: [address] [facility] [place] [address]   Provider location:   [facility] [place] [facility]  PCP:  Raynelle Jan., MD  Cardiologist:  Gypsy Balsam, MD     Chief Complaint: I am having diarrhea today  History of Present Illness:    Norma Lopez is a 62 y.o. female  who presents via audio/video conferencing for a telehealth visit today.  Past medical history significant for sarcoidosis with pulmonary involvement, palpitation from of PVCs suppressed with beta-blocker, essential hypertension, diabetes, recently she had CT of the chest done she was found to have small pericardial effusion, after that echocardiogram has been performed which revealed again presence of small to moderate pericardial effusion she has been follow-up on that.  Recent echocardiogram shows similar amount of  fluid however there was some future suspicion for tamponade.  I am talking to her today on the video conference.  She take care of her grandchildren they got sick with a stomach bug and she also have this problem and she decided not to come to the office to be doing virtual visit.  She was find to have significant hypothyroidism.  Synthroid has been initiated she started feeling better.  Previously she described fatigue tiredness weakness also some shortness of breath puffiness on the face puffiness of the hands as well as legs.  That is slightly getting better but she initiated treatment a few weeks ago and is very optimistic hoping that that will improve the situation.   The patient does not have symptoms concerning for COVID-19 infection (fever, chills, cough, or new SHORTNESS OF BREATH).    Prior CV studies:   The following studies were reviewed today:  Echocardiogram reviewed showing some high variability on transmitral transtricuspid flow amount of pericardial effusion is mild to moderate probably thickest level is 1.8 which is similar to echo before.     Past Medical History:  Diagnosis Date   ADD (attention deficit disorder with hyperactivity)    Adnexal mass 10/15/2015   Age-related nuclear cataract of both eyes 01/13/2016   Allergic rhinitis    Anxiety and depression    Arthropathy of cervical facet joint 01/30/2019   Formatting of this note might be different from the original. Added automatically from request for surgery [id number]   Asthma    Atypical chest pain 12/04/2020   Bilateral carpal tunnel syndrome 01/13/2016  Chronic dryness of both eyes 01/13/2016   Chronic obstructive pulmonary disease (HCC) 02/03/2021   Cognitive complaints with normal neuropsychological exam 01/13/2016   Complete rupture of left proximal hamstring tendon 12/02/2020   Formatting of this note might be different from the original. Added automatically from request for surgery [id number]   Conjunctival  concretions of both eyes 01/13/2016   Conjunctival hemorrhage of right eye 06/17/2016   COPD (chronic obstructive pulmonary disease) (HCC)    Cough 04/26/2008   Qualifier: Diagnosis of  By: Clent Ridges NP, Tammy     Daytime somnolence 01/13/2016   Diabetes mellitus type 2, controlled (HCC) 01/13/2016   Diarrhea, functional    Diverticulosis    Dysphagia 07/27/2016   Dyspnea on exertion 01/13/2016   Dysrhythmia 2015   none since 2015   Edema 01/13/2016   Elevated liver enzymes 11/02/2017   Episodic tension-type headache, not intractable 03/10/2018   Essential hypertension 10/15/2015   Family history of colon cancer    Fibrosis of lung (HCC)    Gait disorder 07/30/2016   Generalized anxiety disorder 12/28/1988   GERD (gastroesophageal reflux disease)    History of pneumoconiosis    with ILD with nocturnal hypoxemia on 02 2 l/min   History of varicose veins 01/13/2016   Hyperlipidemia    HYPERLIPIDEMIA 04/20/2008   Qualifier: Diagnosis of  By: Delford Field MD, Charlcie Cradle    Hypoxemia 01/13/2016   IBS (irritable bowel syndrome)    Immunocompromised state (HCC) 08/18/2017   Joint pain, knee 01/13/2016   Left groin hernia 01/13/2016   Leukopenia 11/15/2018   Low back pain without sciatica 07/30/2016   Lumbar herniated disc    Migraines    Multiple lung nodules on CT    Neck pain 07/30/2016   Neuromuscular disorder (HCC)    neuropathy feet   Nocturnal hypoxemia 04/20/2008   patient reports using CPAP machine at night Formatting of this note might be different from the original. Overview:  Qualifier: Diagnosis of  By: Maple Hudson MD, Clinton D  Formatting of this note might be different from the original. Overview:  Qualifier: Diagnosis of  By: Delford Field MD, Cline Cools of this note might be different from the original. 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 Overview:  Qualifier: Diagnosis of  By: Delford Field MD, Charlcie Cradle   PONV (postoperative nausea and vomiting)    Precordial chest pain 08/26/2018   Premature ventricular contraction 02/02/2011   none since 2015 Formatting of this note might be different from the original. Overview:  Qualifier: Diagnosis of  By: Via LPN, Ardis Rowan 01/13/2016   Presbyopia 06/17/2016   PTSD (post-traumatic stress disorder)    Pulmonary fibrosis, unspecified (HCC) 01/13/2016   Rectal incontinence    Restless leg syndrome    S/P left knee arthroscopy 03/05/2020   Sarcoidosis    Severe episode of recurrent major depressive disorder, without psychotic features (HCC) 01/13/2016   Situational anxiety 01/15/2016   Sleep apnea    patient reports using CPAP machine at night   Spondylosis without myelopathy or radiculopathy, thoracic  region 01/30/2019   Formatting of this note might be different from the original. Added automatically from request for surgery [id number]  Formatting of this note might be different from the original. Added automatically from request for surgery [id number] Formatting of this note might be different from the original. 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 Pancolonic diverticulosis predominantly in the sigmoid colon'   COLONOSCOPY WITH PROPOFOL N/A 08/06/2022   Procedure: COLONOSCOPY WITH PROPOFOL;  Surgeon: Lynann Bologna, MD;  Location: [facility];  Service: Gastroenterology;  Laterality: N/A;   ESOPHAGOGASTRODUODENOSCOPY  07/25/2008   Healed esophageal erosions. Irregular Z-line suggestive of gastroesophageal reflux (biopsied to rule out short-segment Barrett's esophagus) Mild gastritis.   ESOPHAGOGASTRODUODENOSCOPY (EGD) WITH PROPOFOL N/A 08/06/2022   Procedure: ESOPHAGOGASTRODUODENOSCOPY (EGD) WITH PROPOFOL;  Surgeon: Lynann Bologna, MD;  Location: [facility];  Service: Gastroenterology;  Laterality: N/A;   HAMSTRING SURGERY  11/2020   ANCHOR BOLT    KNEE ARTHROSCOPY     x3   KNEE ARTHROSCOPY WITH MEDIAL MENISECTOMY Left 02/26/2020   Procedure: KNEE ARTHROSCOPY WITH MEDIAL MENISECTOMY;  Surgeon: Dannielle Huh, MD;  Location: [facility];  Service: Orthopedics;  Laterality: Left;   MALONEY DILATION  08/06/2022   Procedure: MALONEY DILATION;  Surgeon: Lynann Bologna, MD;  Location: [facility];  Service: Gastroenterology;;   POLYPECTOMY  08/06/2022   Procedure: POLYPECTOMY;  Surgeon: Lynann Bologna, MD;  Location: [facility];  Service: Gastroenterology;;   TONSILLECTOMY     TOTAL ABDOMINAL HYSTERECTOMY  1992   TUBAL LIGATION     veins stripped  Left 1989     Current Meds  Medication Sig   acetaminophen (TYLENOL) 500 MG tablet Take 500-1,000 mg by mouth every 6 (six) hours as needed for moderate pain or mild pain.   Adalimumab 40 MG/0.4ML PSKT Inject 40 mg into the skin once a week. Patient reports once weekly injections   albuterol (VENTOLIN HFA) 108 (90 Base) MCG/ACT inhaler Inhale 2 puffs into the lungs every 6 (six) hours as needed for wheezing or shortness of breath.   Biotin 10 MG TABS Take 10 mg by mouth daily in the afternoon.   Cholecalciferol (VITAMIN D-3 PO) Take 2,000 Units by mouth daily.   Coenzyme Q10 100 MG capsule  Take 100 mg by mouth daily.   estradiol (CLIMARA - DOSED IN MG/24 HR) 0.075 mg/24hr patch Place 1 patch (0.075 mg total) onto the skin once a week. Please specify directions, refills and quantity   Fluticasone Furoate 100 MCG/ACT AEPB Inhale 1 puff into the lungs at bedtime. ARNUITY ELLIPTA   levothyroxine (SYNTHROID) 75 MCG tablet Take 75 mcg by mouth daily before breakfast.   lisinopril (ZESTRIL) 2.5 MG tablet Take 2.5 mg by mouth at bedtime.   Magnesium Oxide 250 MG TABS Take 250 mg by mouth at bedtime.   Multiple Vitamins-Minerals (BARIATRIC MULTIVITAMINS/IRON PO) Take 1 tablet by mouth daily in the afternoon.   Omega-3 Fatty Acids (FISH OIL ULTRA) 1400 MG CAPS Take 2,000  mg by mouth 2 (two) times daily.   ONETOUCH ULTRA test strip 1 each by Other route as needed for other (Glucose).   OXYGEN Inhale 1 L into the lungs at bedtime.   pantoprazole (PROTONIX) 40 MG tablet Take 1 tablet (40 mg total) by mouth daily.   pramipexole (MIRAPEX) 0.5 MG tablet Take 0.5 mg by mouth at bedtime.   rosuvastatin (CRESTOR) 10 MG tablet Take 10 mg by mouth at bedtime.   [DISCONTINUED] lamoTRIgine (LAMICTAL) 200 MG tablet Take 200 mg by mouth daily.      Family History: The patient's family history includes Alcohol abuse in her father; Allergies in an other family member; Anxiety disorder in her mother and another family member; Arthritis in her mother and another family member; Asthma in her sister and sister; Breast cancer (age of onset: 58) in her sister; COPD in her father and mother; Cancer in her father; Cancer (age of onset: 34) in her sister; Depression in her mother; Diabetes in her father and sister; Heart attack in her father; Heart disease in her father; Hyperlipidemia in an other family member; Obesity in an other family member; Sleep apnea in her sister.   ROS:   Please see the history of present illness.     All other systems reviewed and are negative.   Labs/Other Tests and Data Reviewed:      Recent Labs: No results found for requested labs within last 365 days.  Recent Lipid Panel No results found for: "CHOL", "TRIG", "HDL", "CHOLHDL", "VLDL", "LDLCALC", "LDLDIRECT"    Exam:    Vital Signs:  BP 135/75   Pulse (!) 58   Ht 5\' 5"  (1.651 m)   Wt 219 lb (99.3 kg)   SpO2 98%   BMI 36.44 kg/m     Wt Readings from Last 3 Encounters:  12/17/23 219 lb (99.3 kg)  08/17/23 205 lb 6.4 oz (93.2 kg)  08/04/22 233 lb (105.7 kg)     Well nourished, well developed in no acute distress. Doing well talking to me over the video link.  Not in any distress.  She does have diarrhea however   Diagnosis for this visit:   1. Acquired hypothyroidism   2. Pericardial effusion   3. Sarcoidosis   4. Controlled type 2 diabetes mellitus without complication, without long-term current use of insulin (HCC)   5. Essential hypertension      ASSESSMENT & PLAN:    1.  Pericardial effusion which is obviously concerning.  Some concerning features on the echocardiogram but overall clinically she seems to be doing well.  Recently she was diagnosed with severe hypothyroidism which I think is contributing to her pericardial fusion.  My intention was to consider even left and right cardiac catheterization with potential top of the pericardial effusion trying to determine etiology of this phenomenon as well as give her hemodynamic relief however when I find out that she does have severe hypothyroidism and make me think that we can wait cautiously hopefully with proper correction of her thyroid pericardial fusion will go away.  I warned her about signs and symptoms of tamponade and ask her to let me know if she develops any.  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  Will continue following.  She will require an echocardiogram in the middle of January. 2.  Sarcoidosis follow-up by primary care physicians and pulmonologist. 3.  Diabetes stable. Essential hypertension stable.  COVID-19  Education: The signs and symptoms of COVID-19  were discussed with the patient and how to seek care for testing (follow up with PCP or arrange E-visit).  The importance of social distancing was discussed today.  Patient Risk:   After full review of this patients clinical status, I feel that they are at least moderate risk at this time.  Time:   Today, I have spent 15 minutes with the patient with telehealth technology discussing pt health issues.  I spent reviewing her chart before the visit.  Visit was finished at 10:14 .    Medication Adjustments/Labs and Tests Ordered: Current medicines are reviewed at length with the patient today.  Concerns regarding medicines are outlined above.  No orders of the defined types were placed in this encounter.  Medication changes: No orders of the defined types were placed in this encounter.    Disposition:  fup  Signed, Georgeanna Lea, MD, [facility] 12/17/2023 10:15 AM    [facility]

## 2023-12-28 ENCOUNTER — Telehealth: Payer: Self-pay

## 2023-12-28 NOTE — Telephone Encounter (Signed)
 Left message on My Chart with Echo results per Dr. Vanetta Shawl note. Routed to PCP.

## 2023-12-30 ENCOUNTER — Telehealth: Payer: Self-pay

## 2023-12-30 NOTE — Telephone Encounter (Signed)
Pt viewed results on My Chart per Dr. Krasowski's note. Routed to PCP.  

## 2024-01-14 ENCOUNTER — Ambulatory Visit: Payer: BC Managed Care – PPO | Attending: Cardiology

## 2024-01-14 DIAGNOSIS — I3139 Other pericardial effusion (noninflammatory): Secondary | ICD-10-CM

## 2024-01-14 LAB — ECHOCARDIOGRAM COMPLETE
Area-P 1/2: 3.17 cm2
S' Lateral: 3 cm

## 2024-01-21 ENCOUNTER — Ambulatory Visit: Payer: BC Managed Care – PPO | Attending: Cardiology | Admitting: Cardiology

## 2024-01-21 ENCOUNTER — Encounter: Payer: Self-pay | Admitting: Cardiology

## 2024-01-21 VITALS — BP 144/80 | HR 73 | Ht 65.0 in | Wt 228.8 lb

## 2024-01-21 DIAGNOSIS — E119 Type 2 diabetes mellitus without complications: Secondary | ICD-10-CM | POA: Diagnosis not present

## 2024-01-21 DIAGNOSIS — I3139 Other pericardial effusion (noninflammatory): Secondary | ICD-10-CM | POA: Diagnosis not present

## 2024-01-21 DIAGNOSIS — I10 Essential (primary) hypertension: Secondary | ICD-10-CM | POA: Diagnosis not present

## 2024-01-21 NOTE — Progress Notes (Signed)
Cardiology Office Note:    Date:  01/21/2024   ID:  Norma Lopez, DOB 16-Feb-1961, MRN [id number]  PCP:  Raynelle Jan., MD  Cardiologist:  Gypsy Balsam, MD    Referring MD: Raynelle Jan., MD   Chief Complaint  Patient presents with   Follow-up    History of Present Illness:    Norma Lopez is a 63 y.o. female past medical history significant for pulmonary involvement of sarcoidosis, palpitations, PVCs suppressed with beta-blocker, essential hypertension, diabetes, pericardial effusion, hypothyroidism.  Type recall effusion small to moderate without evidence of tamponade.  She comes today to discuss results of her echocardiogram which showed same amount of fluid.  Sadly she still got hypothyroidism which is not well-controlled her TSH is still elevated.  She gained significant amount of weight she complained of having weakness fatigue and tiredness.  Denies having any atypical chest pain tightness squeezing pressure burning chest sometimes she felt pinches in her chest.  Not related to exercise  Past Medical History:  Diagnosis Date   ADD (attention deficit disorder with hyperactivity)    Adnexal mass 10/15/2015   Age-related nuclear cataract of both eyes 01/13/2016   Allergic rhinitis    Anxiety and depression    Arthropathy of cervical facet joint 01/30/2019   Formatting of this note might be different from the original. Added automatically from request for surgery [id number]   Asthma    Atypical chest pain 12/04/2020   Bilateral carpal tunnel syndrome 01/13/2016   Chronic dryness of both eyes 01/13/2016   Chronic obstructive pulmonary disease (HCC) 02/03/2021   Cognitive complaints with normal neuropsychological exam 01/13/2016   Complete rupture of left proximal hamstring tendon 12/02/2020   Formatting of this note might be different from the original. Added automatically from request for surgery [id number]   Conjunctival concretions of both eyes 01/13/2016   Conjunctival  hemorrhage of right eye 06/17/2016   COPD (chronic obstructive pulmonary disease) (HCC)    Cough 04/26/2008   Qualifier: Diagnosis of  By: Clent Ridges NP, Tammy     Daytime somnolence 01/13/2016   Diabetes mellitus type 2, controlled (HCC) 01/13/2016   Diarrhea, functional    Diverticulosis    Dysphagia 07/27/2016   Dyspnea on exertion 01/13/2016   Dysrhythmia 2015   none since 2015   Edema 01/13/2016   Elevated liver enzymes 11/02/2017   Episodic tension-type headache, not intractable 03/10/2018   Essential hypertension 10/15/2015   Family history of colon cancer    Fibrosis of lung (HCC)    Gait disorder 07/30/2016   Generalized anxiety disorder 12/28/1988   GERD (gastroesophageal reflux disease)    History of pneumoconiosis    with ILD with nocturnal hypoxemia on 02 2 l/min   History of varicose veins 01/13/2016   Hyperlipidemia    HYPERLIPIDEMIA 04/20/2008   Qualifier: Diagnosis of  By: Delford Field MD, Charlcie Cradle    Hypoxemia 01/13/2016   IBS (irritable bowel syndrome)    Immunocompromised state (HCC) 08/18/2017   Joint pain, knee 01/13/2016   Left groin hernia 01/13/2016   Leukopenia 11/15/2018   Low back pain without sciatica 07/30/2016   Lumbar herniated disc    Migraines    Multiple lung nodules on CT    Neck pain 07/30/2016   Neuromuscular disorder (HCC)    neuropathy feet   Nocturnal hypoxemia 04/20/2008   patient reports using CPAP machine at night Formatting of this note might be different from the original. Overview:  Qualifier: Diagnosis of  By:  Young MD, Clinton D  Formatting of this note might be different from the original. Overview:  Qualifier: Diagnosis of  By: Delford Field MD, Cline Cools of this note might be different from the original. 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 Overview:  Qualifier: Diagnosis of  By: Delford Field MD, Charlcie Cradle   PONV (postoperative nausea and vomiting)    Precordial chest pain 08/26/2018   Premature ventricular contraction 02/02/2011   none since 2015 Formatting of this note might be different from the original. Overview:  Qualifier: Diagnosis of  By: Via LPN, Ardis Rowan 01/13/2016   Presbyopia 06/17/2016   PTSD (post-traumatic stress disorder)    Pulmonary fibrosis, unspecified (HCC) 01/13/2016   Rectal incontinence    Restless leg syndrome    S/P left knee arthroscopy 03/05/2020   Sarcoidosis    Severe episode of recurrent major depressive disorder, without psychotic features (HCC) 01/13/2016   Situational anxiety 01/15/2016   Sleep apnea    patient reports using CPAP machine at night   Spondylosis without myelopathy or radiculopathy, thoracic region 01/30/2019   Formatting of this note might be different from the original. Added automatically from request for surgery [id number]  Formatting of this note might be different from the original. Added automatically from request for surgery [id number] Formatting of this note might be different from the original. Added automatically from request for surgery [id number]   Steroid-induced diabetes (HCC) 07/01/2016   Symptomatic menopausal or female climacteric states 01/13/2016   Thoracic back sprain 11/04/2017   Thyroid nodule    Vitamin D deficiency 01/13/2016    Past Surgical History:  Procedure Laterality Date   BIOPSY  08/06/2022   Procedure: BIOPSY;  Surgeon: Lynann Bologna, MD;  Location: [facility];  Service: Gastroenterology;;   BREAST EXCISIONAL BIOPSY Left 1985, 2006   Benign   CHOLECYSTECTOMY     COLONOSCOPY  06/23/2017   Colonic polyp status post polypectomy. Pancolonic diverticulosis  predominantly in the sigmoid colon'   COLONOSCOPY WITH PROPOFOL N/A 08/06/2022   Procedure: COLONOSCOPY WITH PROPOFOL;  Surgeon: Lynann Bologna, MD;  Location: [facility];  Service: Gastroenterology;  Laterality: N/A;   ESOPHAGOGASTRODUODENOSCOPY  07/25/2008   Healed esophageal erosions. Irregular Z-line suggestive of gastroesophageal reflux (biopsied to rule out short-segment Barrett's esophagus) Mild gastritis.   ESOPHAGOGASTRODUODENOSCOPY (EGD) WITH PROPOFOL N/A 08/06/2022   Procedure: ESOPHAGOGASTRODUODENOSCOPY (EGD) WITH PROPOFOL;  Surgeon: Lynann Bologna, MD;  Location: [facility];  Service: Gastroenterology;  Laterality: N/A;   HAMSTRING SURGERY  11/2020   ANCHOR BOLT    KNEE ARTHROSCOPY     x3   KNEE ARTHROSCOPY WITH MEDIAL MENISECTOMY Left 02/26/2020   Procedure: KNEE ARTHROSCOPY WITH MEDIAL MENISECTOMY;  Surgeon: Dannielle Huh, MD;  Location: [facility];  Service: Orthopedics;  Laterality: Left;   MALONEY DILATION  08/06/2022   Procedure: Elease Hashimoto DILATION;  Surgeon: Lynann Bologna, MD;  Location: [facility];  Service: Gastroenterology;;   POLYPECTOMY  08/06/2022   Procedure: POLYPECTOMY;  Surgeon: Lynann Bologna, MD;  Location: [facility];  Service: Gastroenterology;;   TONSILLECTOMY  TOTAL ABDOMINAL HYSTERECTOMY  1992   TUBAL LIGATION     veins stripped  Left 1989    Current Medications: Current Meds  Medication Sig   acetaminophen (TYLENOL) 500 MG tablet Take 500-1,000 mg by mouth every 6 (six) hours as needed for moderate pain or mild pain.   Adalimumab 40 MG/0.4ML PSKT Inject 40 mg into the skin once a week. Patient reports once weekly injections   albuterol (VENTOLIN HFA) 108 (90 Base) MCG/ACT inhaler Inhale 2 puffs into the lungs every 6 (six) hours as needed for wheezing or shortness of breath.   Biotin 10 MG TABS Take 10 mg by mouth daily in the afternoon.   Cholecalciferol (VITAMIN D-3 PO) Take 2,000 Units by mouth daily.   Coenzyme Q10 100 MG capsule Take 100 mg by  mouth daily.   estradiol (CLIMARA - DOSED IN MG/24 HR) 0.075 mg/24hr patch Place 1 patch (0.075 mg total) onto the skin once a week. Please specify directions, refills and quantity   Fluticasone Furoate 100 MCG/ACT AEPB Inhale 1 puff into the lungs at bedtime. ARNUITY ELLIPTA   levothyroxine (SYNTHROID) 88 MCG tablet Take 88 mcg by mouth daily before breakfast.   lisinopril (ZESTRIL) 2.5 MG tablet Take 2.5 mg by mouth at bedtime.   Magnesium Oxide 250 MG TABS Take 250 mg by mouth at bedtime.   Multiple Vitamins-Minerals (BARIATRIC MULTIVITAMINS/IRON PO) Take 1 tablet by mouth daily in the afternoon.   Omega-3 Fatty Acids (FISH OIL ULTRA) 1400 MG CAPS Take 2,000 mg by mouth 2 (two) times daily.   ONETOUCH ULTRA test strip 1 each by Other route as needed for other (Glucose).   OXYGEN Inhale 1 L into the lungs at bedtime.   pantoprazole (PROTONIX) 40 MG tablet Take 1 tablet (40 mg total) by mouth daily.   pramipexole (MIRAPEX) 0.5 MG tablet Take 0.5 mg by mouth at bedtime.   rosuvastatin (CRESTOR) 10 MG tablet Take 10 mg by mouth at bedtime.     Allergies:   Latex, Nickel, Infliximab, Lamotrigine, Pregabalin, Azithromycin, and Wellbutrin [bupropion]   Social History   Socioeconomic History   Marital status: Married    Spouse name: Not on file   Number of children: Not on file   Years of education: Not on file   Highest education level: Not on file  Occupational History    Employer: [employer]   Occupation: RN  Tobacco Use   Smoking status: Former    Current packs/day: 0.00    Types: Cigarettes    Start date: 12/28/1990    Quit date: 12/28/1996    Years since quitting: 27.0   Smokeless tobacco: Never   Tobacco comments:    Exposed to second hand smoke  Vaping Use   Vaping status: Never Used  Substance and Sexual Activity   Alcohol use: No   Drug use: No   Sexual activity: Yes    Partners: Male    Birth control/protection: Surgical    Comment: 1st intercourse- 14, partners- 5,  hysterectomy  Other Topics Concern   Not on file  Social History Narrative   Married with 3 children   3-4 cups caffeine per day   Exercises 2-3 times per week   Social Drivers of Health   Financial Resource Strain: Low Risk  (12/04/2021)   Received from [facility] prior to 02/27/2023., [facility] visits prior to 02/27/2023.   Overall Financial Resource Strain (CARDIA)    Difficulty of Paying  Living Expenses: Not hard at all  Food Insecurity: Low Risk  (09/04/2023)   [facility]   Hunger Vital Sign    Worried About Running Out of Food in the Last Year: Never true    Ran Out of Food in the Last Year: Never true  Transportation Needs: Unmet Transportation Needs (09/04/2023)   Received from [facility]    In the past 12 months, has lack of reliable transportation kept you from medical appointments, meetings, work or from getting things needed for daily living? : Yes  Physical Activity: Insufficiently Active (12/04/2021)   Received from [facility] visits prior to 02/27/2023., [facility] visits prior to 02/27/2023.   Exercise Vital Sign    Days of Exercise per Week: 2 days    Minutes of Exercise per Session: 10 min  Stress: Stress Concern Present (12/04/2021)   [facility] visits prior to 02/27/2023., [facility] visits prior to 02/27/2023.   [employer] of Occupational Health - Occupational Stress Questionnaire    Feeling of Stress : To some extent  Social Connections: Socially Integrated (12/04/2021)   Received from [facility] visits prior to 02/27/2023., [facility] visits prior to 02/27/2023.   Social Advertising account executive [NHANES]    Frequency of Communication with Friends and Family: More than three times a week    Frequency of Social Gatherings with  Friends and Family: Twice a week    Attends Religious Services: 1 to 4 times per year    Active Member of [employer] or Organizations: Yes    Attends Banker Meetings: 1 to 4 times per year    Marital Status: Married     Family History: The patient's family history includes Alcohol abuse in her father; Allergies in an other family member; Anxiety disorder in her mother and another family member; Arthritis in her mother and another family member; Asthma in her sister and sister; Breast cancer (age of onset: 34) in her sister; COPD in her father and mother; Cancer in her father; Cancer (age of onset: 68) in her sister; Depression in her mother; Diabetes in her father and sister; Heart attack in her father; Heart disease in her father; Hyperlipidemia in an other family member; Obesity in an other family member; Sleep apnea in her sister. ROS:   Please see the history of present illness.    All 14 point review of systems negative except as described per history of present illness  EKGs/Labs/Other Studies Reviewed:         Recent Labs: No results found for requested labs within last 365 days.  Recent Lipid Panel No results found for: "CHOL", "TRIG", "HDL", "CHOLHDL", "VLDL", "LDLCALC", "LDLDIRECT"  Physical Exam:    VS:  BP (!) 144/80 (BP Location: Right Arm, Patient Position: Sitting)   Pulse 73   Ht 5\' 5"  (1.651 m)   Wt 228 lb 12.8 oz (103.8 kg)   SpO2 96%   BMI 38.07 kg/m     Wt Readings from Last 3 Encounters:  01/21/24 228 lb 12.8 oz (103.8 kg)  12/17/23 219 lb (99.3 kg)  08/17/23 205 lb 6.4 oz (93.2 kg)     GEN:  Well nourished, well developed in no acute distress HEENT: Normal NECK: No JVD; No carotid bruits LYMPHATICS: No lymphadenopathy CARDIAC: RRR, no murmurs, no rubs, no gallops RESPIRATORY:  Clear  to auscultation without rales, wheezing or rhonchi  ABDOMEN: Soft, non-tender, non-distended MUSCULOSKELETAL:  No edema; No deformity  SKIN: Warm and  dry LOWER EXTREMITIES: no swelling NEUROLOGIC:  Alert and oriented x 3 PSYCHIATRIC:  Normal affect   ASSESSMENT:    1. Pericardial effusion   2. Essential hypertension   3. Controlled type 2 diabetes mellitus without complication, without long-term current use of insulin (HCC)    PLAN:    In order of problems listed above:  Pericardial fusion which is moderate no evidence of tamponade goal is to control her thyroid if thyroid will be well-controlled I hope that the pericardial vision will disappear if not she may require pericardiocentesis I suspect pericardial effusion is related to her hypothyroidism.  She does not have any evidence of cardiac tamponade. Essential hypertension slightly on the high side we will continue monitoring. Diabetes that being followed by internal medicine team. Sarcoidosis followed by rheumatologist   Medication Adjustments/Labs and Tests Ordered: Current medicines are reviewed at length with the patient today.  Concerns regarding medicines are outlined above.  No orders of the defined types were placed in this encounter.  Medication changes: No orders of the defined types were placed in this encounter.   Signed, Georgeanna Lea, MD, [facility] 01/21/2024 1:09 PM    [facility]

## 2024-01-21 NOTE — Patient Instructions (Addendum)
Medication Instructions:  Your physician recommends that you continue on your current medications as directed. Please refer to the Current Medication list given to you today.  *If you need a refill on your cardiac medications before your next appointment, please call your pharmacy*   Lab Work: None Ordered If you have labs (blood work) drawn today and your tests are completely normal, you will receive your results only by: MyChart Message (if you have MyChart) OR A paper copy in the mail If you have any lab test that is abnormal or we need to change your treatment, we will call you to review the results.   Testing/Procedures: Your physician has requested that you have an echocardiogram. Echocardiography is a painless test that uses sound waves to create images of your heart. It provides your doctor with information about the size and shape of your heart and how well your heart's chambers and valves are working. This procedure takes approximately one hour. There are no restrictions for this procedure. Please do NOT wear cologne, perfume, aftershave, or lotions (deodorant is allowed). Please arrive 15 minutes prior to your appointment time.  Please note: We ask at that you not bring children with you during ultrasound (echo/ vascular) testing. Due to room size and safety concerns, children are not allowed in the ultrasound rooms during exams. Our front office staff cannot provide observation of children in our lobby area while testing is being conducted. An adult accompanying a patient to their appointment will only be allowed in the ultrasound room at the discretion of the ultrasound technician under special circumstances. We apologize for any inconvenience.    Follow-Up: At [facility], you and your health needs are our priority.  As part of our continuing mission to provide you with exceptional heart care, we have created designated Provider Care Teams.  These Care Teams include your  primary Cardiologist (physician) and Advanced Practice Providers (APPs -  Physician Assistants and Nurse Practitioners) who all work together to provide you with the care you need, when you need it.  We recommend signing up for the patient portal called "MyChart".  Sign up information is provided on this After Visit Summary.  MyChart is used to connect with patients for Virtual Visits (Telemedicine).  Patients are able to view lab/test results, encounter notes, upcoming appointments, etc.  Non-urgent messages can be sent to your provider as well.   To learn more about what you can do with MyChart, go to [email or web address].    Your next appointment:   4 month(s)  The format for your next appointment:   In Person  Provider:   Gypsy Balsam, MD    Other Instructions NA

## 2024-01-21 NOTE — Addendum Note (Signed)
Addended by: Baldo Ash D on: 01/21/2024 01:21 PM   Modules accepted: Orders

## 2024-02-12 ENCOUNTER — Other Ambulatory Visit: Payer: Self-pay | Admitting: Gastroenterology

## 2024-02-22 ENCOUNTER — Ambulatory Visit: Payer: BC Managed Care – PPO | Attending: Cardiology

## 2024-02-22 DIAGNOSIS — I3139 Other pericardial effusion (noninflammatory): Secondary | ICD-10-CM

## 2024-02-22 LAB — ECHOCARDIOGRAM LIMITED
Area-P 1/2: 3.62 cm2
S' Lateral: 3.2 cm

## 2024-02-25 ENCOUNTER — Telehealth: Payer: Self-pay

## 2024-02-25 NOTE — Telephone Encounter (Signed)
 Left message on My Chart with normal results per Dr. Hulen Shouts note. Routed to PCP.

## 2024-02-28 ENCOUNTER — Telehealth: Payer: Self-pay

## 2024-02-28 NOTE — Telephone Encounter (Signed)
 Echo Results reviewed with pt as per Dr. Hulen Shouts note.  Pt verbalized understanding and had no additional questions. Routed to PCP

## 2024-05-12 ENCOUNTER — Other Ambulatory Visit: Payer: Self-pay | Admitting: Gastroenterology

## 2024-05-18 ENCOUNTER — Other Ambulatory Visit: Payer: Self-pay

## 2024-05-24 ENCOUNTER — Ambulatory Visit: Attending: Cardiology | Admitting: Cardiology

## 2024-05-24 ENCOUNTER — Encounter: Payer: Self-pay | Admitting: Cardiology

## 2024-05-24 VITALS — BP 110/70 | HR 70 | Ht 64.0 in | Wt 216.0 lb

## 2024-05-24 DIAGNOSIS — I10 Essential (primary) hypertension: Secondary | ICD-10-CM | POA: Diagnosis not present

## 2024-05-24 DIAGNOSIS — D869 Sarcoidosis, unspecified: Secondary | ICD-10-CM | POA: Diagnosis not present

## 2024-05-24 DIAGNOSIS — I3139 Other pericardial effusion (noninflammatory): Secondary | ICD-10-CM | POA: Diagnosis not present

## 2024-05-24 NOTE — Patient Instructions (Signed)
 Medication Instructions:  Your physician recommends that you continue on your current medications as directed. Please refer to the Current Medication list given to you today.  *If you need a refill on your cardiac medications before your next appointment, please call your pharmacy*  Lab Work: None If you have labs (blood work) drawn today and your tests are completely normal, you will receive your results only by: MyChart Message (if you have MyChart) OR A paper copy in the mail If you have any lab test that is abnormal or we need to change your treatment, we will call you to review the results.  Testing/Procedures: Your physician has requested that you have an echocardiogram. Echocardiography is a painless test that uses sound waves to create images of your heart. It provides your doctor with information about the size and shape of your heart and how well your heart's chambers and valves are working. This procedure takes approximately one hour. There are no restrictions for this procedure. Please do NOT wear cologne, perfume, aftershave, or lotions (deodorant is allowed). Please arrive 15 minutes prior to your appointment time.  Please note: We ask at that you not bring children with you during ultrasound (echo/ vascular) testing. Due to room size and safety concerns, children are not allowed in the ultrasound rooms during exams. Our front office staff cannot provide observation of children in our lobby area while testing is being conducted. An adult accompanying a patient to their appointment will only be allowed in the ultrasound room at the discretion of the ultrasound technician under special circumstances. We apologize for any inconvenience.   Follow-Up: At [facility], you and your health needs are our priority.  As part of our continuing mission to provide you with exceptional heart care, our providers are all part of one team.  This team includes your primary Cardiologist  (physician) and Advanced Practice Providers or APPs (Physician Assistants and Nurse Practitioners) who all work together to provide you with the care you need, when you need it.  Your next appointment:   6 month(s)  Provider:   Ralene Burger, MD    We recommend signing up for the patient portal called "MyChart".  Sign up information is provided on this After Visit Summary.  MyChart is used to connect with patients for Virtual Visits (Telemedicine).  Patients are able to view lab/test results, encounter notes, upcoming appointments, etc.  Non-urgent messages can be sent to your provider as well.   To learn more about what you can do with MyChart, go to [email or web address].   Other Instructions None

## 2024-05-24 NOTE — Progress Notes (Addendum)
 " Cardiology Office Note:    Date:  05/24/2024   ID:  Norma Lopez, DOB 13-Mar-1961, MRN [id number]  PCP:  Ryan Powell HERO., MD  Cardiologist:  Lamar Fitch, MD    Referring MD: Ryan Powell HERO., MD   No chief complaint on file.   History of Present Illness:    Norma Lopez is a 63 y.o. female past medical history significant for sarcoidosis with pulmonary involvement, PVCs suppressed with beta-blocker, essential hypertension, diabetes, pericardial effusion, hypothyroidism.  Comes today to months for follow-up overall she is doing well.  Still complain of weakness fatigue tiredness but she is painting and rental apartment and have no difficulty doing it years she have to rest a little more frequently but overall I have submitted she is doing quite well.  Recently she got CT of her chest done there is still description of mild pericardial effusion.  No signs and symptoms of tamponade  Past Medical History:  Diagnosis Date   Adnexal mass 10/15/2015   Age-related nuclear cataract of both eyes 01/13/2016   Allergy    Anal sphincter tear 05/12/2023   Arthropathy of cervical facet joint 01/30/2019   Formatting of this note might be different from the original. Added automatically from request for surgery [id number]   ASTHMA 04/20/2008   Qualifier: Diagnosis of   By: Brien MD, Belvie BRAVO     Replacing diagnoses that were inactivated after the 03/29/23 regulatory import     Atypical chest pain 12/04/2020   Bilateral carpal tunnel syndrome 01/13/2016   Body aches 11/22/2023   Breast tenderness in female 03/16/2023   Chronic dryness of both eyes 01/13/2016   Chronic obstructive pulmonary disease (HCC) 02/03/2021   Cognitive complaints with normal neuropsychological exam 01/13/2016   Complete rupture of left proximal hamstring tendon 12/02/2020   Formatting of this note might be different from the original. Added automatically from request for surgery [id number]   Conjunctival concretions of both  eyes 01/13/2016   Conjunctival hemorrhage of right eye 06/17/2016   Cough 04/26/2008   Qualifier: Diagnosis of  By: Orlie NP, Tammy     Daytime somnolence 01/13/2016   Diabetes mellitus type 2, controlled (HCC) 01/13/2016   Diverticulosis    Dysphagia 07/27/2016   Dyspnea on exertion 01/13/2016   Dysrhythmia 2015   none since 2015   Edema 01/13/2016   Elevated liver enzymes 11/02/2017   Episodic tension-type headache, not intractable 03/10/2018   Essential hypertension 10/15/2015   Family history of colon cancer    Fatigue 01/13/2016   Gait disorder 07/30/2016   Generalized anxiety disorder 12/28/1988   GERD 04/20/2008   Qualifier: Diagnosis of   By: Wonda CMA, Lori         History of colonic polyps    History of posttraumatic stress disorder (PTSD) 07/05/2022   History of varicose veins 01/13/2016   HYPERLIPIDEMIA 04/20/2008   Qualifier: Diagnosis of  By: Brien MD, Belvie BRAVO    Hypothyroidism 12/17/2023   Hypoxemia 01/13/2016   IBS (irritable bowel syndrome)    Immunocompromised state (HCC) 08/18/2017   Ischial bursitis of left side 03/10/2023   Joint pain, knee 01/13/2016   Left groin hernia 01/13/2016   Leukopenia 11/15/2018   Low back pain without sciatica 07/30/2016   Lumbar herniated disc    Major depression, recurrent, full remission (HCC) 07/05/2022   Multiple lung nodules on CT    Neck pain 07/30/2016   Neuromuscular dysfunction of bladder, unspecified 03/10/2023   Nocturnal hypoxemia 04/20/2008  patient reports using CPAP machine at night Formatting of this note might be different from the original. Overview:  Qualifier: Diagnosis of  By: Neysa MD, Reggy BIRCH  Formatting of this note might be different from the original. Overview:  Qualifier: Diagnosis of  By: Brien MD, Belvie FORBES Deal of this note might be different from the original. 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 Overview:  Qualifier: Diagnosis of  By: Brien MD, Belvie FORBES   Precordial chest pain 08/26/2018   Premature ventricular contraction 02/02/2011   none since 2015 Formatting of this note might be different from the original. Overview:  Qualifier: Diagnosis of  By: Via LPN, Macario Pies 01/13/2016   Presbyopia 06/17/2016   PTSD (post-traumatic stress disorder)    Pulmonary fibrosis, unspecified (HCC) 01/13/2016   Restless leg syndrome    S/P left knee arthroscopy 03/05/2020   Sarcoidosis    Severe episode of recurrent major depressive disorder, without psychotic features (HCC) 01/13/2016   Situational anxiety 01/15/2016   Sleep apnea    patient reports using CPAP machine at night   Spondylosis without myelopathy or radiculopathy, thoracic region 01/30/2019   Formatting of this note might be different from the original. Added automatically from request for surgery [phone]  Formatting of this note might be different from the original. Added automatically from request for surgery [id number] Formatting of this note might be different from the original. Added automatically from request for surgery [id number]   Steroid-induced diabetes (HCC) 07/01/2016   SUI (stress urinary incontinence, female) 06/21/2023   Symptomatic menopausal or female climacteric states 01/13/2016   Thoracic back sprain 11/04/2017   Thyroid nodule    Vaccine counseling 09/16/2023   Vitamin D deficiency 01/13/2016    Past Surgical History:  Procedure Laterality Date   BIOPSY  08/06/2022   Procedure: BIOPSY;  Surgeon: Charlanne Groom, MD;  Location: [facility];  Service: Gastroenterology;;   BREAST EXCISIONAL BIOPSY Left  1985, 2006   Benign   CHOLECYSTECTOMY     COLONOSCOPY  06/23/2017   Colonic polyp status post polypectomy. Pancolonic diverticulosis predominantly in the sigmoid colon'   COLONOSCOPY WITH PROPOFOL  N/A 08/06/2022   Procedure: COLONOSCOPY WITH PROPOFOL ;  Surgeon: Charlanne Groom, MD;  Location: [facility];  Service: Gastroenterology;  Laterality: N/A;   ESOPHAGOGASTRODUODENOSCOPY  07/25/2008   Healed esophageal erosions. Irregular Z-line suggestive of gastroesophageal reflux (biopsied to rule out short-segment Barrett's esophagus) Mild gastritis.   ESOPHAGOGASTRODUODENOSCOPY (EGD) WITH PROPOFOL  N/A 08/06/2022   Procedure: ESOPHAGOGASTRODUODENOSCOPY (EGD) WITH PROPOFOL ;  Surgeon: Charlanne Groom, MD;  Location: [facility];  Service: Gastroenterology;  Laterality: N/A;   HAMSTRING SURGERY  11/2020   ANCHOR BOLT    KNEE ARTHROSCOPY     x3   KNEE ARTHROSCOPY WITH MEDIAL MENISECTOMY Left 02/26/2020   Procedure: KNEE ARTHROSCOPY WITH MEDIAL MENISECTOMY;  Surgeon: Rubie Kemps, MD;  Location: [facility];  Service: Orthopedics;  Laterality: Left;   MALONEY DILATION  08/06/2022   Procedure: AGAPITO DILATION;  Surgeon: Charlanne Groom, MD;  Location: [facility];  Service: Gastroenterology;;  POLYPECTOMY  08/06/2022   Procedure: POLYPECTOMY;  Surgeon: Charlanne Groom, MD;  Location: [facility];  Service: Gastroenterology;;   TONSILLECTOMY     TOTAL ABDOMINAL HYSTERECTOMY  1992   TUBAL LIGATION     veins stripped  Left 1989    Current Medications: Current Meds  Medication Sig   acetaminophen  (TYLENOL ) 500 MG tablet Take 500-1,000 mg by mouth every 6 (six) hours as needed for moderate pain or mild pain.   Adalimumab 40 MG/0.4ML PSKT Inject 40 mg into the skin once a week. Patient reports once weekly injections   albuterol  (VENTOLIN  HFA) 108 (90 Base) MCG/ACT inhaler Inhale 2 puffs into the lungs every 6 (six) hours as needed for wheezing or shortness of breath.   Biotin 10 MG TABS Take 10 mg by mouth daily  in the afternoon.   budesonide (PULMICORT) 0.5 MG/2ML nebulizer solution Take 0.5 mg by nebulization daily.   Cholecalciferol (VITAMIN D-3 PO) Take 2,000 Units by mouth daily.   Coenzyme Q10 100 MG capsule Take 100 mg by mouth daily.   estradiol  (CLIMARA  - DOSED IN MG/24 HR) 0.075 mg/24hr patch Place 1 patch (0.075 mg total) onto the skin once a week. Please specify directions, refills and quantity   famotidine  (PEPCID ) 20 MG tablet Take 20 mg by mouth daily.   L-Methylfolate-Algae (DEPLIN 15 PO) Take 1 capsule by mouth daily.   lisinopril (ZESTRIL) 2.5 MG tablet Take 2.5 mg by mouth at bedtime.   Magnesium Oxide 250 MG TABS Take 250 mg by mouth at bedtime.   Multiple Vitamins-Minerals (BARIATRIC MULTIVITAMINS/IRON PO) Take 1 tablet by mouth daily in the afternoon.   Omega-3 Fatty Acids (FISH OIL ULTRA) 1400 MG CAPS Take 2,000 mg by mouth 2 (two) times daily.   OXYGEN Inhale 1 L into the lungs at bedtime.   OZEMPIC, 1 MG/DOSE, 4 MG/3ML SOPN Inject 1 mg into the skin once a week.   pantoprazole  (PROTONIX ) 40 MG tablet Take 1 tablet (40 mg total) by mouth daily. Please call [phone] to schedule an office visit for more refills   pramipexole (MIRAPEX) 0.5 MG tablet Take 0.5 mg by mouth at bedtime.   rosuvastatin (CRESTOR) 10 MG tablet Take 10 mg by mouth at bedtime.   SYNTHROID 125 MCG tablet Take 125 mcg by mouth daily.     Allergies:   Latex, Nickel, Infliximab, Lamotrigine, Pregabalin, Azithromycin, and Wellbutrin [bupropion]   Social History   Socioeconomic History   Marital status: Married    Spouse name: Not on file   Number of children: Not on file   Years of education: Not on file   Highest education level: Not on file  Occupational History    Employer: [employer]   Occupation: RN  Tobacco Use   Smoking status: Former    Current packs/day: 0.00    Types: Cigarettes    Start date: 12/28/1990    Quit date: 12/28/1996    Years since quitting: 27.4   Smokeless tobacco: Never    Tobacco comments:    Exposed to second hand smoke  Vaping Use   Vaping status: Never Used  Substance and Sexual Activity   Alcohol use: No   Drug use: No   Sexual activity: Yes    Partners: Male    Birth control/protection: Surgical    Comment: 1st intercourse- 14, partners- 5, hysterectomy  Other Topics Concern   Not on file  Social History Narrative   Married with 3 children   3-4 cups caffeine per day  Exercises 2-3 times per week   Social Drivers of Health   Financial Resource Strain: Low Risk  (12/04/2021)   Received from [facility] visits prior to 02/27/2023., [facility] visits prior to 02/27/2023.   Overall Financial Resource Strain (CARDIA)    Difficulty of Paying Living Expenses: Not hard at all  Food Insecurity: Low Risk  (02/29/2024)   [facility]   Hunger Vital Sign    Worried About Running Out of Food in the Last Year: Never true    Ran Out of Food in the Last Year: Never true  Transportation Needs: No Transportation Needs (02/29/2024)   Received from [facility]    In the past 12 months, has lack of reliable transportation kept you from medical appointments, meetings, work or from getting things needed for daily living? : No  Physical Activity: Insufficiently Active (12/04/2021)   Received from [facility] visits prior to 02/27/2023., [facility] visits prior to 02/27/2023.   Exercise Vital Sign    Days of Exercise per Week: 2 days    Minutes of Exercise per Session: 10 min  Stress: Stress Concern Present (12/04/2021)   [facility] prior to 02/27/2023., [facility] visits prior to 02/27/2023.   [employer] of Occupational Health - Occupational Stress Questionnaire    Feeling of Stress : To some extent  Social Connections: Socially Integrated (12/04/2021)   Received from [facility] visits prior to 02/27/2023., [facility] visits prior to 02/27/2023.   Social Advertising Account Executive [NHANES]    Frequency of Communication with Friends and Family: More than three times a week    Frequency of Social Gatherings with Friends and Family: Twice a week    Attends Religious Services: 1 to 4 times per year    Active Member of [employer] or Organizations: Yes    Attends Banker Meetings: 1 to 4 times per year    Marital Status: Married     Family History: The patient's family history includes Alcohol abuse in her father; Allergies in an other family member; Anxiety disorder in her mother and another family member; Arthritis in her mother and another family member; Asthma in her sister and sister; Breast cancer (age of onset: 15) in her sister; COPD in her father and mother; Cancer in her father; Cancer (age of onset: 58) in her sister; Depression in her mother; Diabetes in her father and sister; Heart attack in her father; Heart disease in her father; Hyperlipidemia in an other family member; Obesity in an other family member; Sleep apnea in her sister. ROS:   Please see the history of present illness.    All 14 point review of systems negative except as described per history of present illness  EKGs/Labs/Other Studies Reviewed:         Recent Labs: No results found for requested labs within last 365 days.  Recent Lipid Panel No results found for: CHOL, TRIG, HDL, CHOLHDL, VLDL, LDLCALC, LDLDIRECT  Physical Exam:    VS:  BP 110/70   Pulse 70   Ht 5' 4 (1.626 m)   Wt 216 lb (98 kg)   SpO2 95%   BMI 37.08 kg/m     Wt Readings from Last 3 Encounters:  05/24/24 216 lb (98 kg)  01/21/24 228 lb 12.8 oz (103.8  kg)  12/17/23 219 lb (99.3 kg)     GEN:  Well nourished, well developed in no acute distress HEENT: Normal NECK: No JVD; No carotid bruits LYMPHATICS: No lymphadenopathy CARDIAC: RRR, no  murmurs, no rubs, no gallops RESPIRATORY:  Clear to auscultation without rales, wheezing or rhonchi  ABDOMEN: Soft, non-tender, non-distended MUSCULOSKELETAL:  No edema; No deformity  SKIN: Warm and dry LOWER EXTREMITIES: no swelling NEUROLOGIC:  Alert and oriented x 3 PSYCHIATRIC:  Normal affect   ASSESSMENT:    1. Pericardial effusion   2. Essential hypertension   3. Sarcoidosis    PLAN:    In order of problems listed above:  Pericardial effusion.  Asymptomatic no evidence of tamponade.  She is doing well.  I will repeat echocardiogram in about 2 months limited study just to look at degree of pericardial effusion.  Previously explanation for her pericardial effusion was hypothyroidism as well as autoimmune problem.  Will wait for results of echocardiogram. Essential hypertension blood pressure well-controlled continue present management. Sarcoidosis followed by pulmonary.   Medication Adjustments/Labs and Tests Ordered: Current medicines are reviewed at length with the patient today.  Concerns regarding medicines are outlined above.  No orders of the defined types were placed in this encounter.  Medication changes: No orders of the defined types were placed in this encounter.   Signed, Lamar DOROTHA Fitch, MD, [facility] 05/24/2024 1:26 PM    [facility]  "

## 2024-07-05 ENCOUNTER — Telehealth: Payer: Self-pay | Admitting: Gastroenterology

## 2024-07-05 MED ORDER — PANTOPRAZOLE SODIUM 40 MG PO TBEC: 40.0000 mg | DELAYED_RELEASE_TABLET | Freq: Every day | ORAL | 0 refills | Status: DC

## 2024-07-05 NOTE — Telephone Encounter (Signed)
 Done

## 2024-07-05 NOTE — Telephone Encounter (Signed)
 Inbound call from patient requesting a refill for protonix . Patient is currently scheduled for 7/23. Please advise, thank you

## 2024-07-19 ENCOUNTER — Ambulatory Visit: Admitting: Gastroenterology

## 2024-07-25 ENCOUNTER — Ambulatory Visit: Attending: Cardiology

## 2024-07-25 DIAGNOSIS — D869 Sarcoidosis, unspecified: Secondary | ICD-10-CM

## 2024-07-25 DIAGNOSIS — I3139 Other pericardial effusion (noninflammatory): Secondary | ICD-10-CM

## 2024-07-25 DIAGNOSIS — I10 Essential (primary) hypertension: Secondary | ICD-10-CM

## 2024-07-25 LAB — ECHOCARDIOGRAM LIMITED
Area-P 1/2: 3.06 cm2
S' Lateral: 2.7 cm

## 2024-07-27 ENCOUNTER — Ambulatory Visit: Payer: Self-pay | Admitting: Cardiology

## 2024-08-14 ENCOUNTER — Encounter: Payer: Self-pay | Admitting: Gastroenterology

## 2024-08-14 ENCOUNTER — Ambulatory Visit (INDEPENDENT_AMBULATORY_CARE_PROVIDER_SITE_OTHER): Admitting: Gastroenterology

## 2024-08-14 VITALS — BP 138/70 | HR 76 | Ht 60.75 in | Wt 206.1 lb

## 2024-08-14 DIAGNOSIS — K581 Irritable bowel syndrome with constipation: Secondary | ICD-10-CM | POA: Diagnosis not present

## 2024-08-14 DIAGNOSIS — K2100 Gastro-esophageal reflux disease with esophagitis, without bleeding: Secondary | ICD-10-CM | POA: Diagnosis not present

## 2024-08-14 DIAGNOSIS — M9905 Segmental and somatic dysfunction of pelvic region: Secondary | ICD-10-CM

## 2024-08-14 MED ORDER — PANTOPRAZOLE SODIUM 40 MG PO TBEC: 40.0000 mg | DELAYED_RELEASE_TABLET | Freq: Every day | ORAL | 0 refills | Status: AC

## 2024-08-14 MED ORDER — FAMOTIDINE 20 MG PO TABS: 20.0000 mg | ORAL_TABLET | Freq: Two times a day (BID) | ORAL | 2 refills | Status: DC

## 2024-08-14 NOTE — Progress Notes (Signed)
 Chief Complaint:follow-up Primary GI Doctor:Dr. Charlanne  HPI:  Patient is a  63  year old female patient with past medical history of GERD with eso dysphagia, H/O colonic polyps, IBS-C, who presents for follow-up Patient last seen in GI office on 05/22/22 by Dr. Charlanne for follow-up.  Interval History    Patient presents for follow-up on GERD and constipation.Patien taking Pepcid  20mg  in the morning and Pantoprazole  40mg  in the evening. She has had increased water brash and cough over last few weeks with recent increase in Ozempic dosage. Patient admits to snacking before bedtime. Patient denies dysphagia    Patient also has history of chronic constipation which is managed with high fiber diet, stool softeners prn, and OTC Miralax po prn She has had rectal sphincter repair in 06/2023. She has had some pelvic floor dysfunction and does pelvic floor exercises which helps for the occasional urgent incontinence.  Medical updates: Vertical sleeve in December 2023 Pelvic floor sling and rectal sphincter repair 06/2023 Patient diagnosed with hypothyroidism last fall, placed on medication with dose adjustments.  Patient started on Ozempic for weight loss, recently increased dose about two weeks ago.    Past GI procedures: Colonoscopy 07/2022: One 6 mm polyp in the rectum, removed with a cold snare. Resected and retrieved. Pancolonic diverticulosis predominantly in the sigmoid colon. Non- bleeding internal hemorrhoids. The examined portion of the ileum was normal. The examination was otherwise normal on direct and retroflexion views. Path: 1HPP EGD 07/2022:Minimal hiatal hernia. Otherwise normal EGD. S/ p empiric esophageal dilatation. Path: STOMACH, BODY, ANTRUM, BIOPSY:  - Antral and oxyntic mucosa with no significant pathologic changes.  - No Helicobacter pylori identified.   B. ESOPHAGUS, BIOPSY:  - Unremarkable squamous mucosa.  - No eosinophilic esophagitis.   Colonoscopy 05/2017: colonic  polyp s/p polypectomy (TA), pancolonic diverticulosis   EGD 06/2008: Healed distal esophageal erosions, small HH, irregular Z-line with neg bx for Barrett's, mild gastritis. S/P dil 48Fr. Neg SB Bx for celiac, neg CLO, neg eso Bx   Wt Readings from Last 3 Encounters:  08/14/24 206 lb 2 oz (93.5 kg)  05/24/24 216 lb (98 kg)  01/21/24 228 lb 12.8 oz (103.8 kg)      Past Medical History:  Diagnosis Date   Adnexal mass 10/15/2015   Age-related nuclear cataract of both eyes 01/13/2016   Allergy    Anal sphincter tear 05/12/2023   Arthropathy of cervical facet joint 01/30/2019   Formatting of this note might be different from the original. Added automatically from request for surgery [id number]   ASTHMA 04/20/2008   Qualifier: Diagnosis of   By: Brien MD, Belvie BRAVO     Replacing diagnoses that were inactivated after the 03/29/23 regulatory import     Atypical chest pain 12/04/2020   Bilateral carpal tunnel syndrome 01/13/2016   Body aches 11/22/2023   Breast tenderness in female 03/16/2023   Chronic dryness of both eyes 01/13/2016   Chronic obstructive pulmonary disease (HCC) 02/03/2021   Cognitive complaints with normal neuropsychological exam 01/13/2016   Complete rupture of left proximal hamstring tendon 12/02/2020   Formatting of this note might be different from the original. Added automatically from request for surgery [id number]   Conjunctival concretions of both eyes 01/13/2016   Conjunctival hemorrhage of right eye 06/17/2016   Cough 04/26/2008   Qualifier: Diagnosis of  By: Orlie NP, Tammy     Daytime somnolence 01/13/2016   Diabetes mellitus type 2, controlled (HCC) 01/13/2016   Diverticulosis  Dysphagia 07/27/2016   Dyspnea on exertion 01/13/2016   Dysrhythmia 2015   none since 2015   Edema 01/13/2016   Elevated liver enzymes 11/02/2017   Episodic tension-type headache, not intractable 03/10/2018   Essential hypertension 10/15/2015   Family history of colon cancer     Fatigue 01/13/2016   Gait disorder 07/30/2016   Generalized anxiety disorder 12/28/1988   GERD 04/20/2008   Qualifier: Diagnosis of   By: Wonda CMA, Katheryn         History of colonic polyps    History of posttraumatic stress disorder (PTSD) 07/05/2022   History of varicose veins 01/13/2016   HYPERLIPIDEMIA 04/20/2008   Qualifier: Diagnosis of  By: Brien MD, Belvie BRAVO    Hypothyroidism 12/17/2023   Hypoxemia 01/13/2016   IBS (irritable bowel syndrome)    Immunocompromised state (HCC) 08/18/2017   Ischial bursitis of left side 03/10/2023   Joint pain, knee 01/13/2016   Left groin hernia 01/13/2016   Leukopenia 11/15/2018   Low back pain without sciatica 07/30/2016   Lumbar herniated disc    Major depression, recurrent, full remission (HCC) 07/05/2022   Multiple lung nodules on CT    Neck pain 07/30/2016   Neuromuscular dysfunction of bladder, unspecified 03/10/2023   Nocturnal hypoxemia 04/20/2008   patient reports using CPAP machine at night Formatting of this note might be different from the original. Overview:  Qualifier: Diagnosis of  By: Neysa MD, Reggy BIRCH  Formatting of this note might be different from the original. Overview:  Qualifier: Diagnosis of  By: Brien MD, Belvie BRAVO Deal of this note might be different from the original. 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 Overview:  Qualifier: Diagnosis of  By: Brien MD, Belvie BRAVO   Precordial chest pain 08/26/2018   Premature ventricular contraction 02/02/2011   none since 2015 Formatting of this note might be different from the original.  Overview:  Qualifier: Diagnosis of  By: Via LPN, Macario Pies 01/13/2016   Presbyopia 06/17/2016   PTSD (post-traumatic stress disorder)    Pulmonary fibrosis, unspecified (HCC) 01/13/2016   Restless leg syndrome    S/P left knee arthroscopy 03/05/2020   Sarcoidosis    Severe episode of recurrent major depressive disorder, without psychotic features (HCC) 01/13/2016   Situational anxiety 01/15/2016   Sleep apnea    patient reports using CPAP machine at night   Spondylosis without myelopathy or radiculopathy, thoracic region 01/30/2019   Formatting of this note might be different from the original. Added automatically from request for surgery [id number]  Formatting of this note might be different from the original. Added automatically from request for surgery [id number] Formatting of this note might be different from the original. 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 Pancolonic diverticulosis predominantly in the sigmoid colon'   COLONOSCOPY WITH PROPOFOL  N/A 08/06/2022   Procedure: COLONOSCOPY WITH PROPOFOL ;  Surgeon: Charlanne Groom, MD;  Location: [facility];  Service: Gastroenterology;  Laterality: N/A;   ESOPHAGOGASTRODUODENOSCOPY  07/25/2008   Healed esophageal erosions. Irregular Z-line suggestive of gastroesophageal reflux (biopsied to rule out short-segment Barrett's  esophagus) Mild gastritis.   ESOPHAGOGASTRODUODENOSCOPY (EGD) WITH PROPOFOL  N/A 08/06/2022   Procedure: ESOPHAGOGASTRODUODENOSCOPY (EGD) WITH PROPOFOL ;  Surgeon: Charlanne Groom, MD;  Location: [facility];  Service: Gastroenterology;  Laterality: N/A;   HAMSTRING SURGERY  11/2020   ANCHOR BOLT    KNEE ARTHROSCOPY     x3   KNEE ARTHROSCOPY WITH MEDIAL MENISECTOMY Left 02/26/2020   Procedure: KNEE ARTHROSCOPY WITH MEDIAL MENISECTOMY;  Surgeon: Rubie Kemps, MD;  Location: [facility];  Service: Orthopedics;  Laterality: Left;   MALONEY DILATION  08/06/2022   Procedure: MALONEY DILATION;  Surgeon: Charlanne Groom, MD;  Location: [facility];  Service: Gastroenterology;;   PELVIC FLOOR REPAIR     Lift with baldder sling and rectal sphincter repair   POLYPECTOMY  08/06/2022   Procedure: POLYPECTOMY;  Surgeon: Charlanne Groom, MD;  Location: [facility];  Service: Gastroenterology;;   TONSILLECTOMY     TOTAL ABDOMINAL HYSTERECTOMY  1992   TUBAL LIGATION     veins stripped  Left 1989    Current Outpatient Medications  Medication Sig Dispense Refill   acetaminophen  (TYLENOL ) 500 MG tablet Take 500-1,000 mg by mouth every 6 (six) hours as needed for moderate pain or mild pain.     albuterol  (VENTOLIN  HFA) 108 (90 Base) MCG/ACT inhaler Inhale 2 puffs into the lungs every 6 (six) hours as needed for wheezing or shortness of breath.     budesonide (PULMICORT) 0.5 MG/2ML nebulizer solution Take 0.5 mg by nebulization daily.     Cholecalciferol (VITAMIN D-3 PO) Take 2,000 Units by mouth daily.     Coenzyme Q10 100 MG capsule Take 100 mg by mouth daily.     estradiol  (CLIMARA  - DOSED IN MG/24 HR) 0.075 mg/24hr patch Place 1 patch (0.075 mg total) onto the skin once a week. Please specify directions, refills and quantity 12 patch 0   estradiol  (ESTRACE ) 0.1 MG/GM vaginal cream Place 1 Applicatorful vaginally 3 (three) times a week.     famotidine  (PEPCID ) 20 MG tablet Take 20 mg by mouth daily.      L-Methylfolate-Algae (DEPLIN 15 PO) Take 1 capsule by mouth daily.     loratadine (CLARITIN) 10 MG tablet Take 10 mg by mouth as needed.     Magnesium Oxide 250 MG TABS Take 250 mg by mouth at bedtime.     Multiple Vitamins-Minerals (BARIATRIC MULTIVITAMINS/IRON PO) Take 1 tablet by mouth daily in the afternoon.     Omega-3 Fatty Acids (FISH OIL ULTRA) 1400 MG CAPS Take 2,000 mg by mouth 2 (two) times daily.     OXYGEN Inhale 1 L into the lungs at bedtime. With CPAP     OZEMPIC, 2 MG/DOSE, 8 MG/3ML SOPN Inject 2 mg into the skin every 7 (seven) days.     pantoprazole  (PROTONIX ) 40 MG tablet Take 1 tablet (40 mg total) by mouth daily. Please call  [phone] to schedule an office visit for more refills 90 tablet 0   pramipexole (MIRAPEX) 0.5 MG tablet Take 0.5 mg by mouth at bedtime.     Probiotic Product (PROBIOTIC PO) Take 1 capsule by mouth daily.     rosuvastatin (CRESTOR) 10 MG tablet Take 10 mg by mouth at bedtime.  1   SYNTHROID 125 MCG tablet Take 125 mcg by mouth daily.     XELJANZ 5 MG TABS Take 1 tablet by mouth at bedtime.     lisinopril (ZESTRIL) 2.5 MG tablet Take 2.5 mg by mouth at bedtime. (Patient not taking: Reported on 08/14/2024)     No current facility-administered medications for this visit.    Allergies as of 08/14/2024 - Review Complete 08/14/2024  Allergen Reaction Noted   Latex Itching, Rash, and Other (See Comments) 07/11/2012   Nickel Other (See Comments) and Rash 07/08/2023   Infliximab Other (See Comments) 06/09/2019   Lamotrigine Other (See Comments) 08/22/2023   Pregabalin  01/30/2022   Azithromycin Palpitations 02/09/2018   Wellbutrin [bupropion] Palpitations 02/09/2018    Family History  Problem Relation Age of Onset   COPD Mother    Arthritis Mother    Depression Mother    Anxiety disorder Mother    COPD Father    Alcohol abuse Father    Heart disease Father    Diabetes Father    Cancer Father        lung   Heart attack Father    Asthma  Sister    Cancer Sister 75       colon   Asthma Sister    Diabetes Sister    Breast cancer Sister 38   Sleep apnea Sister    Hyperlipidemia Other    Allergies Other    Obesity Other    Anxiety disorder Other    Arthritis Other     Review of Systems:    Constitutional: No weight loss, fever, chills, weakness or fatigue HEENT: Eyes: No change in vision               Ears, Nose, Throat:  No change in hearing or congestion Skin: No rash or itching Cardiovascular: No chest pain, chest pressure or palpitations   Respiratory: No SOB or cough Gastrointestinal: See HPI and otherwise negative Genitourinary: No dysuria or change in urinary frequency Neurological: No headache, dizziness or syncope Musculoskeletal: No new muscle or joint pain Hematologic: No bleeding or bruising Psychiatric: No history of depression or anxiety    Physical Exam:  Vital signs: BP 138/70 (BP Location: Left Arm, Patient Position: Sitting, Cuff Size: Normal)   Pulse 76   Ht 5' 0.75 (1.543 m) Comment: height measured without shoes  Wt 206 lb 2 oz (93.5 kg)   BMI 39.27 kg/m   Constitutional:   Pleasant female appears to be in NAD, Well developed, Well nourished, alert and cooperative Throat: Oral cavity and pharynx without inflammation, swelling or lesion.  Respiratory: Respirations even and unlabored. Lungs clear to auscultation bilaterally.   No wheezes, crackles, or rhonchi.  Cardiovascular: Normal S1, S2. Regular rate and rhythm. No peripheral edema, cyanosis or pallor.  Gastrointestinal:  Soft, nondistended, nontender. No rebound or guarding. Normal bowel sounds. No appreciable masses or hepatomegaly. Rectal:  Not performed.  Msk:  Symmetrical without gross deformities. Without edema, no deformity or joint abnormality.  Neurologic:  Alert and  oriented x4;  grossly normal neurologically.  Skin:   Dry and intact without significant lesions or rashes.  RELEVANT  LABS AND IMAGING: CBC    Latest Ref  Rng & Units 05/26/2021    1:00 PM 10/09/2020   12:02 PM 05/08/2020   11:23 AM  CBC  WBC 4.0 - 10.5 K/uL 4.1  6.3  3.7   Hemoglobin 12.0 - 15.0 g/dL 85.4  85.5  85.2   Hematocrit 36.0 - 46.0 % 43.9  42.6  44.2   Platelets 150 - 400 K/uL 251  317.0  290.0      CMP     Latest Ref Rng & Units 05/26/2021    1:00 PM 12/10/2020    2:33 PM 04/30/2020    3:37 PM  CMP  Glucose 70 - 99 mg/dL 882  84  93   BUN 6 - 20 mg/dL 15  15  16    Creatinine 0.44 - 1.00 mg/dL 9.10  9.12  9.11   Sodium 135 - 145 mmol/L 137  142  138   Potassium 3.5 - 5.1 mmol/L 3.9  4.1  3.9   Chloride 98 - 111 mmol/L 103  104  105   CO2 22 - 32 mmol/L 27  26  27    Calcium 8.9 - 10.3 mg/dL 9.1  8.8  8.7   Total Protein 6.5 - 8.1 g/dL 7.2   6.6   Total Bilirubin 0.3 - 1.2 mg/dL 0.4   0.3   Alkaline Phos 38 - 126 U/L 102   60   AST 15 - 41 U/L 36   19   ALT 0 - 44 U/L 50   20    06/2024 echo- Left ventricular ejection fraction, by estimation, is 60 to 65%.   Assessment: Encounter Diagnoses  Name Primary?   Gastroesophageal reflux disease with esophagitis without hemorrhage Yes   Irritable bowel syndrome with constipation     63 year old female patient who presents for follow-up.  Patient has history of GERD, currently with no esophageal dysphagia.  Patient taking famotidine  in the morning and pantoprazole  in the evening.  Will add Pepcid  at bedtime due to increase in symptoms.  Reinforced patient avoiding late time meals or snacking.  If no improvement in symptoms Yi Haugan consider increasing PPI therapy to twice daily.  Patient on Ozempic for weight loss which will also help.     Patient with history of chronic constipation that is well-managed with high-fiber diet and over-the-counter stool softeners and MiraLAX as needed.      Patient has also had issues with pelvic floor dysfunction and repair of anal sphincter.  With management of her constipation and pelvic floor therapy this has helped manage her symptoms.  Patient up to  date on colonoscopy, next due August 2033.  Plan: -Recommend GERD diet, no late meals -Pepcid  20 mg 1 tablet before breakfast, refilled script -Pantoprazole  40 mg 30-45 minutes before supper, refilled script -Pepcid   20 mg at bedtime -Continue high fiber diet -Continue stool softeners as needed -Continue miralax po daily as needed -Continue pelvic floor exercises  Thank you for the courtesy of this consult. Please call me with any questions or concerns.   Yurem Viner, FNP-C [facility] 08/14/2024, 10:32 AM  Cc: Spry, Heather M., MD

## 2024-08-14 NOTE — Patient Instructions (Addendum)
 GERD Recommend GERD diet, no late meals Pepcid  20 mg 1 tablet before breakfast, refilled script Pantoprazole  40 mg 30-45 minutes before supper, refilled script Pepcid   20 mg at bedtime  Constipation Continue high fiber diet Continue stool softeners as needed Continue miralax po daily as needed Continue pelvic floor exercises   _______________________________________________________  If your blood pressure at your visit was 140/90 or greater, please contact your primary care physician to follow up on this.  _______________________________________________________  If you are age 62 or older, your body mass index should be between 23-30. Your Body mass index is 39.27 kg/m. If this is out of the aforementioned range listed, please consider follow up with your Primary Care Provider.  If you are age 62 or younger, your body mass index should be between 19-25. Your Body mass index is 39.27 kg/m. If this is out of the aformentioned range listed, please consider follow up with your Primary Care Provider.   ________________________________________________________  The [facility] providers would like to encourage you to use MYCHART to communicate with providers for non-urgent requests or questions.  Due to long hold times on the telephone, sending your provider a message by [facility] may be a faster and more efficient way to get a response.  Please allow 48 business hours for a response.  Please remember that this is for non-urgent requests.  _______________________________________________________  [facility] is using a team-based approach to care.  Your team is made up of your doctor and two to three APPS. Our APPS (Nurse Practitioners and Physician Assistants) work with your physician to ensure care continuity for you. They are fully qualified to address your health concerns and develop a treatment plan. They communicate directly with your gastroenterologist to care for you. Seeing  the Advanced Practice Practitioners on your physician's team can help you by facilitating care more promptly, often allowing for earlier appointments, access to diagnostic testing, procedures, and other specialty referrals.   Thank you for trusting me with your gastrointestinal care. Deanna May, FNP-C

## 2024-09-18 ENCOUNTER — Other Ambulatory Visit: Payer: Self-pay

## 2024-09-18 NOTE — Progress Notes (Signed)
 " Cardiology Office Note   Date:  09/19/2024  ID:  Norma Lopez, DOB [birth year], MRN [id number] PCP: Norma Powell HERO., MD  [facility] Providers Cardiologist:  Norma Fitch, MD     History of Present Illness Norma Lopez is a 63 y.o. female with a past medical history of hypertension, history of pericardial effusion, COPD, OSA on CPAP, pulmonary fibrosis, sarcoidosis follows [facility], GERD, IBS, DM2, hypothyroidism, dyslipidemia.  07/25/2024 echo EF 60-65%, no evidence of pericardial effusion 02/22/2024 echo EF 60 to 65%, pericardial effusion reported as small 01/14/2024 echo EF 60 to 65%, grade 1 DD, moderate circumferential pericardial effusion without evidence of tamponade 12/12/2020 coronary CT calcium score of 0 09/09/2018 stress echo no evidence for stress-induced ischemia 10/03/2018 monitor normal Holter monitor with only 4 PACs and 17 PVCs  [facility] with [facility] in 2017 further evaluation of syncope.  In 2019 she was evaluated with concerns of chest pain, she is also following closely with pulmonology for sarcoidosis.  At that time a stress echo was arranged which was negative for stress-induced ischemia.  2021 she underwent a coronary CTA revealing a calcium score of 0.  Most recently in December 2024 she had had a CT of her chest revealing a small pericardial effusion, repeat echocardiogram revealed it was small to moderate, her thyroid disease was not well-controlled and this was felt to be a contributing factor.  She presents today at the behest of her PCP, she has been running intermittent fevers over the last 6 or 7 days, evaluated by her PCP with normal CRP/sed rate/rheumatoid factor however she is also on Xeljanz.  She is not necessarily having ongoing chest pain or worsening shortness of breath however her PCP wanted her to be evaluated for possibility of recurrent pleural effusion/pericarditis/endocarditis.  Her EKG is without any changes.  Does mention on a  few occasions she had left-sided chest pain when she would lay down at night starting under her left breast wrapping around to her back, we inspected the area did not see any concerning rashes, and it has not bothered her for the last few nights, she thinks that possibly could have been gas. She denies chest pain, palpitations,  pnd, orthopnea, n, v, dizziness, syncope, edema, weight gain, or early satiety.   ROS: Review of Systems  Constitutional:  Positive for fever and malaise/fatigue.  Respiratory:  Positive for shortness of breath.   All other systems reviewed and are negative.    Studies Reviewed EKG Interpretation Date/Time:  Tuesday September 19 2024 11:16:28 EDT Ventricular Rate:  91 PR Interval:  188 QRS Duration:  90 QT Interval:  354 QTC Calculation: 435 R Axis:   -5  Text Interpretation: Normal sinus rhythm Low voltage QRS Confirmed by Norma Lopez [phone]) on 09/19/2024 11:24:27 AM    Cardiac Studies & Procedures   ______________________________________________________________________________________________   STRESS TESTS  ECHOCARDIOGRAM STRESS TEST 09/09/2018  Narrative *[facility]* [address] [place], [place] [address] [phone]  ------------------------------------------------------------------- Stress Echocardiography  Patient:    Norma Lopez MR #:       [id number] Study Date: 09/09/2018 Gender:     F Age:        57 Height:     167.6 cm Weight:     84.4 kg BSA:        2.01 m^2 Pt. Status: Room:  ATTENDING    Norma Fitch, MD ORDERING     Norma Fitch, MD REFERRING  Norma Fitch, MD SONOGRAPHER  Norma Lopez, RDCS PERFORMING   [facility], Norma Lopez  cc:  -------------------------------------------------------------------  ------------------------------------------------------------------- Indications:      Chest pain 786.51.  ------------------------------------------------------------------- History:   PMH:    Syncope.  Risk factors:  Hypertension. Dyslipidemia.  ------------------------------------------------------------------- Study Conclusions  - Stress ECG conclusions: There were no stress arrhythmias or conduction abnormalities. The stress ECG was negative for ischemia. - Staged echo: There was no echocardiographic evidence for stress-induced ischemia.  Impressions:  - Good exercise tolerance No chest pain. No evidence of ischemia base on ECG and echo criteria. Overall it is NEGATIVE stress test for exercise induced ischemia.  ------------------------------------------------------------------- Study data:   Study status:  Routine.  Consent:  The risks, benefits, and alternatives to the procedure were explained to the patient and informed consent was obtained.  Procedure:  The patient reported no pain pre or post test. Initial setup. The patient was brought to the laboratory. A baseline ECG was recorded. Surface ECG leads and automatic cuff blood pressure measurements were monitored. Treadmill exercise testing was performed using the Bruce protocol. The patient exercised for 9 min 23 sec, to protocol stage 4, to a maximal work rate of 11.3 mets. Exercise was terminated due to achievement of target heart rate. The patient was positioned for image acquisition and recovery monitoring. Transthoracic stress echocardiography for chest pain evaluation. Image quality was good. Images were captured at baseline and peak exercise.  Study completion:  The patient tolerated the procedure well. There were no complications.          Bruce protocol. Stress echocardiography. Birthdate:  Patient birthdate: 11-11-1961.  Age:  Patient is 63 yr old.  Sex:  Gender: female.    BMI: 30 kg/m^2.  Blood pressure: 100/62  Patient status:  Outpatient.  Study date:  Study date: 09/09/2018. Study time: 08:41  AM.  -------------------------------------------------------------------  ------------------------------------------------------------------- Baseline ECG:  Normal.  ------------------------------------------------------------------- Stress protocol:  +--------+---------------+---+------------+--------+ !Stage   !Time into phase!HR !BP (mmHg)   !Symptoms! +--------+---------------+---+------------+--------+ !Baseline!---------------!80 !127/81 (96) !None    ! +--------+---------------+---+------------+--------+ !Stage 1 !---------------!99 !155/75 (102)!--------! +--------+---------------+---+------------+--------+ !Stage 2 !---------------!883!852/25 (98) !--------! +--------+---------------+---+------------+--------+ !Stage 3 !---------------!130!158/78 (105)!--------! +--------+---------------+---+------------+--------+ !Stage 4 !---------------!101!------------!--------! +--------+---------------+---+------------+--------+ !Recovery!9 min 48 sec   !83 !132/80 (97) !--------! +--------+---------------+---+------------+--------+  ------------------------------------------------------------------- Stress results:   Maximal heart rate during stress was 130 bpm (79% of maximal predicted heart rate). The maximal predicted heart rate was 164 bpm.The target heart rate was achieved. The heart rate response to stress was normal. There was a normal resting blood pressure with an appropriate response to stress. The rate-pressure product for the peak heart rate and blood pressure was 79459 mm Hg/min.  The patient experienced no chest pain during stress.  ------------------------------------------------------------------- Stress ECG:  There were no stress arrhythmias or conduction abnormalities.  The stress ECG was negative for ischemia.  ------------------------------------------------------------------- Baseline:  - The estimated LV ejection fraction was 55%. - Normal wall motion;  no LV regional wall motion abnormalities.  Peak stress:  - The estimated LV ejection fraction was 65%. - Normal wall motion; no LV regional wall motion abnormalities.  ------------------------------------------------------------------- Stress echo results:     Left ventricular ejection fraction was normal at rest and with stress. There was no echocardiographic evidence for stress-induced ischemia.  ------------------------------------------------------------------- Measurements  Left ventricle                         Value        Reference LV ID, ED, PLAX chordal        (  L)     39    mm     43 - 52 LV ID, ES, PLAX chordal                28    mm     23 - 38 LV fx shortening, PLAX chordal (L)     28    %      >=29 LV PW thickness, ED                    13    mm     --------- IVS/LV PW ratio, ED                    0.92         <=1.3  Ventricular septum                     Value        Reference IVS thickness, ED                      12    mm     ---------  Aorta                                  Value        Reference Aortic root ID, ED                     32    mm     ---------  Left atrium                            Value        Reference LA ID, A-P, ES                         41    mm     --------- LA ID/bsa, A-P                         2.04  cm/m^2 <=2.2  Tricuspid valve                        Value        Reference Tricuspid regurg peak velocity         208   cm/s   --------- Tricuspid peak RV-RA gradient          17    mm Hg  ---------  Legend: (L)  and  (H)  mark values outside specified reference range.  ------------------------------------------------------------------- Prepared and Electronically Authenticated by  Norma Fitch, MD 2019-09-13T15:30:23   ECHOCARDIOGRAM  ECHOCARDIOGRAM LIMITED 07/25/2024  Narrative ECHOCARDIOGRAM LIMITED REPORT    Patient Name:   Norma Lopez [facility] Date of Exam: 07/25/2024 Medical Rec #:  [id number]     Height:       64.0  in Accession #:    [id number]    Weight:       216.0 lb Date of Birth:  [birth year]     BSA:          2.021 m Patient Age:    63 years      BP:           144/80 mmHg Patient Gender:  F             HR:           74 bpm. Exam Location:  [facility]  Procedure: Limited Echo, Cardiac Doppler and Color Doppler (Both Spectral and Color Flow Doppler were utilized during procedure).  Indications:    Pericardial effusion [I31.39 (ICD-10-CM)]; Essential hypertension [I10 (ICD-10-CM)]; Sarcoidosis [D86.9 (ICD-10-CM)]  History:        Patient has prior history of Echocardiogram examinations, most recent 02/22/2024. COPD, Signs/Symptoms:Dyspnea; Risk Factors:Hypertension.  Sonographer:    Charlie Jointer RDCS Referring Phys: [id number] ROBERT J KRASOWSKI  IMPRESSIONS   1. Left ventricular ejection fraction, by estimation, is 60 to 65%. The left ventricle has normal function. The left ventricle has no regional wall motion abnormalities. Left ventricular diastolic parameters are consistent with Grade I diastolic dysfunction (impaired relaxation). The average left ventricular global longitudinal strain is -19.4 %. The global longitudinal strain is normal. 2. Right ventricular systolic function is normal. The right ventricular size is normal. 3. The mitral valve is normal in structure. No evidence of mitral valve regurgitation. No evidence of mitral stenosis. 4. The aortic valve is normal in structure. Aortic valve regurgitation is not visualized. No aortic stenosis is present. 5. The inferior vena cava is normal in size with greater than 50% respiratory variability, suggesting right atrial pressure of 3 mmHg.  FINDINGS Left Ventricle: Left ventricular ejection fraction, by estimation, is 60 to 65%. The left ventricle has normal function. The left ventricle has no regional wall motion abnormalities. The average left ventricular global longitudinal strain is -19.4 %. Strain was performed and the global  longitudinal strain is normal. The left ventricular internal cavity size was normal in size. There is no left ventricular hypertrophy. Left ventricular diastolic parameters are consistent with Grade I diastolic dysfunction (impaired relaxation).  Right Ventricle: The right ventricular size is normal. No increase in right ventricular wall thickness. Right ventricular systolic function is normal.  Left Atrium: Left atrial size was normal in size.  Right Atrium: Right atrial size was normal in size.  Pericardium: There is no evidence of pericardial effusion.  Mitral Valve: The mitral valve is normal in structure. No evidence of mitral valve stenosis.  Tricuspid Valve: The tricuspid valve is normal in structure. Tricuspid valve regurgitation is not demonstrated. No evidence of tricuspid stenosis.  Aortic Valve: The aortic valve is normal in structure. Aortic valve regurgitation is not visualized. No aortic stenosis is present.  Pulmonic Valve: The pulmonic valve was normal in structure. Pulmonic valve regurgitation is not visualized. No evidence of pulmonic stenosis.  Aorta: The aortic root is normal in size and structure.  Venous: The inferior vena cava is normal in size with greater than 50% respiratory variability, suggesting right atrial pressure of 3 mmHg.  IAS/Shunts: No atrial level shunt detected by color flow Doppler.  LEFT VENTRICLE PLAX 2D LVIDd:         4.50 cm   Diastology LVIDs:         2.70 cm   LV e' medial:    6.20 cm/s LV PW:         1.00 cm   LV E/e' medial:  7.8 LV IVS:        1.00 cm   LV e' lateral:   6.85 cm/s LVOT diam:     2.00 cm   LV E/e' lateral: 7.1 LV SV:         73 LV SV Index:   36  2D Longitudinal Strain LVOT Area:     3.14 cm  2D Strain GLS Avg:     -19.4 %   RIGHT VENTRICLE             IVC RV Basal diam:  3.20 cm     IVC diam: 1.40 cm RV Mid diam:    2.70 cm RV S prime:     15.50 cm/s TAPSE (M-mode): 2.7 cm  LEFT ATRIUM              Index        RIGHT ATRIUM           Index LA diam:        3.60 cm 1.78 cm/m   RA Area:     15.20 cm LA Vol (A2C):   38.7 ml 19.14 ml/m  RA Volume:   34.20 ml  16.92 ml/m LA Vol (A4C):   39.2 ml 19.39 ml/m LA Biplane Vol: 38.0 ml 18.80 ml/m AORTIC VALVE LVOT Vmax:   112.50 cm/s LVOT Vmean:  73.600 cm/s LVOT VTI:    0.231 m  AORTA Ao Root diam: 3.50 cm Ao Asc diam:  3.00 cm  MITRAL VALVE MV Area (PHT): 3.06 cm    SHUNTS MV Decel Time: 248 msec    Systemic VTI:  0.23 m MV E velocity: 48.53 cm/s  Systemic Diam: 2.00 cm MV A velocity: 74.07 cm/s MV E/A ratio:  0.66  Deauna Yaw Revankar MD Electronically signed by Bettyjane Shenoy Crape MD Signature Date/Time: 07/25/2024/12:32:07 PM    Final    MONITORS  LONG TERM MONITOR-LIVE TELEMETRY (3-14 DAYS) 12/08/2023  Narrative Patch Wear Time:  13 days and 22 hours (2024-11-15T09:15:50-0500 to 2024-11-29T07:55:57-0500)  Patient had a min HR of 45 bpm, max HR of 96 bpm, and avg HR of 61 bpm. Predominant underlying rhythm was Sinus Rhythm. First Degree AV Block was present. Slight P wave morphology changes were noted. Isolated SVEs were rare (<1.0%), SVE Couplets were rare (<1.0%), and no SVE Triplets were present. Isolated VEs were rare (<1.0%), and no VE Couplets or VE Triplets were present.  Summary and conclusions: First-degree AV block otherwise normal monitor   CT SCANS  CT CORONARY MORPH W/CTA COR W/SCORE 12/12/2020  Addendum 12/12/2020  4:42 PM ADDENDUM REPORT: 12/12/2020 16:39  EXAM: OVER-READ INTERPRETATION  CT CHEST  The following report is an over-read performed by radiologist Dr. Jackquline Skates [facility] on 12/12/2020. This over-read does not include interpretation of cardiac or coronary anatomy or pathology. The coronary CTA interpretation by the cardiologist is attached.  COMPARISON:  None.  FINDINGS: Limited view of the lung parenchyma demonstrates multiple pulmonary nodules with a central  predominance. There is peribronchovascular thickening extending from the LEFT and RIGHT hilum. The degree of nodularity is reduced from CT 11/04/2015. Airways are normal.  Limited view of the mediastinum demonstrates mild mediastinal lymphadenopathy similar to comparison CT 2016. For example 20 mm RIGHT lower paratracheal node compares to 20 mm. Esophagus normal.  Limited view of the upper abdomen unremarkable.  Limited view of the skeleton and chest wall is unremarkable.  IMPRESSION: Extensive pulmonary nodularity and peribronchovascular thickening with mediastinal adenopathy. Some improvement in nodularity from CT 2016. Findings are consistent with reported history of sarcoidosis.  These results will be called to the ordering clinician or representative by the Radiologist Assistant, and communication documented in the PACS or [employer].   Electronically Signed By: Jackquline Boxer M.D. On: 12/12/2020 16:39  Narrative CLINICAL DATA:  chestpain  EXAM: Cardiac/Coronary  CTA  TECHNIQUE: The patient was scanned on a Siemens Somatoform go.Top scanner.  FINDINGS: A retrospective scan was triggered in the descending thoracic aorta. Axial non-contrast 3 mm slices were carried out through the heart. The data set was analyzed on a dedicated work station and scored using the Agatson method. Gantry rotation speed was 330 msecs and collimation was .6 mm. 25mg  of metoprolol  and 0.8 mg of sl NTG was given. The 3D data set was reconstructed in 5% intervals of the 60-95 % of the R-R cycle. Diastolic phases were analyzed on a dedicated work station using MPR, MIP and VRT modes. The patient received 100 cc of contrast.  Aorta:  Normal size.  No calcifications.  No dissection.  Aortic Valve:  Trileaflet.  No calcifications.  Coronary Arteries:  Normal coronary origin.  Right dominance.  RCA is a large dominant artery that gives rise to PDA and PLA. There is no plaque.  Left  main is a large artery that gives rise to LAD and LCX arteries.  LAD is a large vessel that has no plaque. It gives rise to a large first diagonal branch with no disease.  LCX is a non-dominant artery that gives rise to one large obtuse marginal branch. There is no plaque.  Other findings:  Normal pulmonary vein drainage into the left atrium.  Normal left atrial appendage without a thrombus.  Normal size of the pulmonary artery.  IMPRESSION: 1. Normal coronary calcium score of 0. Patient is low risk for coronary events.  2. Normal coronary origin with right dominance.  3. No evidence of CAD.  4. CAD-RADS 0. Consider non-atherosclerotic causes of chest pain.  Electronically Signed: By: Redell Cave M.D. On: 12/12/2020 15:21     ______________________________________________________________________________________________      Risk Assessment/Calculations           Physical Exam VS:  BP 110/70 (BP Location: Left Arm, Patient Position: Sitting, Cuff Size: Large)   Pulse 91   Resp 16   Ht 5' 5 (1.651 m)   Wt 200 lb 9.6 oz (91 kg)   SpO2 95%   BMI 33.38 kg/m        Wt Readings from Last 3 Encounters:  09/19/24 200 lb 9.6 oz (91 kg)  08/14/24 206 lb 2 oz (93.5 kg)  05/24/24 216 lb (98 kg)    GEN: Well nourished, well developed in no acute distress NECK: No JVD; No carotid bruits CARDIAC: RRR, no murmurs, rubs, gallops RESPIRATORY:  Clear to auscultation without rales, wheezing or rhonchi  ABDOMEN: Soft, non-tender, non-distended EXTREMITIES:  No edema; No deformity   ASSESSMENT AND PLAN History of pericardial effusion-most recent echocardiogram revealed no effusion, will repeat limited echo.  Intermittent fevers-she is following closely with her PCP regarding this, they have mention referring her to infectious disease specialist.  Sed rate, CRP, rheumatoid factor are all negative- she is on Xeljanz though.  Sarcoidosis-she follows with [facility], will see  them next month.  Dyslipidemia-currently on Crestor 10 mg daily, appears to be monitored by PCP.  Hypothyroidism-currently on Synthroid 125 mcg daily, she reports her most recent thyroid panel was normal.       Dispo: Limited echo, follow-up in 3 months.  Signed, Delon JAYSON Hoover, NP  "

## 2024-09-19 ENCOUNTER — Encounter: Payer: Self-pay | Admitting: Cardiology

## 2024-09-19 ENCOUNTER — Ambulatory Visit: Attending: Cardiology | Admitting: Cardiology

## 2024-09-19 VITALS — BP 110/70 | HR 91 | Resp 16 | Ht 65.0 in | Wt 200.6 lb

## 2024-09-19 DIAGNOSIS — R509 Fever, unspecified: Secondary | ICD-10-CM | POA: Diagnosis not present

## 2024-09-19 DIAGNOSIS — E039 Hypothyroidism, unspecified: Secondary | ICD-10-CM

## 2024-09-19 DIAGNOSIS — E782 Mixed hyperlipidemia: Secondary | ICD-10-CM

## 2024-09-19 DIAGNOSIS — R0609 Other forms of dyspnea: Secondary | ICD-10-CM | POA: Diagnosis not present

## 2024-09-19 DIAGNOSIS — I10 Essential (primary) hypertension: Secondary | ICD-10-CM | POA: Diagnosis not present

## 2024-09-19 DIAGNOSIS — I3139 Other pericardial effusion (noninflammatory): Secondary | ICD-10-CM | POA: Diagnosis not present

## 2024-09-19 DIAGNOSIS — D869 Sarcoidosis, unspecified: Secondary | ICD-10-CM

## 2024-09-19 NOTE — Patient Instructions (Signed)
 Medication Instructions:  Your physician recommends that you continue on your current medications as directed. Please refer to the Current Medication list given to you today.  *If you need a refill on your cardiac medications before your next appointment, please call your pharmacy*  Lab Work: NONE If you have labs (blood work) drawn today and your tests are completely normal, you will receive your results only by: MyChart Message (if you have MyChart) OR A paper copy in the mail If you have any lab test that is abnormal or we need to change your treatment, we will call you to review the results.  Testing/Procedures: Your physician has requested that you have an echocardiogram. Echocardiography is a painless test that uses sound waves to create images of your heart. It provides your doctor with information about the size and shape of your heart and how well your heart's chambers and valves are working. This procedure takes approximately one hour. There are no restrictions for this procedure. Please do NOT wear cologne, perfume, aftershave, or lotions (deodorant is allowed). Please arrive 15 minutes prior to your appointment time.  Please note: We ask at that you not bring children with you during ultrasound (echo/ vascular) testing. Due to room size and safety concerns, children are not allowed in the ultrasound rooms during exams. Our front office staff cannot provide observation of children in our lobby area while testing is being conducted. An adult accompanying a patient to their appointment will only be allowed in the ultrasound room at the discretion of the ultrasound technician under special circumstances. We apologize for any inconvenience.   Follow-Up: At [facility], you and your health needs are our priority.  As part of our continuing mission to provide you with exceptional heart care, our providers are all part of one team.  This team includes your primary Cardiologist  (physician) and Advanced Practice Providers or APPs (Physician Assistants and Nurse Practitioners) who all work together to provide you with the care you need, when you need it.  Your next appointment:   3 month(s)  Provider:   Lamar Fitch, MD    We recommend signing up for the patient portal called MyChart.  Sign up information is provided on this After Visit Summary.  MyChart is used to connect with patients for Virtual Visits (Telemedicine).  Patients are able to view lab/test results, encounter notes, upcoming appointments, etc.  Non-urgent messages can be sent to your provider as well.   To learn more about what you can do with MyChart, go to [email or web address].   Other Instructions

## 2024-10-12 ENCOUNTER — Ambulatory Visit: Attending: Cardiology

## 2024-10-12 DIAGNOSIS — R0609 Other forms of dyspnea: Secondary | ICD-10-CM | POA: Diagnosis not present

## 2024-10-12 LAB — ECHOCARDIOGRAM LIMITED
AR max vel: 2.48 cm2
AV Area VTI: 2.58 cm2
AV Area mean vel: 2.38 cm2
AV Mean grad: 4.2 mmHg
AV Peak grad: 8 mmHg
Ao pk vel: 1.41 m/s
Area-P 1/2: 3.15 cm2
S' Lateral: 3.2 cm

## 2024-10-13 ENCOUNTER — Ambulatory Visit: Payer: Self-pay | Admitting: Cardiology

## 2024-10-13 NOTE — Telephone Encounter (Signed)
 Patient is following up to schedule 1 month follow-up with Dr. Krasowski as instructed, but I don't see any openings prior to December. Please advise.

## 2024-10-16 ENCOUNTER — Other Ambulatory Visit: Payer: Self-pay

## 2024-10-16 MED ORDER — COLCHICINE (CARDIOVASCULAR) 0.5 MG PO TABS: 0.5000 mg | ORAL_TABLET | Freq: Two times a day (BID) | ORAL | 0 refills | Status: DC

## 2024-10-26 ENCOUNTER — Telehealth: Payer: Self-pay | Admitting: Pharmacy Technician

## 2024-10-26 ENCOUNTER — Other Ambulatory Visit (HOSPITAL_COMMUNITY): Payer: Self-pay

## 2024-10-26 NOTE — Telephone Encounter (Signed)
  Ran test claim for colchicine 0.6mg . For a 30 day supply and the co-pay is 3.08 . PA is not needed at this time.  Per test claim-On colchicine 0.5mg  trying for pa  Tried to do pa for colchicine 0.5mg  and it said: (sent message to staff)

## 2024-10-27 MED ORDER — COLCHICINE 0.6 MG PO TABS: 0.6000 mg | ORAL_TABLET | Freq: Every day | ORAL | 0 refills | Status: DC

## 2024-10-27 NOTE — Telephone Encounter (Signed)
 Prescription has been sent in.   Thanks! Carly

## 2024-11-11 ENCOUNTER — Other Ambulatory Visit: Payer: Self-pay | Admitting: Gastroenterology

## 2024-11-11 DIAGNOSIS — K2100 Gastro-esophageal reflux disease with esophagitis, without bleeding: Secondary | ICD-10-CM

## 2024-11-13 MED ORDER — COLCHICINE 0.6 MG PO TABS: 0.6000 mg | ORAL_TABLET | Freq: Every day | ORAL | 1 refills | Status: DC

## 2024-11-20 ENCOUNTER — Ambulatory Visit: Admitting: Cardiology

## 2024-11-20 ENCOUNTER — Other Ambulatory Visit: Payer: Self-pay

## 2024-12-15 ENCOUNTER — Ambulatory Visit: Admitting: Cardiology

## 2024-12-19 ENCOUNTER — Ambulatory Visit: Attending: Cardiology | Admitting: Cardiology

## 2024-12-19 ENCOUNTER — Encounter: Payer: Self-pay | Admitting: Cardiology

## 2024-12-19 VITALS — BP 120/86 | HR 85 | Ht 65.0 in | Wt 186.0 lb

## 2024-12-19 DIAGNOSIS — I10 Essential (primary) hypertension: Secondary | ICD-10-CM

## 2024-12-19 DIAGNOSIS — I3139 Other pericardial effusion (noninflammatory): Secondary | ICD-10-CM

## 2024-12-19 DIAGNOSIS — I493 Ventricular premature depolarization: Secondary | ICD-10-CM

## 2024-12-19 NOTE — Progress Notes (Signed)
 " Cardiology Office Note:    Date:  12/19/2024   ID:  Norma Lopez, DOB 07-Aug-1961, MRN [id number]  PCP:  Ryan Powell HERO., MD  Cardiologist:  Lamar Fitch, MD    Referring MD: Ryan Powell HERO., MD   Chief Complaint  Patient presents with   Follow-up  Doing fine  History of Present Illness:    Norma Lopez is a 63 y.o. female past medical history significant for sarcoidosis with pulmonary involvement, PVCs, suppressed with beta-blocker, essential hypertension, diabetes, pericardial fusion, hypothyroidism, fever of unknown origin.  She is being evaluated by multiple specialties try to figure out reason for her fever of unknown origin look like the final conclusion is that that was probably related to hyperthyroidism.  She does have pericardial effusion which is only small insignificant without evidence of tamponade.  Recently she was put on colchicine  by my PA.  She said overall she is doing fine lately denies have any chest pain tightness squeezing pressure burning chest.  Recently she was noted to have increased nodularity of her lungs meaning sarcoidosis probably progressing medication has been adjusted since that time she is doing fine  Past Medical History:  Diagnosis Date   Adnexal mass 10/15/2015   Age-related nuclear cataract of both eyes 01/13/2016   Allergy    Anal sphincter tear 05/12/2023   Arthropathy of cervical facet joint 01/30/2019   Formatting of this note might be different from the original. Added automatically from request for surgery [id number]   ASTHMA 04/20/2008   Qualifier: Diagnosis of   By: Brien MD, Belvie BRAVO     Replacing diagnoses that were inactivated after the 03/29/23 regulatory import     Atypical chest pain 12/04/2020   Bilateral carpal tunnel syndrome 01/13/2016   Body aches 11/22/2023   Breast tenderness in female 03/16/2023   Chronic dryness of both eyes 01/13/2016   Chronic obstructive pulmonary disease (HCC) 02/03/2021   Cognitive complaints  with normal neuropsychological exam 01/13/2016   Complete rupture of left proximal hamstring tendon 12/02/2020   Formatting of this note might be different from the original. Added automatically from request for surgery [id number]   Conjunctival concretions of both eyes 01/13/2016   Conjunctival hemorrhage of right eye 06/17/2016   Cough 04/26/2008   Qualifier: Diagnosis of  By: Orlie NP, Tammy     Daytime somnolence 01/13/2016   Diabetes mellitus type 2, controlled (HCC) 01/13/2016   Diverticulosis    Dysphagia 07/27/2016   Dyspnea on exertion 01/13/2016   Dysrhythmia 2015   none since 2015   Edema 01/13/2016   Elevated liver enzymes 11/02/2017   Episodic tension-type headache, not intractable 03/10/2018   Essential hypertension 10/15/2015   Family history of colon cancer    Fatigue 01/13/2016   Gait disorder 07/30/2016   Generalized anxiety disorder 12/28/1988   GERD 04/20/2008   Qualifier: Diagnosis of   By: Wonda CMA, Lori         History of colonic polyps    History of posttraumatic stress disorder (PTSD) 07/05/2022   History of varicose veins 01/13/2016   HYPERLIPIDEMIA 04/20/2008   Qualifier: Diagnosis of  By: Brien MD, Belvie BRAVO    Hypothyroidism 12/17/2023   Hypoxemia 01/13/2016   IBS (irritable bowel syndrome)    Immunocompromised state 08/18/2017   Ischial bursitis of left side 03/10/2023   Joint pain, knee 01/13/2016   Left groin hernia 01/13/2016   Leukopenia 11/15/2018   Low back pain without sciatica 07/30/2016   Lumbar herniated  disc    Major depression, recurrent, full remission 07/05/2022   Multiple lung nodules on CT    Neck pain 07/30/2016   Neuromuscular dysfunction of bladder, unspecified 03/10/2023   Nocturnal hypoxemia 04/20/2008   patient reports using CPAP machine at night Formatting of this note might be different from the original. Overview:  Qualifier: Diagnosis of  By: Neysa MD, Reggy BIRCH  Formatting of this note might be different from the  original. Overview:  Qualifier: Diagnosis of  By: Brien MD, Belvie FORBES Deal of this note might be different from the original. 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 Overview:  Qualifier: Diagnosis of  By: Brien MD, Belvie FORBES   Precordial chest pain 08/26/2018   Premature ventricular contraction 02/02/2011   none since 2015 Formatting of this note might be different from the original. Overview:  Qualifier: Diagnosis of  By: Via LPN, Macario Pies 01/13/2016   Presbyopia 06/17/2016   PTSD (post-traumatic stress disorder)    Pulmonary fibrosis, unspecified (HCC) 01/13/2016   Restless leg syndrome    S/P left knee arthroscopy 03/05/2020   Sarcoidosis    Severe episode of recurrent major depressive disorder, without psychotic features (HCC) 01/13/2016   Situational anxiety 01/15/2016   Sleep apnea    patient reports using CPAP machine at night   Spondylosis without myelopathy or radiculopathy, thoracic region 01/30/2019   Formatting of this note might be different from the original. Added automatically from request for surgery [phone]  Formatting of this note might be different from the original. Added automatically from request for surgery [phone] Formatting of this note might be different from the original. Added automatically from request for surgery [id number]   Steroid-induced diabetes 07/01/2016   SUI (stress urinary incontinence, female) 06/21/2023   Symptomatic menopausal or female climacteric states 01/13/2016   Thoracic back sprain 11/04/2017   Thyroid nodule    Vaccine counseling 09/16/2023   Vitamin D deficiency 01/13/2016    Past Surgical History:  Procedure Laterality  Date   BIOPSY  08/06/2022   Procedure: BIOPSY;  Surgeon: Charlanne Groom, MD;  Location: [facility];  Service: Gastroenterology;;   BREAST EXCISIONAL BIOPSY Left 1985, 2006   Benign   CHOLECYSTECTOMY     COLONOSCOPY  06/23/2017   Colonic polyp status post polypectomy. Pancolonic diverticulosis predominantly in the sigmoid colon'   COLONOSCOPY WITH PROPOFOL  N/A 08/06/2022   Procedure: COLONOSCOPY WITH PROPOFOL ;  Surgeon: Charlanne Groom, MD;  Location: [facility];  Service: Gastroenterology;  Laterality: N/A;   ESOPHAGOGASTRODUODENOSCOPY  07/25/2008   Healed esophageal erosions. Irregular Z-line suggestive of gastroesophageal reflux (biopsied to rule out short-segment Barrett's esophagus) Mild gastritis.   ESOPHAGOGASTRODUODENOSCOPY (EGD) WITH PROPOFOL  N/A 08/06/2022   Procedure: ESOPHAGOGASTRODUODENOSCOPY (EGD) WITH PROPOFOL ;  Surgeon: Charlanne Groom, MD;  Location: [facility];  Service: Gastroenterology;  Laterality: N/A;   HAMSTRING SURGERY  11/2020   ANCHOR BOLT    KNEE ARTHROSCOPY     x3   KNEE ARTHROSCOPY WITH MEDIAL MENISECTOMY Left 02/26/2020   Procedure: KNEE ARTHROSCOPY WITH MEDIAL MENISECTOMY;  Surgeon: Rubie Kemps, MD;  Location: [facility];  Service: Orthopedics;  Laterality: Left;   MALONEY DILATION  08/06/2022  Procedure: MALONEY DILATION;  Surgeon: Charlanne Groom, MD;  Location: [facility];  Service: Gastroenterology;;   PELVIC FLOOR REPAIR     Lift with baldder sling and rectal sphincter repair   POLYPECTOMY  08/06/2022   Procedure: POLYPECTOMY;  Surgeon: Charlanne Groom, MD;  Location: [facility];  Service: Gastroenterology;;   TONSILLECTOMY     TOTAL ABDOMINAL HYSTERECTOMY  1992   TUBAL LIGATION     veins stripped  Left 1989    Current Medications: Active Medications[1]   Allergies:   Latex, Nickel, Infliximab, Lamotrigine, Oxycodone hcl, Pregabalin, Azithromycin, and Wellbutrin [bupropion]   Social History   Socioeconomic History   Marital status: Married     Spouse name: Not on file   Number of children: Not on file   Years of education: Not on file   Highest education level: Not on file  Occupational History    Employer: [employer]   Occupation: RN  Tobacco Use   Smoking status: Former    Current packs/day: 0.00    Types: Cigarettes    Start date: 12/28/1990    Quit date: 12/28/1996    Years since quitting: 27.9   Smokeless tobacco: Never   Tobacco comments:    Exposed to second hand smoke  Vaping Use   Vaping status: Never Used  Substance and Sexual Activity   Alcohol use: No   Drug use: No   Sexual activity: Yes    Partners: Male    Birth control/protection: Surgical    Comment: 1st intercourse- 14, partners- 5, hysterectomy  Other Topics Concern   Not on file  Social History Narrative   Married with 3 children   3-4 cups caffeine per day   Exercises 2-3 times per week   Social Drivers of Health   Tobacco Use: Medium Risk (12/19/2024)   Patient History    Smoking Tobacco Use: Former    Smokeless Tobacco Use: Never    Passive Exposure: Not on file  Financial Resource Strain: Low Risk  (12/04/2021)   Received from [facility] visits prior to 02/27/2023.   Overall Financial Resource Strain (CARDIA)    Difficulty of Paying Living Expenses: Not hard at all  Food Insecurity: Low Risk (09/25/2024)   Received from [facility]   Epic    Within the past 12 months, you worried that your food would run out before you got money to buy more: Never true    Within the past 12 months, the food you bought just didn't last and you didn't have money to get more. : Never true  Transportation Needs: No Transportation Needs (09/25/2024)   Received from [facility]    In the past 12 months, has lack of reliable transportation kept you from medical appointments, meetings, work or from getting things needed for daily living? : No  Physical Activity: Insufficiently Active (12/04/2021)   Received from [facility] visits prior to 02/27/2023.   Exercise Vital Sign    On average, how many days per week do you engage in moderate to strenuous exercise (like a brisk walk)?: 2 days    On average, how many minutes do you engage in exercise at this level?: 10 min  Stress: Stress Concern Present (12/04/2021)   Received from [facility] visits prior to 02/27/2023.   [employer] of Occupational [employer] - Occupational Stress Questionnaire    Feeling of Stress : To some extent  Social Connections: Socially Integrated (  12/04/2021)   Received from [facility] visits prior to 02/27/2023.   Social Connection and Isolation Panel    In a typical week, how many times do you talk on the phone with family, friends, or neighbors?: More than three times a week    How often do you get together with friends or relatives?: Twice a week    How often do you attend church or religious services?: 1 to 4 times per year    Do you belong to any clubs or organizations such as church groups, unions, fraternal or athletic groups, or school groups?: Yes    How often do you attend meetings of the clubs or organizations you belong to?: 1 to 4 times per year    Are you married, widowed, divorced, separated, never married, or living with a partner?: Married  Depression (PHQ2-9): Not on file  Alcohol Screen: Not on file  Housing: Low Risk (09/25/2024)   Received from [facility]   Epic    What is your living situation today?: I have a steady place to live    Think about the place you live. Do you have problems with any of the following? Choose all that apply:: None/None on this list  Utilities: Low Risk (09/25/2024)   Received from [facility]   Utilities    In the past 12 months has the electric, gas, oil, or water company threatened to shut off services in your home? : No  Health Literacy: Not on file     Family History: The patient's family history includes Alcohol abuse  in her father; Allergies in an other family member; Anxiety disorder in her mother and another family member; Arthritis in her mother and another family member; Asthma in her sister and sister; Breast cancer (age of onset: 67) in her sister; COPD in her father and mother; Cancer in her father; Cancer (age of onset: 31) in her sister; Depression in her mother; Diabetes in her father and sister; Heart attack in her father; Heart disease in her father; Hyperlipidemia in an other family member; Obesity in an other family member; Sleep apnea in her sister. ROS:   Please see the history of present illness.    All 14 point review of systems negative except as described per history of present illness  EKGs/Labs/Other Studies Reviewed:         Recent Labs: No results found for requested labs within last 365 days.  Recent Lipid Panel No results found for: CHOL, TRIG, HDL, CHOLHDL, VLDL, LDLCALC, LDLDIRECT  Physical Exam:    VS:  BP 120/86   Pulse 85   Ht 5' 5 (1.651 m)   Wt 186 lb (84.4 kg)   SpO2 97%   BMI 30.95 kg/m     Wt Readings from Last 3 Encounters:  12/19/24 186 lb (84.4 kg)  09/19/24 200 lb 9.6 oz (91 kg)  08/14/24 206 lb 2 oz (93.5 kg)     GEN:  Well nourished, well developed in no acute distress HEENT: Normal NECK: No JVD; No carotid bruits LYMPHATICS: No lymphadenopathy CARDIAC: RRR, no murmurs, no rubs, no gallops RESPIRATORY:  Clear to auscultation without rales, wheezing or rhonchi  ABDOMEN: Soft, non-tender, non-distended MUSCULOSKELETAL:  No edema; No deformity  SKIN: Warm and dry LOWER EXTREMITIES: no swelling NEUROLOGIC:  Alert and oriented x 3 PSYCHIATRIC:  Normal affect   ASSESSMENT:    1. Pericardial effusion   2. Premature ventricular contraction   3. Essential hypertension  PLAN:    In order of problems listed above:  Pericardial effusion hemodynamically insignificant etiology still unclear could be related to thyroid, however  typically hypothyroidism because pericardial fusion not hyperthyroidism.  Will repeat echocardiogram in few months to recheck on that.  Likely no signs and symptoms of tamponade. Premature ventricular contraction this being controlled nicely. Essential hypertension blood pressure well-controlled. Fever of unknown origin conclusion was that this is probably related to hyperthyroidism.   Medication Adjustments/Labs and Tests Ordered: Current medicines are reviewed at length with the patient today.  Concerns regarding medicines are outlined above.  No orders of the defined types were placed in this encounter.  Medication changes: No orders of the defined types were placed in this encounter.   Signed, Lamar DOROTHA Fitch, MD, [facility] 12/19/2024 2:11 PM     [facility]     [1]  Current Meds  Medication Sig   acetaminophen  (TYLENOL ) 500 MG tablet Take 500-1,000 mg by mouth every 6 (six) hours as needed for moderate pain or mild pain.   albuterol  (VENTOLIN  HFA) 108 (90 Base) MCG/ACT inhaler Inhale 2 puffs into the lungs every 6 (six) hours as needed for wheezing or shortness of breath.   budesonide (PULMICORT FLEXHALER) 180 MCG/ACT inhaler Inhale 1 puff into the lungs 2 (two) times daily.   Cholecalciferol (VITAMIN D-3 PO) Take 2,000 Units by mouth daily.   Coenzyme Q10 100 MG capsule Take 100 mg by mouth daily.   colchicine  0.6 MG tablet Take 1 tablet (0.6 mg total) by mouth daily.   estradiol  (CLIMARA  - DOSED IN MG/24 HR) 0.075 mg/24hr patch Place 1 patch (0.075 mg total) onto the skin once a week. Please specify directions, refills and quantity   estradiol  (ESTRACE ) 0.1 MG/GM vaginal cream Place 1 Applicatorful vaginally 3 (three) times a week.   famotidine  (PEPCID ) 20 MG tablet Take 20 mg by mouth daily.   L-Methylfolate-Algae (DEPLIN 15 PO) Take 1 capsule by mouth daily.   levothyroxine (SYNTHROID) 75 MCG tablet Take 75 mcg by mouth daily before breakfast.    loratadine (CLARITIN) 10 MG tablet Take 10 mg by mouth as needed.   Magnesium Oxide 250 MG TABS Take 250 mg by mouth at bedtime.   Multiple Vitamins-Minerals (BARIATRIC MULTIVITAMINS/IRON PO) Take 1 tablet by mouth daily in the afternoon.   Omega-3 Fatty Acids (FISH OIL ULTRA) 1400 MG CAPS Take 2,000 mg by mouth 2 (two) times daily.   OXYGEN Inhale 1 L into the lungs at bedtime. With CPAP   OZEMPIC, 1 MG/DOSE, 4 MG/3ML SOPN Inject 1 mg into the skin once a week.   pantoprazole  (PROTONIX ) 40 MG tablet Take 1 tablet (40 mg total) by mouth daily. Please call [phone] to schedule an office visit for more refills   pramipexole (MIRAPEX) 0.5 MG tablet Take 0.5 mg by mouth at bedtime.   Probiotic Product (PROBIOTIC PO) Take 1 capsule by mouth daily.   rosuvastatin (CRESTOR) 10 MG tablet Take 10 mg by mouth at bedtime.   XELJANZ XR 11 MG TB24 Take 11 mg by mouth daily.   "

## 2024-12-19 NOTE — Patient Instructions (Signed)
 Medication Instructions:  Your physician recommends that you continue on your current medications as directed. Please refer to the Current Medication list given to you today.  *If you need a refill on your cardiac medications before your next appointment, please call your pharmacy*   Lab Work: None ordered If you have labs (blood work) drawn today and your tests are completely normal, you will receive your results only by: MyChart Message (if you have MyChart) OR A paper copy in the mail If you have any lab test that is abnormal or we need to change your treatment, we will call you to review the results.   Testing/Procedures: None ordered   Follow-Up: At [facility], you and your health needs are our priority.  As part of our continuing mission to provide you with exceptional heart care, we have created designated Provider Care Teams.  These Care Teams include your primary Cardiologist (physician) and Advanced Practice Providers (APPs -  Physician Assistants and Nurse Practitioners) who all work together to provide you with the care you need, when you need it.  We recommend signing up for the patient portal called "MyChart".  Sign up information is provided on this After Visit Summary.  MyChart is used to connect with patients for Virtual Visits (Telemedicine).  Patients are able to view lab/test results, encounter notes, upcoming appointments, etc.  Non-urgent messages can be sent to your provider as well.   To learn more about what you can do with MyChart, go to [email or web address].    Your next appointment:   4 month(s)  The format for your next appointment:   In Person  Provider:   Gypsy Balsam, MD    Other Instructions none  Important Information About Sugar

## 2024-12-20 ENCOUNTER — Other Ambulatory Visit: Payer: Self-pay | Admitting: Cardiology
